# Patient Record
Sex: Male | Born: 1975 | Race: Black or African American | Hispanic: No | Marital: Married | Smoking: Never smoker
Health system: Southern US, Community
[De-identification: ages and names within clinical notes are randomized; demographics above are authoritative.]

## PROBLEM LIST (undated history)

## (undated) DIAGNOSIS — I209 Angina pectoris, unspecified: Secondary | ICD-10-CM

## (undated) DIAGNOSIS — M1990 Unspecified osteoarthritis, unspecified site: Secondary | ICD-10-CM

## (undated) DIAGNOSIS — R002 Palpitations: Secondary | ICD-10-CM

## (undated) DIAGNOSIS — I639 Cerebral infarction, unspecified: Secondary | ICD-10-CM

## (undated) DIAGNOSIS — G243 Spasmodic torticollis: Secondary | ICD-10-CM

## (undated) DIAGNOSIS — E785 Hyperlipidemia, unspecified: Secondary | ICD-10-CM

## (undated) DIAGNOSIS — I10 Essential (primary) hypertension: Secondary | ICD-10-CM

## (undated) HISTORY — DX: Spasmodic torticollis: G24.3

## (undated) HISTORY — PX: FRACTURE SURGERY: SHX138

## (undated) HISTORY — DX: Palpitations: R00.2

## (undated) HISTORY — DX: Angina pectoris, unspecified: I20.9

## (undated) HISTORY — DX: Essential (primary) hypertension: I10

## (undated) HISTORY — DX: Hyperlipidemia, unspecified: E78.5

## (undated) HISTORY — DX: Cerebral infarction, unspecified: I63.9

---

## 2000-01-03 ENCOUNTER — Encounter: Payer: Self-pay | Admitting: Emergency Medicine

## 2000-01-03 ENCOUNTER — Emergency Department (HOSPITAL_COMMUNITY): Admission: EM | Admit: 2000-01-03 | Discharge: 2000-01-03 | Payer: Self-pay | Admitting: Emergency Medicine

## 2002-08-02 ENCOUNTER — Ambulatory Visit (HOSPITAL_BASED_OUTPATIENT_CLINIC_OR_DEPARTMENT_OTHER): Admission: RE | Admit: 2002-08-02 | Discharge: 2002-08-02 | Payer: Self-pay | Admitting: Orthopedic Surgery

## 2002-10-08 ENCOUNTER — Emergency Department (HOSPITAL_COMMUNITY): Admission: EM | Admit: 2002-10-08 | Discharge: 2002-10-09 | Payer: Self-pay | Admitting: Emergency Medicine

## 2002-12-28 ENCOUNTER — Encounter: Payer: Self-pay | Admitting: Orthopedic Surgery

## 2002-12-28 ENCOUNTER — Ambulatory Visit (HOSPITAL_COMMUNITY): Admission: RE | Admit: 2002-12-28 | Discharge: 2002-12-28 | Payer: Self-pay | Admitting: Orthopedic Surgery

## 2003-02-06 ENCOUNTER — Ambulatory Visit (HOSPITAL_BASED_OUTPATIENT_CLINIC_OR_DEPARTMENT_OTHER): Admission: RE | Admit: 2003-02-06 | Discharge: 2003-02-06 | Payer: Self-pay | Admitting: Orthopedic Surgery

## 2005-05-01 ENCOUNTER — Emergency Department (HOSPITAL_COMMUNITY): Admission: EM | Admit: 2005-05-01 | Discharge: 2005-05-02 | Payer: Self-pay | Admitting: Emergency Medicine

## 2008-12-25 ENCOUNTER — Ambulatory Visit (HOSPITAL_COMMUNITY): Admission: RE | Admit: 2008-12-25 | Discharge: 2008-12-25 | Payer: Self-pay | Admitting: Orthopedic Surgery

## 2009-02-05 ENCOUNTER — Ambulatory Visit (HOSPITAL_BASED_OUTPATIENT_CLINIC_OR_DEPARTMENT_OTHER): Admission: RE | Admit: 2009-02-05 | Discharge: 2009-02-05 | Payer: Self-pay | Admitting: Orthopedic Surgery

## 2009-04-18 ENCOUNTER — Ambulatory Visit (HOSPITAL_BASED_OUTPATIENT_CLINIC_OR_DEPARTMENT_OTHER): Admission: RE | Admit: 2009-04-18 | Discharge: 2009-04-18 | Payer: Self-pay | Admitting: Orthopedic Surgery

## 2010-05-22 ENCOUNTER — Emergency Department (HOSPITAL_COMMUNITY)
Admission: EM | Admit: 2010-05-22 | Discharge: 2010-05-22 | Payer: Self-pay | Source: Home / Self Care | Admitting: Emergency Medicine

## 2010-06-17 ENCOUNTER — Ambulatory Visit: Admission: RE | Admit: 2010-06-17 | Payer: Self-pay | Source: Home / Self Care | Admitting: Orthopedic Surgery

## 2010-09-10 LAB — POCT HEMOGLOBIN-HEMACUE: Hemoglobin: 14.6 g/dL (ref 13.0–17.0)

## 2010-09-13 LAB — POCT HEMOGLOBIN-HEMACUE: Hemoglobin: 14.9 g/dL (ref 13.0–17.0)

## 2010-10-21 NOTE — Op Note (Signed)
NAME:  CORAL, SOLER               ACCOUNT NO.:  [id number]   MEDICAL RECORD NO.:  [id number]          PATIENT TYPE:  AMB   LOCATION:  DSC                          FACILITY:  [facility]   PHYSICIAN:  Katy Fitch. Sypher, M.D. DATE OF BIRTH:  February 28, 1976   DATE OF PROCEDURE:  02/05/2009  DATE OF DISCHARGE:                               OPERATIVE REPORT   PREOPERATIVE DIAGNOSES:  1. Loose body, right wrist volar aspect of lunate facet.  2. Chronic triangular fibrocartilage tear.  3. Chronic ulnocarpal abutment signs.  4. 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  5. Possible loose retained Kirschner wire in scaphoid.   POSTOPERATIVE DIAGNOSES:  1. Loose body, volar aspect of right wrist and lunate facet involving      volar radioulnar ligament.  2. 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  3. 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  4. No evidence of an accessible Kirschner wire based on arthroscopic      examination of proximal pole of the scaphoid; therefore, no effort      to remove the Kirschner wire was undertaken.   OPERATIONS:  1. Diagnostic arthroscopy, right wrist.  2. Arthroscopic debridement of loose body.  3. Arthroscopic debridement of triangular fibrocartilage and      ulnocarpal, radiocarpal synovitis.  4. Open Feldon resection of distal ulna with arthroscopic shaving and      burring as well as synovectomy of distal radioulnar joint.  5. Examination of scaphoid looking for retained Kirschner wire.  The      wire was covered with hyaline cartilage; therefore, no effort was      made to remove the wire from the scaphoid.   OPERATING SURGEON:  Katy Fitch. Sypher, MD   ASSISTANT:  Marveen Reeks Dasnoit, PA-C   ANESTHESIA:  General by LMA.   SUPERVISING ANESTHESIOLOGIST:  Janetta Hora. 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   He has a history of fracturing his right scaphoid in the  past undergoing open reduction and internal fixation with Kirschner wire  fixation.  He was noncompliant with postoperative splinting and  fractured one of his 3 Kirschner wires.  One is retained within the body  of the scaphoid.  He also is status post arthroscopic debridement of his  wrist with a arthroscopic Feldon shortening.   He did quite well for 6 years but this year developed acute wrist pain.  He was seen in consultation by Dr. Charlett Blake in [facility]  where he was noted to have a loose body in his lunate facet.  He was  referred back for an upper extremity orthopedic consult.  Clinical  examination revealed signs of internal derangement of the wrist.  Plain  films showed a loose body adjacent to the lunate facet of the distal  radius.  We advised him to undergo a CT scan in an effort to determine  the exact location of the loose body and whether or not he had signs  of  chronic ulnocarpal abutment.   He had cystic change in the triquetrum and uneven ulnar head despite  prior Feldon resection suggested that he did have residual abutment  causing osteophyte formation.  He also had mechanical symptoms  suggestive of scapholunate interosseous ligament tear.  We recommended  diagnostic arthroscopy, removal of loose body, and after the CT  suggested we might be able to access the Kirschner wire.  We offered  possible wire removal from the scaphoid.   After informed consent, he is brought to the operating room at this  time.   PROCEDURE:  Sandra Tellefsen. Morlock was brought to the operating room and  placed in a supine position on the operating table.   Following an anesthesia consult with Dr. Gelene Mink, general anesthesia  by LMA technique was recommended and accepted.  He was brought to room  #6 at [facility] and placed in supine position on the  operating table and under Dr. Thornton Dales direct  supervision, general  anesthesia by LMA technique induced.   The right arm was prepped with Betadine soap and solution, sterilely  draped.  Due to CEPHALOSPORIN allergy, 1 g of Ancef was administered as  IV prophylactic antibiotic.   Procedure commenced with exsanguination of the right arm with an Esmarch  bandage and inflation of the arterial tourniquet to 250 mmHg on the  proximal brachium.  The wrist was extracted and a tower designed for  wrist arthroscopy with metal finger traps on the index and the long  finger and countertraction on the forearm, 10-pound traction was  applied.  The wrist was sounded with an 18-gauge needle, distended with  10 mL sterile saline and a scope introduced through a standard 3-portal  dorsal portal.  Diagnostic arthroscopy revealed very uneven hyaline  cartilage on the scaphoid, dorsal lunate, and triquetrum.  There was a  barrier on the triquetrum due to chronic abutment versus the ulna.  The  triangular fibrocartilage had central degenerative tear.  The volar,  ulnar corner of the lunate facet of the radius was unstable and  represented the loose body.   This was invested in the volar radiocarpal ligaments and the volar  radioulnar ligament.   With great care, the loose body was outlined with a nerve hook followed  by use of a suction shaver and suction bur to remove the loose body.  This is a subtotal resection taking care to leave a rim of loose body to  protect the volar radioulnar ligament.  We lowered the profile of the  loose body well below the height of the articular surface of the lunate  facet removing about 4 mm of bone.  However, I was careful not to  completely detach the volar radioulnar ligament from the radius, as this  could lead to distal radioulnar joint instability.  The triangular  fibrocartilage was meticulously debrided with a basket forceps and  suction shaver and complete synovectomy of the joint accomplished.  The   hyaline articular cartilage surface of the proximal pole of the scaphoid  had grade 2 and 3 chondromalacia.  The lunate had grade 2  chondromalacia.  The scaphoid facet of the radius was pristine.  The  lunate facet of the radius had grade 2 chondromalacia.  We carefully  inspected the proximal pole of the scaphoid and could not identify the  Kirschner wire.  This appears to be covered with hyaline cartilage and  must be clinically stable.   In view of these findings,  there was no indication to proceed with an  arthrotomy to attempt to remove the Kirschner wire from my judgment, as  this has been in this position for a very prolonged period of time.   The distal ulna was not easily visualized through the triangular  fibrocartilage tear; therefore, we elected to perform an open distal  ulnar resection.  After removal of the arthroscopic equipment, the ulnar  head was exposed through a transverse incision directly over the site of  the previous Feldon resection.  The interval between the fifth and sixth  dorsal compartments was incised transversely and the capsule of the  distal radioulnar joint incised.  Suction shaver was used to debride  synovitis followed by use of arthroscopic bur to level the ulnar head  and removed another 3 mm.  This was done in 30-degree oblique angle to  be certain that there is no residual abutment.   Care was taken to preserve the foveal attachment of the triangular  fibrocartilage.  We carefully resected bone in a manner to prevent  residual abutment.   The C-arm fluoroscope was used to confirm near-complete resection of the  loose body and satisfactory decompression of the distal ulna.  A level  resection was achieved.   The wound was then lavaged with the scope followed by repair of the  capsule of the distal radioulnar joint with a mattress suture of 3-0  Ethibond followed by repair of the skin with subcutaneous suture of 4-0  Vicryl and intradermal  3-0 Prolene.   There were no apparent complications.  Mr. Farrington tolerated the surgery  and anesthesia well.  He was transferred to the recovery room in stable  signs.      Katy Fitch Sypher, M.D.  Electronically Signed     RVS/MEDQ  D:  02/05/2009  T:  02/06/2009  Job:  [id number]   cc:   Lunette Stands, M.D.

## 2010-10-24 NOTE — Op Note (Signed)
NAME:  Jesse Ho, OBRECHT NO.:  [id number]   MEDICAL RECORD NO.:  [id number]                   PATIENT TYPE:  AMB   LOCATION:  DSC                                  FACILITY:  [facility]   PHYSICIAN:  Feliberto Gottron. Turner Daniels, M.D.                DATE OF BIRTH:  02-06-1976   DATE OF PROCEDURE:  08/02/2002  DATE OF DISCHARGE:                                 OPERATIVE REPORT   PREOPERATIVE DIAGNOSIS:  Right wrist triangular fibrocartilage tear.   POSTOPERATIVE DIAGNOSIS:  Right wrist triangular fibrocartilage tear.   OPERATION:  Right wrist arthroscopic debridement of triangular  fibrocartilage tear and minimal chondromalacia of the junction between the  lunate and scaphoid facets of the distal radius.  The TFCC tear was central.  We also removed a retained suture from a prior peripheral repair that looked  like it was in good condition.   SURGEON:  Feliberto Gottron. Turner Daniels, M.D.   FIRST ASSISTANT:  Erskine Squibb B. Jannet Mantis.   ANESTHESIA:  General LMA   ESTIMATED BLOOD LOSS:  Minimal   FLUIDS REPLACED:  800 cc of crystalloid   DRAINS PLACED:  None   TOURNIQUET TIME:  None   INDICATIONS FOR PROCEDURE:  A 35 year old man who injured his right wrist at  work when he slammed it down on a counter and has ulnar sided every since.  An MRI scan was accomplished showing what they described as fenestrations of  the center of the TFCC which it turns out had previously been repaired.  He  also had a pinning of the scaphoid some years ago, although that side was  not symptomatic.  Because of persistent pain over the last few months and a  good temporary response to cortisone, he is taken for arthroscopic  evaluation and treatment of a presumed right wrist TFCC tear.   DESCRIPTION OF PROCEDURE:  The patient was identified by arm band, taken to  the operating room at [facility].  Appropriate anesthetic  monitors were attached and general LMA anesthesia induced with the  patient  in the supine position.  The right upper extremity was then prepped and  draped in the usual sterile fashion from the fingertips to just above the  elbow and the limb was then placed in the Clinitek wrist arthroscopy tower  with 10 pounds of traction.  Using a standard 3-4 portal, we infiltrated the  skin around the wrist joint and then made sure the needle easily went into  the radiocarpal joint.  Satisfied with the position of the portal, we went  ahead and entered the wrist joint with a #11 blade followed by the standard  cannula from the Clinitek wrist arthroscopy, so diagnostic arthroscopy did  reveal an obvious central tear of the TFCC.  A 6RU portal was then made with  a 22 gauge needle, followed by the 11 blade, allowing introduction of a  probe followed by a 2.9 Barracuda sucker shaver and we started debriding the  central tear of the TFCC.  We also used the small suction biters to get back  to a stable rim.  Once, we had gotten to a stable rim we saw an old Prolene  suture from a prior repair and we removed this using a supplemental dorsal  portal between the 6RU and the 3-4 portal.  We also found some  chondromalacia at the junction of the lunate and scaphoid facets and this  was debrided with a 2.0 gator sucker shaver as well.  Photographic  documentation was made of the debridement and removal of the suture as well  as the chondromalacia.  The wrist was washed out with normal saline solution  and the arthroscopic instruments removed.  A dressing of Xeroform, 4 x 4  dressing sponges, Webril and Coban was then applied.  The patient was  awakened and taken to the recovery room without difficulty.                                               Feliberto Gottron. Turner Daniels, M.D.    Ovid Curd  D:  08/02/2002  T:  08/02/2002  Job:  [id number]

## 2010-10-24 NOTE — Op Note (Signed)
NAME:  Jesse Ho, Jesse Ho                         ACCOUNT NO.:  [id number]   MEDICAL RECORD NO.:  [id number]                   PATIENT TYPE:  AMB   LOCATION:  DSC                                  FACILITY:  [facility]   PHYSICIAN:  Katy Fitch. Naaman Plummer., M.D.          DATE OF BIRTH:  1976-03-21   DATE OF PROCEDURE:  02/06/2003  DATE OF DISCHARGE:                                 OPERATIVE REPORT   PREOPERATIVE DIAGNOSIS:  Chronic ulnar-sided right wrist pain, status post  previous peripheral triangular fibrocartilage repair more than 10 years  prior, status post repetitive work with development of recurrent ulnocarpal  abutment signs, status post arthroscopic evaluation by Dr. Feliberto Gottron. Jesse Ho  with identification of recurrent triangular fibrocartilage tear.   POSTOPERATIVE DIAGNOSIS:  Chronic ulnar-sided right wrist pain, status post  previous peripheral triangular fibrocartilage repair more than 10 years  prior, status post repetitive work with development of recurrent ulnocarpal  abutment signs, status post arthroscopic evaluation by Dr. Feliberto Gottron. Jesse Ho  with identification of recurrent triangular fibrocartilage tear.   OPERATION:  1. Arthroscopic debridement of chondromalacia of lunate, distal radius, and     peripheral triangular fibrocartilage irregularity.  2. Removal of buried Prolene suture from prior triangular fibrocartilage     repair.  3. Three-millimeter Feldon ulnar shortening with reconstruction of distal     radioulnar joint capsule.   OPERATING SURGEON:  Katy Fitch. Sypher, M.D.   ASSISTANT:  Jonni Sanger, P.A.   ANESTHESIA:  General by LMA.   SUPERVISING ANESTHESIOLOGIST:  Janetta Hora. Gelene Mink, M.D.   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   Earlier in 2004, he developed pain in the ulnar aspect of his right wrist  and was referred for an orthopedic consult with Dr. Gean Birchwood.  Dr. [name]  examined his wrist  and identified a probable triangular fibrocartilage tear.  Jesse Ho had a remote injury to his wrist more than 10 years prior,  during which he had a scaphoid fracture and a peripheral triangular  fibrocartilage tear, both of which were repaired with anatomic results and  complete resolution of his discomfort.   He has subsequently worked in a Restaurant manager, fast food for many years and had  developed recurrent ulnar-sided wrist pain.   Dr. [name] saw him in the early spring of 2004, made the diagnosis of  triangular fibrocartilage tear and scoped his wrist, confirming the  triangular fibrocartilage tear.   Following debridement, Jesse Ho had persistent discomfort, therefore he  sought an upper extremity orthopedic consult with our office, as he is  familiar with our upper extremity expertise.   He was noted to have signs of chronic ulnocarpal abutment with sclerosis of  his lunate and triquetrum and a symptomatic buried Prolene suture on the  ulnar aspect of his wrist.   Plain films demonstrated that he was ulnar-positive, therefore, we scheduled  diagnostic  arthroscopy at this time, anticipating debridement of  chondromalacia, documentation of his abutment, followed by ulnar shortening  in the manner of Feldon.   He is brought to the operating room at this time.   PROCEDURE:  Vernel Langenderfer was brought to the operating room and placed on  supine position upon the operating table. Following induction of general  anesthesia by LMA, the right arm was prepped with Betadine soaping solution  and sterilely draped.  A pneumatic tourniquet was applied to the proximal  brachium.  Following exsanguination of the limb with an Esmarch bandage, the  arterial tourniquet was inflated to 220 mmHg.   Procedure commenced with distraction of the wrist with aid of a tower  designed for wrist arthroscopy.  Fingertraps were placed on the index and  long fingers and 10 pounds of traction applied with  contraction on the  forearm.  The wrist was distended with 5 mL of sterile saline, followed by  introduction of the arthroscopy through a standard 3.4 dorsal portal.   Diagnostic arthroscopy revealed chondromalacia in the lunate facet of the  distal radius.  There was normal-appearing synovial tissues along the  dorsoradial aspect of the scaphoid facet, normal hyaline cartilage on the  proximal pole of the scaphoid, normal hyaline cartilage on approximately 80%  of the lunate.  The lunotriquetral interosseous ligament region was intact,  with significant chondromalacia noted on the ulnar aspect of the lunate due  to chronic ulnocarpal abutment.  There was a degenerative central triangular  fibrocartilage tear adjacent to the sigmoid notch due to chronic abutment.  There was considerable synovitis in the ulnar aspect of the wrist and dorsal  aspect of the wrist overlying the triangular fibrocartilage.  A 6-R portal  was created and the suction shaver was used to debride the triangular  fibrocartilage, areas of chondromalacia and the peripheral triangular  fibrocartilage.   Previous traumatic injury to the peripheral triangular fibrocartilage had  completely healed without signs of recurrence.   The scope was then placed in the 6-R portal and the chondromalacia on the  lunate facet gently debrided, followed by a dorsal synovectomy overlying the  capsule directly adjacent to the lunate.  The chondromalacia on the dorsal  aspect of the lunate and in the lunate fossa will be graded grade 2 and 3.  The chondromalacia on the ulnar aspect of the lunate, due to abutment, will  be graded grade 4.   The arthroscopic equipment was removed and attention directed to removal of  the prior Prolene suture.   A short incision was fashioned directly over the palpable mass at the suture  site.  Subcutaneous tissues were carefully divided, taking care to spare the dorsoulnar sensory branch.  The Prolene  suture was identified and  circumferentially dissected with supplying sutures and removed.  The distal  ulna was then exposed through a transverse incision directly over the  palpable ulnar head.  Subcutaneous tissues were carefully divided, exposing  the extensor retinaculum between the 5th and 6th dorsal compartments.  The  retinaculum was split in the line of its fibers, followed by excision of a  portion of the capsule of the distal radioulnar joint.   Freer retractors were placed and an oscillating saw approximately 6 mm wide  was used to remove 3 mm of the ulnar head circumferentially.   A suction shaver and bur were then used to remove a portion of the ulnar  styloid and a portion of the ulnar head that had  hypertrophied on the palmar-  ulnar aspect of the head.   A C-arm fluoroscopic image was used with motion of the wrist to confirm that  the ulnar head was resected to an ulnar-minus position with proper  preservation of the styloid and triangular fibrocartilage attachment.   The distal radioulnar joint was thoroughly lavaged with steroid saline,  followed by repair of the capsule with interrupted sutures of 4-0 Vicryl,  followed by repair of the retinaculum with 4-0 Vicryl.  The wound was  repaired with intradermal 3-0 Prolene, as were the portals and suture-  removal wounds.   There were no apparent complications.   Mr. Maya tolerated surgery and anesthesia well.  He was transferred to  the recovery room with stable vital signs.   He will be discharged to the care of his family with prescriptions for  Percocet 5 mg one to two tablets p.o. q.4-6h. p.r.n. pain, also Levaquin 500  mg one p.o. daily x4 days as a prophylactic antibiotic and Motrin 600 mg one  p.o. q.6h. p.r.n. pain with food.                                               Katy Fitch Naaman Plummer., M.D.    RVS/MEDQ  D:  02/06/2003  T:  02/06/2003  Job:  [id number]

## 2011-02-21 ENCOUNTER — Emergency Department (HOSPITAL_COMMUNITY)
Admission: EM | Admit: 2011-02-21 | Discharge: 2011-02-21 | Disposition: A | Discharge status: Home / Self Care | Payer: Managed Care, Other (non HMO) | Attending: Emergency Medicine | Admitting: Emergency Medicine

## 2011-02-21 ENCOUNTER — Emergency Department (HOSPITAL_COMMUNITY): Payer: Managed Care, Other (non HMO)

## 2011-02-21 DIAGNOSIS — W230XXA Caught, crushed, jammed, or pinched between moving objects, initial encounter: Secondary | ICD-10-CM | POA: Insufficient documentation

## 2011-02-21 DIAGNOSIS — S6000XA Contusion of unspecified finger without damage to nail, initial encounter: Secondary | ICD-10-CM | POA: Insufficient documentation

## 2011-02-21 DIAGNOSIS — Y92009 Unspecified place in unspecified non-institutional (private) residence as the place of occurrence of the external cause: Secondary | ICD-10-CM | POA: Insufficient documentation

## 2011-02-21 DIAGNOSIS — S6990XA Unspecified injury of unspecified wrist, hand and finger(s), initial encounter: Secondary | ICD-10-CM | POA: Insufficient documentation

## 2011-02-21 DIAGNOSIS — S6710XA Crushing injury of unspecified finger(s), initial encounter: Secondary | ICD-10-CM | POA: Insufficient documentation

## 2013-02-15 ENCOUNTER — Emergency Department (HOSPITAL_COMMUNITY): Payer: Managed Care, Other (non HMO)

## 2013-02-15 ENCOUNTER — Encounter (HOSPITAL_COMMUNITY): Payer: Self-pay | Admitting: Emergency Medicine

## 2013-02-15 ENCOUNTER — Emergency Department (HOSPITAL_COMMUNITY)
Admission: EM | Admit: 2013-02-15 | Discharge: 2013-02-15 | Disposition: A | Discharge status: Home / Self Care | Payer: Managed Care, Other (non HMO) | Attending: Emergency Medicine | Admitting: Emergency Medicine

## 2013-02-15 DIAGNOSIS — Y9239 Other specified sports and athletic area as the place of occurrence of the external cause: Secondary | ICD-10-CM | POA: Insufficient documentation

## 2013-02-15 DIAGNOSIS — M2390 Unspecified internal derangement of unspecified knee: Secondary | ICD-10-CM | POA: Insufficient documentation

## 2013-02-15 DIAGNOSIS — S8000XA Contusion of unspecified knee, initial encounter: Secondary | ICD-10-CM | POA: Insufficient documentation

## 2013-02-15 DIAGNOSIS — Y9364 Activity, baseball: Secondary | ICD-10-CM | POA: Insufficient documentation

## 2013-02-15 DIAGNOSIS — M2392 Unspecified internal derangement of left knee: Secondary | ICD-10-CM

## 2013-02-15 DIAGNOSIS — X500XXA Overexertion from strenuous movement or load, initial encounter: Secondary | ICD-10-CM | POA: Insufficient documentation

## 2013-02-15 MED ORDER — IBUPROFEN 800 MG PO TABS
800.0000 mg | ORAL_TABLET | Freq: Once | ORAL | Status: AC
  Administered 2013-02-15: 800 mg via ORAL
  Filled 2013-02-15: qty 1

## 2013-02-15 MED ORDER — HYDROCODONE-ACETAMINOPHEN 5-325 MG PO TABS: 1.0000 | ORAL_TABLET | Freq: Four times a day (QID) | ORAL | Status: DC | PRN

## 2013-02-15 MED ORDER — IBUPROFEN 600 MG PO TABS: 600.0000 mg | ORAL_TABLET | Freq: Four times a day (QID) | ORAL | Status: DC | PRN

## 2013-02-15 NOTE — ED Provider Notes (Signed)
Medical screening examination/treatment/procedure(s) were performed by non-physician practitioner and as supervising physician I was immediately available for consultation/collaboration.  Daelan Gatt M Kenyah Luba, MD 02/15/13 0459 

## 2013-02-15 NOTE — ED Notes (Signed)
Pt dc to home. Pt sts understanding to dc instructions. Pt ambulatory to exit without difficulty. 

## 2013-02-15 NOTE — ED Provider Notes (Signed)
CSN: [id number]     Arrival date & time 02/15/13  0222 History   First MD Initiated Contact with Patient 02/15/13 0245     Chief Complaint  Patient presents with  . Knee Pain   (Consider location/radiation/quality/duration/timing/severity/associated sxs/prior Treatment) Patient is a 37 y.o. male presenting with knee pain. The history is provided by the patient.  Knee Pain Location:  Knee Time since incident:  4 hours Injury: yes   Knee location:  R knee Pain details:    Quality:  Aching   Radiates to:  Does not radiate   Severity:  Mild   Onset quality:  Sudden   Duration:  4 hours   Timing:  Constant Chronicity:  New Dislocation: no   Foreign body present:  No foreign bodies Prior injury to area:  No Relieved by:  None tried Worsened by:  Activity Ineffective treatments:  None tried Associated symptoms: stiffness   Associated symptoms: no fever and no numbness     History reviewed. No pertinent past medical history. Past Surgical History  Procedure Laterality Date  . Fracture surgery     No family history on file. History  Substance Use Topics  . Smoking status: Not on file  . Smokeless tobacco: Not on file  . Alcohol Use: Yes    Review of Systems  Unable to perform ROS Constitutional: Negative for fever.  Musculoskeletal: Positive for arthralgias and stiffness. Negative for joint swelling.  Skin: Negative for wound.  Neurological: Negative for weakness and numbness.  All other systems reviewed and are negative.    Allergies  Review of patient's allergies indicates no known allergies.  Home Medications   Current Outpatient Rx  Name  Route  Sig  Dispense  Refill  . HYDROcodone-acetaminophen (NORCO/VICODIN) 5-325 MG per tablet   Oral   Take 1 tablet by mouth every 6 (six) hours as needed for pain (severe pain).   10 tablet   0   . ibuprofen (ADVIL,MOTRIN) 600 MG tablet   Oral   Take 1 tablet (600 mg total) by mouth every 6 (six) hours as needed for  pain.   30 tablet   0    BP 142/90  Pulse 103  Temp(Src) 98.2 F (36.8 C) (Oral)  Resp 16  SpO2 98% Physical Exam  Nursing note and vitals reviewed. Constitutional: He is oriented to person, place, and time. He appears well-developed and well-nourished.  HENT:  Head: Normocephalic.  Neck: Normal range of motion.  Cardiovascular: Normal rate.   Pulmonary/Chest: Effort normal.  Musculoskeletal: He exhibits tenderness. He exhibits no edema.       Right knee: He exhibits ecchymosis. He exhibits normal range of motion, no swelling, no effusion, no deformity and no erythema. Tenderness found. Medial joint line tenderness noted.       Legs: Neurological: He is alert and oriented to person, place, and time.  Skin: Skin is warm.    ED Course  Procedures (including critical care time) Labs Review Labs Reviewed - No data to display Imaging Review Dg Knee Complete 4 Views Right  02/15/2013   *RADIOLOGY REPORT*  Clinical Data: Twisting injury to the right knee with medial pain.  RIGHT KNEE - COMPLETE 4+ VIEW  Comparison: MRI right knee 11/03/2008  Findings: There is an exostosis arising from the medial proximal tibial metaphysis.  Mild degenerative narrowing and hypertrophic changes in the medial compartment of the right knee.  Mild degenerative changes on the patella.  No evidence of acute fracture or subluxation.  No focal bone lesion or bone destruction.  Bone cortex and trabecular architecture appear intact.  No significant effusion.  IMPRESSION: No acute bony abnormalities demonstrated in the right knee. Minimal degenerative changes in the medial compartment.  Exostosis arising from the medial proximal tibial metaphysis.   Original Report Authenticated By: Burman Nieves, M.D.    MDM   1. Knee derangement syndrome, left     Placed in knee immobilizer and ortho referral     Arman Filter, NP 02/15/13 [phone]

## 2013-02-15 NOTE — ED Notes (Signed)
Pt st's was playing baseball and twisted right knee.  Pt c/o pain and swelling to knee

## 2013-02-15 NOTE — ED Notes (Signed)
Pt taken to radiology for x-ray.

## 2013-08-08 ENCOUNTER — Encounter (HOSPITAL_COMMUNITY): Payer: Self-pay | Admitting: Emergency Medicine

## 2013-08-08 ENCOUNTER — Emergency Department (HOSPITAL_COMMUNITY)
Admission: EM | Admit: 2013-08-08 | Discharge: 2013-08-08 | Disposition: A | Discharge status: Home / Self Care | Payer: Managed Care, Other (non HMO) | Attending: Emergency Medicine | Admitting: Emergency Medicine

## 2013-08-08 DIAGNOSIS — R519 Headache, unspecified: Secondary | ICD-10-CM

## 2013-08-08 DIAGNOSIS — I889 Nonspecific lymphadenitis, unspecified: Secondary | ICD-10-CM | POA: Insufficient documentation

## 2013-08-08 DIAGNOSIS — IMO0001 Reserved for inherently not codable concepts without codable children: Secondary | ICD-10-CM

## 2013-08-08 DIAGNOSIS — H9209 Otalgia, unspecified ear: Secondary | ICD-10-CM | POA: Insufficient documentation

## 2013-08-08 DIAGNOSIS — K029 Dental caries, unspecified: Secondary | ICD-10-CM

## 2013-08-08 DIAGNOSIS — R030 Elevated blood-pressure reading, without diagnosis of hypertension: Secondary | ICD-10-CM | POA: Insufficient documentation

## 2013-08-08 DIAGNOSIS — K002 Abnormalities of size and form of teeth: Secondary | ICD-10-CM | POA: Insufficient documentation

## 2013-08-08 DIAGNOSIS — R51 Headache: Secondary | ICD-10-CM | POA: Insufficient documentation

## 2013-08-08 MED ORDER — HYDROCODONE-ACETAMINOPHEN 5-325 MG PO TABS: ORAL_TABLET | ORAL | Status: DC

## 2013-08-08 MED ORDER — AMOXICILLIN-POT CLAVULANATE 875-125 MG PO TABS: 1.0000 | ORAL_TABLET | Freq: Two times a day (BID) | ORAL | Status: DC

## 2013-08-08 MED ORDER — MORPHINE SULFATE 4 MG/ML IJ SOLN: 4.0000 mg | Freq: Once | INTRAMUSCULAR | Status: DC

## 2013-08-08 NOTE — ED Provider Notes (Signed)
CSN: [id number]     Arrival date & time 08/08/13  1740 History   First MD Initiated Contact with Patient 08/08/13 1812     Chief Complaint  Patient presents with  . Sore Throat  . Headache     (Consider location/radiation/quality/duration/timing/severity/associated sxs/prior Treatment) HPI Pt is a 38yo male with hx of left sided facial pain a few months ago, c/o same today but assocaited with left sided neck mass and headache that started around 1pm this afternoon.  Pt states left sided facial pain and headache are constant, gradually worsening, throbbing, 10/10.  States he has never noticed a neck mass before. Denies tooth or throat pain. Denies fever, n/v/d. Denies difficulty breathing or swallowing. Has taken naproxen at home w/o relief.   History reviewed. No pertinent past medical history. Past Surgical History  Procedure Laterality Date  . Fracture surgery     History reviewed. No pertinent family history. History  Substance Use Topics  . Smoking status: Not on file  . Smokeless tobacco: Not on file  . Alcohol Use: Yes    Review of Systems  Constitutional: Negative for fever and chills.  HENT: Positive for dental problem, ear pain and facial swelling. Negative for congestion, mouth sores, sinus pressure, sore throat, trouble swallowing and voice change.   Eyes: Negative for photophobia, pain and visual disturbance.  Respiratory: Negative for shortness of breath.   Gastrointestinal: Negative for nausea, vomiting and diarrhea.  Neurological: Positive for headaches. Negative for dizziness and light-headedness.  All other systems reviewed and are negative.      Allergies  Review of patient's allergies indicates no known allergies.  Home Medications   Current Outpatient Rx  Name  Route  Sig  Dispense  Refill  . NAPROXEN PO   Oral   Take 1 tablet by mouth daily as needed (pain.).         Marland Kitchen amoxicillin-clavulanate (AUGMENTIN) 875-125 MG per tablet   Oral   Take 1  tablet by mouth every 12 (twelve) hours.   14 tablet   0   . HYDROcodone-acetaminophen (NORCO/VICODIN) 5-325 MG per tablet      Take 1-2 pills every 4-6 hours as needed for pain.   10 tablet   0    BP 159/94  Pulse 62  Temp(Src) 98.2 F (36.8 C) (Oral)  Resp 18  SpO2 98% Physical Exam  Nursing note and vitals reviewed. Constitutional: He is oriented to person, place, and time. He appears well-developed and well-nourished.  Appears uncomfortable, holding left side of face.  HENT:  Head: Normocephalic and atraumatic.  Right Ear: Hearing, tympanic membrane, external ear and ear canal normal.  Left Ear: Hearing, tympanic membrane, external ear and ear canal normal.  Nose: Nose normal.  Mouth/Throat: Uvula is midline, oropharynx is clear and moist and mucous membranes are normal. No trismus in the jaw. Abnormal dentition. Dental caries present. No dental abscesses or uvula swelling. No oropharyngeal exudate, posterior oropharyngeal edema, posterior oropharyngeal erythema or tonsillar abscesses.    Multiple dental caries, worse in left lower jaw. No obvious, focal, dental abscess.   Eyes: Conjunctivae are normal. No scleral icterus.  Neck: Normal range of motion. Neck supple. No JVD present. No tracheal deviation present.  Left anterior cervical adenopathy. Mass is tender and mobile, no fluctuance. Most consistent with swollen lymph node.  No nuchal rigidity or meningeal signs.    Cardiovascular: Normal rate, regular rhythm and normal heart sounds.   Pulmonary/Chest: Effort normal and breath sounds normal. No  stridor. No respiratory distress. He has no wheezes. He has no rales. He exhibits no tenderness.  Abdominal: Soft. Bowel sounds are normal. He exhibits no distension and no mass. There is no tenderness. There is no rebound and no guarding.  Musculoskeletal: Normal range of motion.  Lymphadenopathy:    He has cervical adenopathy ( left anterior).  Neurological: He is alert and  oriented to person, place, and time. No cranial nerve deficit. Coordination normal.  Skin: Skin is warm and dry.    ED Course  Procedures (including critical care time) Labs Review Labs Reviewed - No data to display Imaging Review No results found.   EKG Interpretation None      MDM   Final diagnoses:  Cervical lymphadenitis  Dental caries  Headache  Elevated blood pressure    Pt presenting with left sided facial pain, headache, and left sided neck mass.  On exam, pt appers uncomfortable but no respiratory distress or stridor. Left anterior cervical adenopathy present.  Lymph node is tender an mobile. No fluctuance consistent with abscess. Pt does have poor dental hygiene and multiple dental caries. This is likely cause of pt's pain and cervical adenopathy. Discussed pt with Dr. Fayrene FearingJames, will tx and refer to dentist.  Rx: vicodin and augmentin. May take ibuprofen as well for pain. F/u with Dr. Lucky CowboyKnox, DDS, call for appointment within 48 hours.  Pt also found to have elevated BP. Advised to f/u with PCP. Resource guide provided. Return precautions provided. Pt verbalized understanding and agreement with tx plan.     Junius Finnerrin O'Malley, PA-C 08/09/13 [id number]

## 2013-08-08 NOTE — Discharge Instructions (Signed)
Today you were evaluated for neck pain and headache associated with a swollen lymph node likely due to dental cavities and dental disease. You were also found to have elevated blood pressure of 167/103 today.  It is important to follow up with a primary care provider for recheck of blood pressure. If it does not improve as your pain improves, you may require medication to help lower your blood pressure. Refer to resource guide below for primary care and dentists.  Return to ER if you experience worsening symptoms including difficulty breathing, swallowing or develop fever that does not respond to over the count medications.   Emergency Department Resource Guide 1) Find a Doctor and Pay Out of Pocket Although you won't have to find out who is covered by your insurance plan, it is a good idea to ask around and get recommendations. You will then need to call the office and see if the doctor you have chosen will accept you as a new patient and what types of options they offer for patients who are self-pay. Some doctors offer discounts or will set up payment plans for their patients who do not have insurance, but you will need to ask so you aren't surprised when you get to your appointment.  2) Contact Your Local Health Department Not all health departments have doctors that can see patients for sick visits, but many do, so it is worth a call to see if yours does. If you don't know where your local health department is, you can check in your phone book. The CDC also has a tool to help you locate your state's health department, and many state websites also have listings of all of their local health departments.  3) Find a Walk-in Clinic If your illness is not likely to be very severe or complicated, you may want to try a walk in clinic. These are popping up all over the country in pharmacies, drugstores, and shopping centers. They're usually staffed by nurse practitioners or physician assistants that have been  trained to treat common illnesses and complaints. They're usually fairly quick and inexpensive. However, if you have serious medical issues or chronic medical problems, these are probably not your best option.  No Primary Care Doctor: - Call Health Connect at  [phone] - they can help you locate a primary care doctor that  accepts your insurance, provides certain services, etc. - Physician Referral Service- [phone]  Chronic Pain Problems: Organization         Address  Phone   Notes  [facility]  [phone] Patients need to be referred by their primary care doctor.   Medication Assistance: Organization         Address  Phone   Notes  [facility] [address] [place] [place], [place] [address] [phone] --Must be a resident of [facility] -- Must have NO insurance coverage whatsoever (no Medicaid/ Medicare, etc.) -- The pt. MUST have a primary care doctor that directs their care regularly and follows them in the community   [facility]  [phone]   [employer]  [phone]    Agencies that provide inexpensive medical care: Organization         Address  Phone                                                                            Notes  [facility]  [phone]   [facility]    [phone]   [facility] [address] [place], [place] [address] [phone]   [facility] [address], [place] [phone]   [facility]    [phone]) [phone]   [facility]    [phone]   [facility]  [address], [place] Phone:  [phone], Fax:  [phone] Hours of Operation:  9 am - 6 pm, M-F.  Also accepts Medicaid/Medicare and self-pay.  [facility]  [address], [place]  Phone: [phone](336) [phone], Fax: [phone]. Hours of Operation:  8:30 am - 5:30 pm, M-F.  Also accepts Medicaid and self-pay.  [facility] [place] [address], [place] Phone: [phone]   [facility] [address], [place], [place] [phone], [phone]. [phone] Mondays & Thursdays: 7-9 AM.  First 15 patients are seen on a first come, first serve basis.    Medicaid-accepting [facility] Providers:  Organization         Address                                                                       Phone                               Notes  [facility] [address], [place] [phone] Also accepts self-pay patients.  [facility] [address], [place]  [phone]   [facility] [address], [place] [phone]   [facility] [address] [place] [address], [place] [phone]) [phone]   Renaye RakersVeita Bland [address], [place]   [phone] Only accepts [place] Access [place] patients after they have their name applied to their card.   Self-Pay (no insurance) in [facility]:   Organization         Address                                                     Phone               Notes  Sickle Cell Patients, [facility] [address], [place] [phone]) [phone]   [facility] [address], [place] [phone]   [facility] [place]  [address], [place] [phone]   [facility]/Dr. Osei-Bonsu  [address], [place] or [address], [place] [phone] Phone number for both [place] and [place] locations is the same.  [facility] [address], [place] [phone]   [facility] [address] [place] or [address] [phone]   [facility] [address], [place] [phone], phone; [phone], fax Sees patients 1st and 3rd Saturday of every month.  Must not qualify for public or private insurance (i.e. Medicaid, Medicare, [place] Health Choice, Veterans' Benefits)  Household income should be no more than 200% of the poverty level The clinic cannot treat you if you are pregnant or think you are pregnant  Sexually transmitted diseases are not treated at the clinic.    Dental Care: Organization         Address                                  Phone                       Notes  [facility] Department of [facility] [address] [place], [place] [phone] Accepts children up to age 55 who are enrolled in [place] or [place] Health Choice; pregnant women with a Medicaid card; and children who have applied for Medicaid or [place] Health Choice, but were declined, whose parents can pay a reduced fee at time of service.  [facility] Department of [facility]  [address], [place] [phone] Accepts children up to age 6 who are enrolled in [place] or [place] Health Choice; pregnant women with a Medicaid card; and children who have applied for Medicaid or [place] Health Choice, but were declined, whose parents can pay a reduced fee at time of service.  [facility]  [address] [place], [place] [phone] Patients are seen by appointment only. Walk-ins are not accepted. [facility] will see patients 74 years of age and older. Monday - Tuesday (8am-5pm) Most Wednesdays (8:30-5pm) $30 per visit, cash only  [facility]  [address], [facility] [phone] Patients are seen by appointment only. Walk-ins are not accepted. [facility] will see patients 60 years of age and older. One Wednesday Evening (Monthly: Volunteer Based).  $30 per visit, cash only  [employer]  [phone] for adults; Children under age 2, call Graduate Pediatric Dentistry at [phone]. Children aged 24-14, please call [phone] to request a pediatric application.  Dental services are provided in all areas of dental care including fillings, crowns and bridges, complete and partial dentures, implants, gum treatment, root canals, and extractions. Preventive care is also provided. Treatment is provided to both adults and children. Patients are selected via a lottery and there is often a waiting list.   [facility] [address] [place]  [phone] [email or web address]   [facility] [address] [place], [place] [phone], Ext. [phone] Second and Fourth Thursday of each month, opens at 6:30 AM; Clinic ends at 9 AM.  Patients are seen on a first-come first-served basis, and a limited number are seen during each clinic.   [facility]  [address], [place], [place] [phone]) [phone]   Eligibility Requirements You must have lived in [place], [place], or [place] counties for at least the last three months.   You cannot be eligible for state or federal sponsored National Cityhealthcare insurance, including CIGNAVeterans Administration, [place]icaid, or [employer]icare.   You generally cannot be eligible for healthcare insurance through your employer.    How to apply: Eligibility screenings are held every Tuesday and Wednesday afternoon from 1:00 pm until 4:00 pm. You do not need an appointment for the interview!  [facility] [address], [place], [place] [phone]   [facility]  [phone]   [facility]  [phone]   [facility]  [phone]

## 2013-08-08 NOTE — ED Notes (Addendum)
Pt states he had sudden onset of L throat and face pain. No facial droop or injury noted.  Pt alert and oriented. Denies any symptoms prior to onset. No sob.

## 2013-08-16 NOTE — ED Provider Notes (Signed)
Medical screening examination/treatment/procedure(s) were performed by non-physician practitioner and as supervising physician I was immediately available for consultation/collaboration.   EKG Interpretation None        Rolland PorterMark Lenya Sterne, MD 08/16/13 [phone]

## 2013-09-25 ENCOUNTER — Encounter: Payer: Managed Care, Other (non HMO) | Attending: Family Medicine | Admitting: Dietician

## 2013-09-25 ENCOUNTER — Encounter: Payer: Self-pay | Admitting: Dietician

## 2013-09-25 VITALS — Ht 65.0 in | Wt 193.9 lb

## 2013-09-25 DIAGNOSIS — Z6832 Body mass index (BMI) 32.0-32.9, adult: Secondary | ICD-10-CM | POA: Insufficient documentation

## 2013-09-25 DIAGNOSIS — E669 Obesity, unspecified: Secondary | ICD-10-CM | POA: Insufficient documentation

## 2013-09-25 DIAGNOSIS — Z713 Dietary counseling and surveillance: Secondary | ICD-10-CM | POA: Insufficient documentation

## 2013-09-25 NOTE — Progress Notes (Signed)
  Medical Nutrition Therapy:  Appt start time: 1615 end time:  1653.   Assessment:  Primary concerns today: Jesse Ho is here today since his doctor referred him after his lab. Fasting glucose was 127 mg/dl though Hgb W0JA1c was 8.1%4.4%. LDL was 114 and HDL was 28.  After doctor's appointment in February made some changes such as going back to the gym and stopped eating fried foods, sweets, and eating a lot more salads and fish. Stopped drinking juice and sodas. Lost about 15 lbs since appointment. Would like to get his weight close to 160 lbs.  Jesse Ho works at [employer] and lives with his wife and kids. States that he shares the foods preparation with his wife.   Preferred Learning Style:   No preference indicated   Learning Readiness:   Ready  MEDICATIONS: none   DIETARY INTAKE:  24-hr recall:  B ( AM):cereal (Cheerios) with almond or eggs with avocado  Snk ( AM):none L ( PM): skips most of the time, may have a sandwich or protein bar 3-4 x week  Snk ( PM): none D ( PM): vegetable, Malaw[place]urkey or chicken  Snk ( PM): jello or wheat thins or carrots or cucumbers Beverages: water  Usual physical activity: goes to the gym 3-4 x week for 90 minutes, cardio and weights  Estimated energy needs: 2200 calories 248 g carbohydrates 165 g protein 61 g fat  Progress Towards Goal(s):  In progress.   Nutritional Diagnosis:  [place]-3.3 Overweight/obesity As related to hx of large consumption of fried foods, sweets, and sugar sweetened beverages.  As evidenced by BMI of 32.3 and fasting blood glucose of 127 mg/dl.     Intervention:  Nutrition counseling provided. Discussed the pathophysiology of insulin resistance and factors that affect blood glucose such as exercise, weight loss, and carbohydrate foods.   Teaching Method Utilized:  Visual Auditory Hands on  Handouts given during visit include:  Living Well with Diabetes  Yellow Card  MyPlate Handout  15 g CHO Snacks  Barriers to  learning/adherence to lifestyle change: none  Demonstrated degree of understanding via:  Teach Back   Monitoring/Evaluation:  Dietary intake, exercise, and body weight prn.

## 2013-09-25 NOTE — Patient Instructions (Signed)
Continue drinking mostly water.  Continue exercising 3-4 x week. Fill half of your plate with vegetables, quarter of your plate with protein, and a quarter with starch. Have protein with carbohydrates for snacks. Make sure to eat lunch or have a few snacks on hand if you are hungry in the middle of the day.  Continue limiting fat from saturated and trans fats (fats on meat, fried foods, and sweet).  Keep up the good work - healthy weight loss 0.5-2 lbs per week.

## 2014-08-01 ENCOUNTER — Other Ambulatory Visit: Payer: Self-pay | Admitting: *Deleted

## 2014-08-01 ENCOUNTER — Other Ambulatory Visit: Payer: Self-pay | Admitting: Family Medicine

## 2014-08-01 DIAGNOSIS — R221 Localized swelling, mass and lump, neck: Secondary | ICD-10-CM

## 2014-08-10 ENCOUNTER — Ambulatory Visit
Admission: RE | Admit: 2014-08-10 | Discharge: 2014-08-10 | Disposition: A | Discharge status: Home / Self Care | Payer: BLUE CROSS/BLUE SHIELD | Source: Ambulatory Visit | Attending: Family Medicine | Admitting: Family Medicine

## 2014-08-10 DIAGNOSIS — R221 Localized swelling, mass and lump, neck: Secondary | ICD-10-CM

## 2014-08-13 ENCOUNTER — Other Ambulatory Visit: Payer: Self-pay | Admitting: Family Medicine

## 2014-08-13 DIAGNOSIS — R220 Localized swelling, mass and lump, head: Secondary | ICD-10-CM

## 2014-08-13 DIAGNOSIS — R221 Localized swelling, mass and lump, neck: Principal | ICD-10-CM

## 2014-08-15 ENCOUNTER — Ambulatory Visit
Admission: RE | Admit: 2014-08-15 | Discharge: 2014-08-15 | Disposition: A | Discharge status: Home / Self Care | Payer: BLUE CROSS/BLUE SHIELD | Source: Ambulatory Visit | Attending: Family Medicine | Admitting: Family Medicine

## 2014-08-15 DIAGNOSIS — R220 Localized swelling, mass and lump, head: Secondary | ICD-10-CM

## 2014-08-15 DIAGNOSIS — R221 Localized swelling, mass and lump, neck: Principal | ICD-10-CM

## 2014-08-15 MED ORDER — IOPAMIDOL (ISOVUE-300) INJECTION 61%
75.0000 mL | Freq: Once | INTRAVENOUS | Status: AC | PRN
  Administered 2014-08-15: 75 mL via INTRAVENOUS

## 2014-08-17 ENCOUNTER — Other Ambulatory Visit: Payer: BLUE CROSS/BLUE SHIELD

## 2014-08-20 ENCOUNTER — Other Ambulatory Visit (HOSPITAL_COMMUNITY)
Admission: RE | Admit: 2014-08-20 | Discharge: 2014-08-20 | Disposition: A | Discharge status: Home / Self Care | Payer: BLUE CROSS/BLUE SHIELD | Source: Ambulatory Visit | Attending: Otolaryngology | Admitting: Otolaryngology

## 2014-08-20 ENCOUNTER — Other Ambulatory Visit: Payer: Self-pay | Admitting: Otolaryngology

## 2014-08-20 DIAGNOSIS — R221 Localized swelling, mass and lump, neck: Secondary | ICD-10-CM | POA: Insufficient documentation

## 2014-10-19 ENCOUNTER — Other Ambulatory Visit: Payer: Self-pay | Admitting: Otolaryngology

## 2014-10-19 HISTORY — PX: CYSTECTOMY: SUR359

## 2015-11-05 DIAGNOSIS — S60212A Contusion of left wrist, initial encounter: Secondary | ICD-10-CM | POA: Insufficient documentation

## 2016-04-16 ENCOUNTER — Encounter: Payer: Self-pay | Admitting: *Deleted

## 2016-04-17 ENCOUNTER — Ambulatory Visit (INDEPENDENT_AMBULATORY_CARE_PROVIDER_SITE_OTHER): Payer: BLUE CROSS/BLUE SHIELD | Admitting: Diagnostic Neuroimaging

## 2016-04-17 ENCOUNTER — Encounter: Payer: Self-pay | Admitting: Diagnostic Neuroimaging

## 2016-04-17 VITALS — BP 166/99 | HR 57 | Ht 66.0 in | Wt 208.0 lb

## 2016-04-17 DIAGNOSIS — Q180 Sinus, fistula and cyst of branchial cleft: Secondary | ICD-10-CM

## 2016-04-17 DIAGNOSIS — G243 Spasmodic torticollis: Secondary | ICD-10-CM | POA: Diagnosis not present

## 2016-04-17 DIAGNOSIS — M62838 Other muscle spasm: Secondary | ICD-10-CM

## 2016-04-17 NOTE — Progress Notes (Signed)
[facility]  PATIENT: Jesse PuntDavid G Chaney DOB: 02/09/1976  REFERRING CLINICIAN: Thacker HISTORY FROM: patient  REASON FOR VISIT: new consult    HISTORICAL  CHIEF COMPLAINT:  Chief Complaint  Patient presents with  . Rm 6  . New Patient (Initial Visit)  . Spasms    C/o recurring left-sided neck/jaw spasms over the past year. Pain not relieved by OTC pain relievers. Flexeril helps some w/ muscle soreness. 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  . Numbness    Reports ongoing numbness to left neck as well. Sometimes feels that he's "drooling from left-side of face but there's nothing there."    HISTORY OF PRESENT ILLNESS:   40 year old right-handed male here for evaluation of numbness on the left face and neck and left neck spasms. 2015 patient had left neck mass/cyst. In May 2016 this was surgically treated and diagnosed with branchial cleft cyst. Immediately following surgery patient noticed numbness in his left lower face and left neck. Since that time numbness has been stable. However he has intermittent episodes of severe muscle spasm and pain when he yawns, turns his head to the right or exerts himself. Spasms can occur 2 times per week up to 2-3 times per day. He has tried muscle relaxers without relief. Patient has followed up with ENT as well.    REVIEW OF SYSTEMS: Full 14 system review of systems performed and negative with exception of: Numbness.  ALLERGIES: No Known Allergies  HOME MEDICATIONS: Outpatient Medications Prior to Visit  Medication Sig Dispense Refill  . amoxicillin-clavulanate (AUGMENTIN) 875-125 MG per tablet Take 1 tablet by mouth every 12 (twelve) hours. 14 tablet 0  . HYDROcodone-acetaminophen (NORCO/VICODIN) 5-325 MG per tablet Take 1-2 pills every 4-6 hours as needed for pain. 10 tablet 0  . NAPROXEN PO Take 1 tablet by mouth daily as needed (pain.).     No facility-administered medications prior to visit.     PAST MEDICAL  HISTORY: Past Medical History:  Diagnosis Date  . Hyperlipidemia     PAST SURGICAL HISTORY: Past Surgical History:  Procedure Laterality Date  . CYSTECTOMY Left 10/19/2014   left branchial cleft cyst removed  . FRACTURE SURGERY     fx fingers on left hand playing football    FAMILY HISTORY: Family History  Problem Relation Age of Onset  . Hyperlipidemia Other   . Hypertension Other   . Diabetes Other   . Obesity Other   . Diabetes Sister     SOCIAL HISTORY:  Social History   Social History  . Marital status: Married    Spouse name: Lowella Bandyikki  . Number of children: 2  . Years of education: 12+   Occupational History  .      [employer], designs websites   Social History Main Topics  . Smoking status: Never Smoker  . Smokeless tobacco: Never Used  . Alcohol use Yes  . Drug use: No  . Sexual activity: Not on file   Other Topics Concern  . Not on file   Social History Narrative   Lives at home w/ his wife and children   Right-handed   Caffeine: 3 cups of coffee/tea     PHYSICAL EXAM  GENERAL EXAM/CONSTITUTIONAL: Vitals:  Vitals:   04/17/16 1058  BP: (!) 166/99  Pulse: (!) 57  Weight: 208 lb (94.3 kg)  Height: 5\' 6"  (1.676 m)     Body mass index is 33.57 kg/m.  No exam data present  Patient is in  no distress; well developed, nourished and groomed; neck is supple  HEAD SLIGHTLY ROTATED TO THE RIGHT, TILTED TO THE LEFT AND BACK; RIGHT SHOULDER ELEVATED AND LEFT SHOULDER DEPRESSED  LEFT SCM SPASM AND TENDERNESS  CARDIOVASCULAR:  Examination of carotid arteries is normal; no carotid bruits  Regular rate and rhythm, no murmurs  Examination of peripheral vascular system by observation and palpation is normal  EYES:  Ophthalmoscopic exam of optic discs and posterior segments is normal; no papilledema or hemorrhages  MUSCULOSKELETAL:  Gait, strength, tone, movements noted in Neurologic exam below  NEUROLOGIC: MENTAL STATUS:  No flowsheet  data found.  awake, alert, oriented to person, place and time  recent and remote memory intact  normal attention and concentration  language fluent, comprehension intact, naming intact,   fund of knowledge appropriate  CRANIAL NERVE:   2nd - no papilledema on fundoscopic exam  2nd, 3rd, 4th, 6th - pupils equal and reactive to light, visual fields full to confrontation, extraocular muscles intact, no nystagmus  5th - facial sensation --> DECR IN LEFT V2 AND V3  7th - facial strength symmetric  8th - hearing intact  9th - palate elevates symmetrically, uvula midline  11th - shoulder shrug symmetric  12th - tongue protrusion midline  MOTOR:   normal bulk and tone, full strength in the BUE, BLE  SENSORY:   normal and symmetric to light touch, temperature, vibration  COORDINATION:   finger-nose-finger, fine finger movements normal  REFLEXES:   deep tendon reflexes present and symmetric  GAIT/STATION:   narrow based gait; able to walk tandem; romberg is negative    DIAGNOSTIC DATA (LABS, IMAGING, TESTING) - I reviewed patient records, labs, notes, testing and imaging myself where available.  Lab Results  Component Value Date   HGB 14.6 04/18/2009   No results found for: NA, K, CL, CO2, GLUCOSE, BUN, CREATININE, CALCIUM, PROT, ALBUMIN, AST, ALT, ALKPHOS, BILITOT, GFRNONAA, GFRAA No results found for: CHOL, HDL, LDLCALC, LDLDIRECT, TRIG, CHOLHDL No results found for: ZOXW9UHGBA1C No results found for: VITAMINB12 No results found for: TSH  08/15/14 CT soft tissue neck [I reviewed images myself and agree with interpretation. -VRP]  - 3.6 x 3.6 x 2.8 cm well-circumscribed thin walled cyst in the left neck highly likely to represent a branchial cleft cyst. - The differential diagnosis does include cystic necrotic lymph node, but that is felt much less likely.    ASSESSMENT AND PLAN  40 y.o. 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 Cervical dystonia symptoms are painful and interfering with quality of life. Patient has tried and failed muscle relaxers for an adequate trial. I reviewed the patient with Dr. Terrace ArabiaYan who also agrees that patient may be candidate for chemodenervation.   Dx:  1. Muscle spasm   2. Cervical dystonia   3. Branchial cleft cyst     PLAN: - reviewed diagnosis and options for treatment - refer to Dr. Terrace ArabiaYan for botulinum toxin injection for cervical dystonia  Return refer to Dr. Terrace ArabiaYan for botox / chemodenervation treatment.    Suanne MarkerVIKRAM R. PENUMALLI, MD 04/17/2016, 11:35 AM Certified in Neurology, Neurophysiology and Neuroimaging  [facility] [address], [address] [place], [place] [address] [phone]

## 2016-04-17 NOTE — Patient Instructions (Signed)
Thank you for coming to see [place] at [facility]. I hope we have been able to provide you high quality care today.  You may receive a patient satisfaction survey over the next few weeks. We would appreciate your feedback and comments so that we may continue to improve ourselves and the health of our patients.  - continue cyclobenzaprine 36m as needed   - consider physical therapy or massage therapy    ~~~~~~~~~~~~~~~~~~~~~~~~~~~~~~~~~~~~~~~~~~~~~~~~~~~~~~~~~~~~~~~~~  DR. Janasia Coverdale'S GUIDE TO HAPPY AND HEALTHY LIVING These are some of my general health and wellness recommendations. Some of them may apply to you better than others. Please use common sense as you try these suggestions and feel free to ask me any questions.   ACTIVITY/FITNESS Mental, social, emotional and physical stimulation are very important for brain and body health. Try learning a new activity (arts, music, language, sports, games).  Keep moving your body to the best of your abilities. You can do this at home, inside or outside, the park, community center, gym or anywhere you like. Consider a physical therapist or personal trainer to get started. Consider the app Sworkit. Fitness trackers such as smart-watches, smart-phones or Fitbits can help as well.   NUTRITION Eat more plants: colorful vegetables, nuts, seeds and berries.  Eat less sugar, salt, preservatives and processed foods.  Avoid toxins such as cigarettes and alcohol.  Drink water when you are thirsty. Warm water with a slice of lemon is an excellent morning drink to start the day.  Consider these websites for more information The Nutrition Source (hhttps://www.henry-hernandez.biz/ [employer] Nutrition (wWindowBlog.ch   RELAXATION Consider practicing mindfulness meditation or other relaxation techniques such as deep breathing, prayer, yoga, tai chi, massage. See website mindful.org or the apps  Headspace or Calm to help get started.   SLEEP Try to get at least 7-8+ hours sleep per day. Regular exercise and reduced caffeine will help you sleep better. Practice good sleep hygeine techniques. See website sleep.org for more information.   PLANNING Prepare estate planning, living will, healthcare POA documents. Sometimes this is best planned with the help of an attorney. Theconversationproject.org and agingwithdignity.org are excellent resources.

## 2016-04-28 ENCOUNTER — Telehealth: Payer: Self-pay | Admitting: Diagnostic Neuroimaging

## 2016-04-28 NOTE — Telephone Encounter (Signed)
Spoke with patient and scheduled apt with Dr. Terrace ArabiaYan.  Dr. Terrace ArabiaYan, do you want this to be a consultation or will you be injecting him during this apt also?

## 2016-04-28 NOTE — Telephone Encounter (Signed)
Called patient to make a consultation apt for botox with Dr. Terrace ArabiaYan. Patient did not answer so I left a VM asking him to call back.

## 2016-05-04 NOTE — Telephone Encounter (Signed)
Noted, I will make it a consultation apt.

## 2016-05-20 ENCOUNTER — Encounter: Payer: Self-pay | Admitting: Neurology

## 2016-05-20 ENCOUNTER — Ambulatory Visit (INDEPENDENT_AMBULATORY_CARE_PROVIDER_SITE_OTHER): Payer: BLUE CROSS/BLUE SHIELD | Admitting: Neurology

## 2016-05-20 DIAGNOSIS — G243 Spasmodic torticollis: Secondary | ICD-10-CM

## 2016-05-20 NOTE — Progress Notes (Signed)
PATIENT: Jesse PuntDavid G Ho DOB: 11/30/1975  Chief Complaint  Patient presents with  . Cervical Dystonia    He is here to discuss treatment with Xeomin.     HISTORICAL  Jesse Ho is a 40 years old right-handed male, referred by my colleague Dr. Marjory LiesPenumalli for evaluation of EMG guided botulism toxin A injection for abnormal neck posturing, left-sided neck pain, his primary care physician is Dr. 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  He had surgical resection in May 2016, had persistent numbness of left neck, left chin since, also gradually develop left neck, left upper trapezius area tenderness, achy pain, difficulty turning towards the right side, as it there is a tape attached to his left neck if he is turning towards the right side, he denies radiating pain to bilateral shoulder and arm.  His symptoms have gradually getting worse, was noted to has mild abnormal posturing, mild right shoulder elevation, left tilt, slight retrocollis. He is referred for potential EMG guided botulism toxin injection,  REVIEW OF SYSTEMS: Full 14 system review of systems performed and notable only for as above  ALLERGIES: No Known Allergies  HOME MEDICATIONS: Current Outpatient Prescriptions  Medication Sig Dispense Refill  . cyclobenzaprine (FLEXERIL) 5 MG tablet      No current facility-administered medications for this visit.     PAST MEDICAL HISTORY: Past Medical History:  Diagnosis Date  . Hyperlipidemia     PAST SURGICAL HISTORY: Past Surgical History:  Procedure Laterality Date  . CYSTECTOMY Left 10/19/2014   left branchial cleft cyst removed  . FRACTURE SURGERY     fx fingers on left hand playing football    FAMILY  HISTORY: Family History  Problem Relation Age of Onset  . Hyperlipidemia Other   . Hypertension Other   . Diabetes Other   . Obesity Other   . Diabetes Sister     SOCIAL HISTORY:  Social History   Social History  . Marital status: Married    Spouse name: Lowella Bandyikki  . Number of children: 2  . Years of education: 12+   Occupational History  .      [employer], designs websites   Social History Main Topics  . Smoking status: Never Smoker  . Smokeless tobacco: Never Used  . Alcohol use Yes  . Drug use: No  . Sexual activity: Not on file   Other Topics Concern  . Not on file   Social History Narrative   Lives at home w/ his wife and children   Right-handed   Caffeine: 3 cups of coffee/tea     PHYSICAL EXAM   Vitals:   05/20/16 1511  BP: (!) 142/91  Pulse: (!) 56  Weight: 206 lb (93.4 kg)  Height: 5\' 6"  (1.676 m)    Not recorded      Body mass index is 33.25 kg/m.  PHYSICAL EXAMNIATION:  Gen: NAD, conversant, well nourised, obese, well groomed                     Cardiovascular: Regular rate rhythm, no peripheral edema, warm, nontender. Eyes: Conjunctivae clear without exudates or hemorrhage Neck: Supple, no carotid bruits. Pulmonary: Clear to auscultation bilaterally   NEUROLOGICAL EXAM:  MENTAL STATUS: He has mild right shoulder  elevation, left tilt, slight retrocollis,  tightness at left upper trapezia muscles with deep palpitation  Speech:    Speech is normal; fluent and spontaneous with normal comprehension.  Cognition:     Orientation to time, place and person     Normal recent and remote memory     Normal Attention span and concentration     Normal Language, naming, repeating,spontaneous speech     Fund of knowledge   CRANIAL NERVES: CN II: Visual fields are full to confrontation. Fundoscopic exam is normal with sharp discs and no vascular changes. Pupils are round equal and briskly reactive to light. CN III, IV, VI: extraocular movement are  normal . No ptosis. CN V: Facial sensation is intact to pinprick in all 3 divisions bilaterally. Corneal responses are intact.  CN VII: Face is symmetric with normal eye closure and smile. CN VIII: Hearing is normal to rubbing fingers CN IX, X: Palate elevates symmetrically. Phonation is normal. CN XI: Head turning and shoulder shrug are intact CN XII: Tongue is midline with normal movements and no atrophy.  MOTOR: There is no pronator drift of out-stretched arms. Muscle bulk and tone are normal. Muscle strength is normal.  REFLEXES: Reflexes are 2+ and symmetric at the biceps, triceps, knees, and ankles. Plantar responses are flexor.  SENSORY: Intact to light touch, pinprick, positional sensation and vibratory sensation are intact in fingers and toes.  COORDINATION: Rapid alternating movements and fine finger movements are intact. There is no dysmetria on finger-to-nose and heel-knee-shin.    GAIT/STANCE: Posture is normal. Gait is steady with normal steps, base, arm swing, and turning. Heel and toe walking are normal. Tandem gait is normal.  Romberg is absent.   DIAGNOSTIC DATA (LABS, IMAGING, TESTING) - I reviewed patient records, labs, notes, testing and imaging myself where available.   ASSESSMENT AND PLAN  Jesse Ho is a 40 y.o. male   Cervical dystonia  EMG guided xeomin injection, ask for 200 units  Repeat CT cervical soft tissue w/wo   Levert FeinsteinYijun Hyrum Shaneyfelt, M.D. Ph.D.  [facility] [address], [address] [place], [place] [address] Ph: [phone]) [phone] Fax: [phone]  CC: Referring Provider

## 2016-06-03 ENCOUNTER — Other Ambulatory Visit: Payer: Self-pay | Admitting: *Deleted

## 2016-06-03 ENCOUNTER — Inpatient Hospital Stay: Admission: RE | Admit: 2016-06-03 | Payer: BLUE CROSS/BLUE SHIELD | Source: Ambulatory Visit

## 2016-06-03 ENCOUNTER — Telehealth: Payer: Self-pay | Admitting: *Deleted

## 2016-06-03 DIAGNOSIS — M62838 Other muscle spasm: Secondary | ICD-10-CM

## 2016-06-03 DIAGNOSIS — G243 Spasmodic torticollis: Secondary | ICD-10-CM

## 2016-06-03 DIAGNOSIS — M542 Cervicalgia: Secondary | ICD-10-CM

## 2016-06-03 NOTE — Telephone Encounter (Signed)
Carollee [facility]Shannon from [facility] called - requested CT neck w/wo contrast be changed to CT neck w/ contrast.  Ok, per Dr. Terrace ArabiaYan, to change.  Unable to remove previous order because test has already been scheduled.  New order placed for requested scan.

## 2016-06-03 NOTE — Telephone Encounter (Signed)
Order sent to GI  °

## 2016-06-05 ENCOUNTER — Ambulatory Visit
Admission: RE | Admit: 2016-06-05 | Discharge: 2016-06-05 | Disposition: A | Discharge status: Home / Self Care | Payer: BLUE CROSS/BLUE SHIELD | Source: Ambulatory Visit | Attending: Neurology | Admitting: Neurology

## 2016-06-05 DIAGNOSIS — M62838 Other muscle spasm: Secondary | ICD-10-CM

## 2016-06-05 DIAGNOSIS — G243 Spasmodic torticollis: Secondary | ICD-10-CM

## 2016-06-05 DIAGNOSIS — M542 Cervicalgia: Secondary | ICD-10-CM

## 2016-06-05 MED ORDER — IOPAMIDOL (ISOVUE-300) INJECTION 61%
75.0000 mL | Freq: Once | INTRAVENOUS | Status: DC | PRN
Start: 2016-06-05 — End: 2016-06-06

## 2016-06-24 ENCOUNTER — Telehealth: Payer: Self-pay | Admitting: *Deleted

## 2016-06-24 NOTE — Telephone Encounter (Signed)
Called patient twice this evening - left voicemail informing him our office will be closed on 06/25/16 due to snow.  I also attempted him on his wife's number (she is his HIPAA) - able to speak with her on the phone. Patient was with her and he is aware that his appt will need to be changed.  Offered to reschedule him while on the phone but he wanted to call back when he had his schedule availability.  His appt was for a Xeomin injection - he will ask for Danielle.

## 2016-06-25 ENCOUNTER — Ambulatory Visit: Payer: BLUE CROSS/BLUE SHIELD | Admitting: Neurology

## 2016-08-19 DIAGNOSIS — E782 Mixed hyperlipidemia: Secondary | ICD-10-CM | POA: Diagnosis not present

## 2016-08-19 DIAGNOSIS — E559 Vitamin D deficiency, unspecified: Secondary | ICD-10-CM | POA: Diagnosis not present

## 2016-08-19 DIAGNOSIS — R635 Abnormal weight gain: Secondary | ICD-10-CM | POA: Diagnosis not present

## 2016-08-19 DIAGNOSIS — E119 Type 2 diabetes mellitus without complications: Secondary | ICD-10-CM | POA: Diagnosis not present

## 2016-09-09 ENCOUNTER — Ambulatory Visit: Payer: BLUE CROSS/BLUE SHIELD | Admitting: Neurology

## 2016-09-09 ENCOUNTER — Telehealth: Payer: Self-pay | Admitting: Neurology

## 2016-09-09 NOTE — Telephone Encounter (Signed)
I called the patient to let him know his apt had been cancelled because he had not given consent for medication. He did not answer.  I called his wifes phone (listed on his DPR) it was disconnected.

## 2016-09-19 DIAGNOSIS — R635 Abnormal weight gain: Secondary | ICD-10-CM | POA: Diagnosis not present

## 2016-09-19 DIAGNOSIS — K5900 Constipation, unspecified: Secondary | ICD-10-CM | POA: Diagnosis not present

## 2016-09-19 DIAGNOSIS — E559 Vitamin D deficiency, unspecified: Secondary | ICD-10-CM | POA: Diagnosis not present

## 2016-09-19 DIAGNOSIS — E119 Type 2 diabetes mellitus without complications: Secondary | ICD-10-CM | POA: Diagnosis not present

## 2016-09-22 ENCOUNTER — Telehealth: Payer: Self-pay | Admitting: Neurology

## 2016-09-22 NOTE — Telephone Encounter (Signed)
Pt is wanting to reschedule the injection as soon as possible

## 2016-09-29 NOTE — Telephone Encounter (Signed)
I called and spoke with the patient regarding his injections. I informed him that the pharmacy is still waiting on his consent to ship the medication to our office. We scheduled his injection.

## 2016-10-06 NOTE — Telephone Encounter (Signed)
I called the pharmacy to check status of medication. They are still waiting on patient consent. I called the patient back again and asked him to call the pharmacy. He stated "I will give them a call" I told him that he would need to do so before 12 today so that we could get the medication by his ap time tomorrow.

## 2016-10-07 ENCOUNTER — Ambulatory Visit: Payer: BLUE CROSS/BLUE SHIELD | Admitting: Neurology

## 2016-10-07 NOTE — Telephone Encounter (Signed)
I spoke with the patient today regarding his apt. This is the third time he has cancelled his injection. The medication has still not been paid for, he wants to know if we can get him scheduled. I wanted to run this by you previous to scheduling. Please advise.

## 2016-10-19 ENCOUNTER — Observation Stay (HOSPITAL_COMMUNITY)
Admission: EM | Admit: 2016-10-19 | Discharge: 2016-10-20 | Disposition: A | Discharge status: Home / Self Care | Payer: BLUE CROSS/BLUE SHIELD | Attending: Internal Medicine | Admitting: Internal Medicine

## 2016-10-19 ENCOUNTER — Encounter (HOSPITAL_COMMUNITY): Payer: Self-pay | Admitting: Emergency Medicine

## 2016-10-19 ENCOUNTER — Emergency Department (HOSPITAL_COMMUNITY): Payer: BLUE CROSS/BLUE SHIELD

## 2016-10-19 DIAGNOSIS — R079 Chest pain, unspecified: Secondary | ICD-10-CM | POA: Insufficient documentation

## 2016-10-19 DIAGNOSIS — I071 Rheumatic tricuspid insufficiency: Secondary | ICD-10-CM | POA: Insufficient documentation

## 2016-10-19 DIAGNOSIS — G458 Other transient cerebral ischemic attacks and related syndromes: Secondary | ICD-10-CM

## 2016-10-19 DIAGNOSIS — G249 Dystonia, unspecified: Secondary | ICD-10-CM | POA: Diagnosis not present

## 2016-10-19 DIAGNOSIS — I10 Essential (primary) hypertension: Secondary | ICD-10-CM | POA: Insufficient documentation

## 2016-10-19 DIAGNOSIS — R200 Anesthesia of skin: Principal | ICD-10-CM | POA: Diagnosis present

## 2016-10-19 DIAGNOSIS — R531 Weakness: Secondary | ICD-10-CM | POA: Diagnosis not present

## 2016-10-19 DIAGNOSIS — R51 Headache: Secondary | ICD-10-CM | POA: Diagnosis not present

## 2016-10-19 DIAGNOSIS — G459 Transient cerebral ischemic attack, unspecified: Secondary | ICD-10-CM

## 2016-10-19 DIAGNOSIS — G243 Spasmodic torticollis: Secondary | ICD-10-CM

## 2016-10-19 LAB — CBC
HCT: 43.1 % (ref 39.0–52.0)
HEMOGLOBIN: 14.6 g/dL (ref 13.0–17.0)
MCH: 30.6 pg (ref 26.0–34.0)
MCHC: 33.9 g/dL (ref 30.0–36.0)
MCV: 90.4 fL (ref 78.0–100.0)
Platelets: 259 10*3/uL (ref 150–400)
RBC: 4.77 MIL/uL (ref 4.22–5.81)
RDW: 13.6 % (ref 11.5–15.5)
WBC: 6 10*3/uL (ref 4.0–10.5)

## 2016-10-19 LAB — BASIC METABOLIC PANEL
ANION GAP: 7 (ref 5–15)
BUN: 8 mg/dL (ref 6–20)
CHLORIDE: 104 mmol/L (ref 101–111)
CO2: 28 mmol/L (ref 22–32)
Calcium: 9.4 mg/dL (ref 8.9–10.3)
Creatinine, Ser: 1.1 mg/dL (ref 0.61–1.24)
GFR calc Af Amer: >60 mL/min (ref >60)
GFR calc non Af Amer: >60 mL/min (ref >60)
GLUCOSE: 95 mg/dL (ref 65–99)
POTASSIUM: 3.9 mmol/L (ref 3.5–5.1)
Sodium: 139 mmol/L (ref 135–145)

## 2016-10-19 LAB — I-STAT TROPONIN, ED: Troponin i, poc: 0 ng/mL (ref 0.00–0.08)

## 2016-10-19 NOTE — ED Triage Notes (Signed)
Pt presents to ED for assessment of left sided chest pain starting today and worsening through out the day.  Pt c/o increased pain with expiration.  C/o "that feeling you get in your chest after you run".  Denies cough or congestion recently.  C/o left arm numbness, as well as SOB.  Denies other associated symptoms.

## 2016-10-19 NOTE — ED Provider Notes (Signed)
[facility]-EMERGENCY DEPT Provider Note   CSN: [id number] Arrival date & time: 10/19/16  1837 By signing my name below, I, Bridgette HabermannMaria Tan, attest that this documentation has been prepared under the direction and in the presence of Zadie RhineWickline, Tyrie Porzio, MD. Electronically Signed: Bridgette HabermannMaria Tan, ED Scribe. 10/20/16. 12:06 AM.  History   Chief Complaint Chief Complaint  Patient presents with  . Chest Pain    HPI The history is provided by the patient. No language interpreter was used.   HPI Comments: Jesse Ho is a 41 y.o. male with h/o HLD, who presents to the Emergency Department complaining of intermittent left arm numbness and weakness beginning at ~5:30 pm yesterday. Pt also has associated shortness of breath, left leg paresthesias, mild left facial numbness, back pain, and chest pain and tightness. Chest pain is throbbing and sharp in quality. He states he feels as though he "had been running". Pt notes he was at work at the time of onset. He reports the initial episode was the worst and it lasted ~45 minutes. He states that pain seems to worsen with breathing out and palpation. Pt is not a smoker. No h/o similar symptoms. No h/o cardiac disease. Denies h/o DVT/PE. Pt further denies fever, chills, diaphoresis, neck pain, abdominal pain, nausea, vomiting, blurred vision, or any other associated symptoms.  Per chart review, pt has been followed by a neurologist for left-sided neck pain and numbness following removal of left cervical brachial cleft cyst in May 2016. He had a CT done for his symptoms which was unremarkable. States his symptoms at this time do not feel similar to his neck numbness nor does he think this is related.   PCP: Henrine Screwshacker, Robert, MD Neurologist: Levert FeinsteinYan, Yijun, MD   Past Medical History:  Diagnosis Date  . Hyperlipidemia     Patient Active Problem List   Diagnosis Date Noted  . Cervical dystonia 05/20/2016    Past Surgical History:  Procedure Laterality Date  . CYSTECTOMY  Left 10/19/2014   left branchial cleft cyst removed  . FRACTURE SURGERY     fx fingers on left hand playing football       Home Medications    Prior to Admission medications   Medication Sig Start Date End Date Taking? Authorizing Provider  cyclobenzaprine (FLEXERIL) 5 MG tablet  04/02/16   [provider]    Family History Family History  Problem Relation Age of Onset  . Hyperlipidemia Other   . Hypertension Other   . Diabetes Other   . Obesity Other   . Diabetes Sister     Social History Social History  Substance Use Topics  . Smoking status: Never Smoker  . Smokeless tobacco: Never Used  . Alcohol use Yes     Allergies   Patient has no known allergies.   Review of Systems Review of Systems  Constitutional: Negative for chills, diaphoresis and fever.  Eyes: Negative for visual disturbance.  Respiratory: Positive for chest tightness and shortness of breath.   Cardiovascular: Positive for chest pain.  Gastrointestinal: Negative for abdominal pain, nausea and vomiting.  Musculoskeletal: Positive for back pain. Negative for neck pain.  Neurological: Positive for weakness and numbness.  All other systems reviewed and are negative.  Physical Exam Updated Vital Signs BP (!) 153/111 (BP Location: Right Arm)   Pulse 84   Temp 98.3 F (36.8 C) (Oral)   Resp 16   SpO2 99%   Physical Exam  CONSTITUTIONAL: Well developed/well nourished HEAD: Normocephalic/atraumatic EYES: EOMI/PERRL ENMT:  Mucous membranes moist NECK: supple no meningeal signs, no bruits SPINE/BACK:entire spine nontender CV: S1/S2 noted, no murmurs/rubs/gallops noted LUNGS: Lungs are clear to auscultation bilaterally, no apparent distress Chest - mild tenderness to anterior chest ABDOMEN: soft, nontender, no rebound or guarding, bowel sounds noted throughout abdomen GU:no cva tenderness NEURO: Pt is awake/alert/appropriate, moves all extremitiesx4.  No facial droop. Mild left facial  sensory deficit reported. Mild sensory deficit to left upper extremity, decreased grip noted to left hand. Mild left lower extremity drift.  EXTREMITIES: pulses normal/equal x4, full ROM SKIN: warm, color normal PSYCH: no abnormalities of mood noted, alert and oriented to situation  ED Treatments / Results  DIAGNOSTIC STUDIES: Oxygen Saturation is 99% on RA, normal by my interpretation.   COORDINATION OF CARE: 12:06 AM-Discussed next steps with pt. Pt verbalized understanding and is agreeable with the plan.   Labs (all labs ordered are listed, but only abnormal results are displayed) Labs Reviewed  BASIC METABOLIC PANEL  CBC  ETHANOL  RAPID URINE DRUG SCREEN, HOSP PERFORMED  URINALYSIS, ROUTINE W REFLEX MICROSCOPIC  I-STAT TROPOININ, ED  I-STAT TROPOININ, ED    EKG  EKG Interpretation  Date/Time:  Monday Oct 19 2016 18:47:03 EDT Ventricular Rate:  88 PR Interval:  174 QRS Duration: 88 QT Interval:  364 QTC Calculation: 440 R Axis:   29 Text Interpretation:  Normal sinus rhythm Cannot rule out Anterior infarct , age undetermined Abnormal ECG No previous ECGs available Confirmed by Zadie Rhine ([id number]) on 10/19/2016 11:44:39 PM       Radiology Dg Chest 2 View  Result Date: 10/19/2016 CLINICAL DATA:  Left sided chest pain and left arm numbness X a couple of hours per patient. HX EXAM: CHEST  2 VIEW COMPARISON:  None. FINDINGS: Shallow lung inflation. Heart size is normal. The lungs are free of focal consolidations and pleural effusions. No pulmonary edema. IMPRESSION: No active cardiopulmonary disease. Electronically Signed   By: Norva Pavlov M.D.   On: 10/19/2016 20:25   Ct Head Wo Contrast  Result Date: 10/19/2016 CLINICAL DATA:  Right-sided arm pain and headache EXAM: CT HEAD WITHOUT CONTRAST TECHNIQUE: Contiguous axial images were obtained from the base of the skull through the vertex without intravenous contrast. COMPARISON:  05/02/2005 head CT FINDINGS:  Brain: No evidence of acute infarction, hemorrhage, hydrocephalus, extra-axial collection or mass lesion/mass effect. Tiny cavum septi pellucidi. Vascular: No hyperdense vessel or unexpected calcification. Skull: Normal. Negative for fracture or focal lesion. Sinuses/Orbits: No acute finding. Other: None. IMPRESSION: No acute intracranial abnormality. Electronically Signed   By: Tollie Eth M.D.   On: 10/19/2016 21:29   Mr Brain Wo Contrast  Result Date: 10/20/2016 CLINICAL DATA:  Left arm numbness and weakness EXAM: MRI HEAD WITHOUT CONTRAST TECHNIQUE: Multiplanar, multiecho pulse sequences of the brain and surrounding structures were obtained without intravenous contrast. COMPARISON:  CT 10/19/2016 FINDINGS: Brain: The midline structures are normal. There is no focal diffusion restriction to indicate acute infarct. No intraparenchymal hemorrhage. Mild periventricular hyperintense T2 weighted signal, not unexpected for age. No intraparenchymal hematoma or chronic microhemorrhage. Brain volume is normal for age without age- advanced or lobar predominant atrophy. The dura is normal and there is no extra-axial collection. Vascular: Major intracranial arterial and venous sinus flow voids are preserved. Skull and upper cervical spine: The visualized skull base, calvarium, upper cervical spine and extracranial soft tissues are normal. Sinuses/Orbits: No fluid levels or advanced mucosal thickening. No mastoid effusion. Normal orbits. IMPRESSION: Normal MRI of the brain for  age. Electronically Signed   By: Deatra Robinson M.D.   On: 10/20/2016 01:19    Procedures Procedures (including critical care time)  Medications Ordered in ED Medications - No data to display   Initial Impression / Assessment and Plan / ED Course  I have reviewed the triage vital signs and the nursing notes.  Pertinent labs & imaging results that were available during my care of the patient were reviewed by me and considered in my  medical decision making (see chart for details).     12:32 AM tPA in stroke considered but not given due to: Onset over 3-4.5hours For CP = doubt PE, low risk for ACS (HEART score 3), CP is reproducible.  Denies tearing/ripping pain, equal pulsesx4, so I doubt dissection given history/exam However I am concerned for unilateral numbness and ?weakness on left side I feel MRI brain warranted Pt agreeable 3:27 AM MRI negative Pt reports improvement I spoke to Dr Roseanne Reno with neurology He reports pt should be admitted for TIA workup given earlier symptoms and exam findings D/w Dr Julian Reil for admission Pt agreeable   For CP - pt is currently [facility] negative  Final Clinical Impressions(s) / ED Diagnoses   Final diagnoses:  Other specified transient cerebral ischemias    New Prescriptions New Prescriptions   No medications on file   I personally performed the services described in this documentation, which was scribed in my presence. The recorded information has been reviewed and is accurate.        Zadie Rhine, MD 10/20/16 [phone]

## 2016-10-20 ENCOUNTER — Observation Stay (HOSPITAL_BASED_OUTPATIENT_CLINIC_OR_DEPARTMENT_OTHER): Payer: BLUE CROSS/BLUE SHIELD

## 2016-10-20 ENCOUNTER — Observation Stay (HOSPITAL_COMMUNITY): Payer: BLUE CROSS/BLUE SHIELD

## 2016-10-20 ENCOUNTER — Emergency Department (HOSPITAL_COMMUNITY): Payer: BLUE CROSS/BLUE SHIELD

## 2016-10-20 DIAGNOSIS — G243 Spasmodic torticollis: Secondary | ICD-10-CM

## 2016-10-20 DIAGNOSIS — G8194 Hemiplegia, unspecified affecting left nondominant side: Secondary | ICD-10-CM

## 2016-10-20 DIAGNOSIS — G458 Other transient cerebral ischemic attacks and related syndromes: Secondary | ICD-10-CM

## 2016-10-20 DIAGNOSIS — R531 Weakness: Secondary | ICD-10-CM | POA: Diagnosis not present

## 2016-10-20 DIAGNOSIS — R200 Anesthesia of skin: Secondary | ICD-10-CM | POA: Diagnosis not present

## 2016-10-20 DIAGNOSIS — G459 Transient cerebral ischemic attack, unspecified: Secondary | ICD-10-CM

## 2016-10-20 DIAGNOSIS — R220 Localized swelling, mass and lump, head: Secondary | ICD-10-CM | POA: Diagnosis not present

## 2016-10-20 LAB — RAPID URINE DRUG SCREEN, HOSP PERFORMED
Amphetamines: NOT DETECTED
BENZODIAZEPINES: NOT DETECTED
Barbiturates: NOT DETECTED
COCAINE: NOT DETECTED
Opiates: NOT DETECTED
Tetrahydrocannabinol: NOT DETECTED

## 2016-10-20 LAB — LIPID PANEL
CHOLESTEROL: 149 mg/dL (ref 0–200)
HDL: 25 mg/dL — AB (ref >40)
LDL Cholesterol: 97 mg/dL (ref 0–99)
TRIGLYCERIDES: 135 mg/dL (ref <150)
Total CHOL/HDL Ratio: 6 RATIO
VLDL: 27 mg/dL (ref 0–40)

## 2016-10-20 LAB — HIV ANTIBODY (ROUTINE TESTING W REFLEX): HIV SCREEN 4TH GENERATION: NONREACTIVE

## 2016-10-20 LAB — TSH: TSH: 3.664 u[IU]/mL (ref 0.350–4.500)

## 2016-10-20 LAB — ECHOCARDIOGRAM COMPLETE
E decel time: 264 msec
EERAT: 8.74
FS: 24 % — AB (ref 28–44)
Height: 66 in
IVS/LV PW RATIO, ED: 1.06
LA ID, A-P, ES: 36 mm
LA diam end sys: 36 mm
LA diam index: 1.8 cm/m2
LA vol index: 26 mL/m2
LA vol: 52 mL
LAVOLA4C: 47.3 mL
LV TDI E'LATERAL: 8.38
LV e' LATERAL: 8.38 cm/s
LVEEAVG: 8.74
LVEEMED: 8.74
LVOT SV: 92 mL
LVOT VTI: 20.4 cm
LVOT area: 4.52 cm2
LVOT diameter: 24 mm
LVOT peak vel: 108 cm/s
MV Dec: 264
MVPG: 2 mmHg
MVPKAVEL: 63.1 m/s
MVPKEVEL: 73.2 m/s
PW: 10.8 mm — AB (ref 0.6–1.1)
RV LATERAL S' VELOCITY: 13.7 cm/s
Reg peak vel: 199 cm/s
TAPSE: 25.4 mm
TDI e' medial: 5.33
TR max vel: 199 cm/s
Weight: 2962.98 oz

## 2016-10-20 LAB — URINALYSIS, ROUTINE W REFLEX MICROSCOPIC
Bilirubin Urine: NEGATIVE
Glucose, UA: NEGATIVE mg/dL
HGB URINE DIPSTICK: NEGATIVE
Ketones, ur: NEGATIVE mg/dL
LEUKOCYTES UA: NEGATIVE
Nitrite: NEGATIVE
Protein, ur: NEGATIVE mg/dL
Specific Gravity, Urine: 1.014 (ref 1.005–1.030)
pH: 7 (ref 5.0–8.0)

## 2016-10-20 LAB — HEPATIC FUNCTION PANEL
ALK PHOS: 56 U/L (ref 38–126)
ALT: 27 U/L (ref 17–63)
AST: 28 U/L (ref 15–41)
Albumin: 4 g/dL (ref 3.5–5.0)
BILIRUBIN DIRECT: 0.2 mg/dL (ref 0.1–0.5)
BILIRUBIN INDIRECT: 1.3 mg/dL — AB (ref 0.3–0.9)
BILIRUBIN TOTAL: 1.5 mg/dL — AB (ref 0.3–1.2)
Total Protein: 6.7 g/dL (ref 6.5–8.1)

## 2016-10-20 LAB — ETHANOL: Alcohol, Ethyl (B): <5 mg/dL (ref <5)

## 2016-10-20 LAB — I-STAT TROPONIN, ED: Troponin i, poc: 0 ng/mL (ref 0.00–0.08)

## 2016-10-20 LAB — VITAMIN B12: Vitamin B-12: 568 pg/mL (ref 180–914)

## 2016-10-20 LAB — D-DIMER, QUANTITATIVE: D-Dimer, Quant: 0.32 ug/mL-FEU (ref 0.00–0.50)

## 2016-10-20 MED ORDER — STROKE: EARLY STAGES OF RECOVERY BOOK
Freq: Once | Status: AC
  Administered 2016-10-20: 06:00:00
  Filled 2016-10-20: qty 1

## 2016-10-20 MED ORDER — ENOXAPARIN SODIUM 40 MG/0.4ML ~~LOC~~ SOLN
40.0000 mg | SUBCUTANEOUS | Status: DC
  Administered 2016-10-20: 40 mg via SUBCUTANEOUS
  Filled 2016-10-20 (×2): qty 0.4

## 2016-10-20 MED ORDER — TRIAMTERENE-HCTZ 37.5-25 MG PO CAPS: 1.0000 | ORAL_CAPSULE | Freq: Every day | ORAL | 11 refills | Status: DC

## 2016-10-20 MED ORDER — ACETAMINOPHEN 650 MG RE SUPP: 650.0000 mg | RECTAL | Status: DC | PRN

## 2016-10-20 MED ORDER — ASPIRIN 300 MG RE SUPP: 300.0000 mg | Freq: Every day | RECTAL | Status: DC

## 2016-10-20 MED ORDER — ACETAMINOPHEN 325 MG PO TABS
650.0000 mg | ORAL_TABLET | ORAL | Status: DC | PRN
  Administered 2016-10-20 (×2): 650 mg via ORAL
  Filled 2016-10-20 (×2): qty 2

## 2016-10-20 MED ORDER — ACETAMINOPHEN 160 MG/5ML PO SOLN: 650.0000 mg | ORAL | Status: DC | PRN

## 2016-10-20 MED ORDER — ASPIRIN EC 81 MG PO TBEC: 81.0000 mg | DELAYED_RELEASE_TABLET | Freq: Every day | ORAL | 2 refills | Status: DC

## 2016-10-20 MED ORDER — ASPIRIN 325 MG PO TABS: 325.0000 mg | ORAL_TABLET | Freq: Every day | ORAL | Status: DC

## 2016-10-20 NOTE — Consult Note (Signed)
Neurology Consult Note  Reason for Consultation: TIA  Requesting provider: Lyda PeroneJared Gardner, DO  CC: Weakness and numbness of the left arm and leg  HPI: This is a 41 year old right-handed man who presented to the emergency department after he experienced the onset of tingling and weakness in his left arm and leg. He states that he is in his usual state of health until about 4:30 or 5:00 on the afternoon of 10/19/16. He started noticing tingling in his right arm and leg. This was followed by weakness in those extremities. He says symptoms have persisted since their onset does seem to be getting better. Currently, he is no longer having any numbness or tingling but still feels weakness. He denies any difficulty swallowing or talking. He did not have any vision loss or double vision. There were no facial symptoms. He has no right-sided weakness or numbness. He denies any difficulty with balance or coordination. Symptoms are accompanied by left sided chest pain and shortness of breath. He presented to the emergency department and was admitted for workup of presumed TIA. Neurology is now consulted as part of this evaluation.  Of note, the patient has a history of cervical dystonia. He states that this is currently being evaluated and managed by Dr. Levert FeinsteinYijun Yan. He last saw her on 05/20/16. She was planning to initiate treatment with EMG guided Botox injections. However, according to notes in his chart, he has not received any therapy yet because he has not followed up on appropriate paperwork to clear the medication. He states that his symptoms on the left arm and leg are completely different from anything is ever experienced with his cervical dystonia.  PMH:  1. Cervical dystonia 2. Hyperlipidemia   PSH:  Past Surgical History:  Procedure Laterality Date  . CYSTECTOMY Left 10/19/2014   left branchial cleft cyst removed  . FRACTURE SURGERY     fx fingers on left hand playing football    Family  history: Family History  Problem Relation Age of Onset  . Hyperlipidemia Other   . Hypertension Other   . Diabetes Other   . Obesity Other   . Diabetes Sister     Social history:  Social History   Social History  . Marital status: Married    Spouse name: Lowella Bandyikki  . Number of children: 2  . Years of education: 12+   Occupational History  .      [employer], designs websites   Social History Main Topics  . Smoking status: Never Smoker  . Smokeless tobacco: Never Used  . Alcohol use Yes  . Drug use: No  . Sexual activity: Not on file   Other Topics Concern  . Not on file   Social History Narrative   Lives at home w/ his wife and children   Right-handed   Caffeine: 3 cups of coffee/tea   Current inpatient meds: Medications reviewed and reconciled.  Current Facility-Administered Medications  Medication Dose Route Frequency Provider Last Rate Last Dose  . acetaminophen (TYLENOL) tablet 650 mg  650 mg Oral Q4H PRN Hillary BowGardner, Jared M, DO   650 mg at 10/20/16 1332   Or  . acetaminophen (TYLENOL) solution 650 mg  650 mg Per Tube Q4H PRN Hillary BowGardner, Jared M, DO       Or  . acetaminophen (TYLENOL) suppository 650 mg  650 mg Rectal Q4H PRN Hillary BowGardner, Jared M, DO      . enoxaparin (LOVENOX) injection 40 mg  40 mg Subcutaneous Q24H Hillary BowGardner, Jared M,  DO   40 mg at 10/20/16 1020    Allergies: No Known Allergies  ROS: As per HPI. A full 14-point review of systems was performed and is otherwise unremarkable.  PE:  BP (!) 141/99   Pulse 87   Temp 97.8 F (36.6 C) (Oral)   Resp 18   Ht 5\' 6"  (1.676 m)   Wt 84 kg (185 lb 3 oz)   SpO2 96%   BMI 29.89 kg/m   General: WDWN, no acute distress. AAO x4. Speech clear, no dysarthria. No aphasia. Follows commands briskly. Affect is bright with congruent mood. Comportment is normal.  HEENT: Normocephalic. Neck supple without LAD. MMM, OP clear. Dentition good. Sclerae anicteric. No conjunctival injection.  CV: Regular, no murmur. Carotid  pulses full and symmetric, no bruits. Distal pulses 2+ and symmetric.  Lungs: CTAB.  Abdomen: Soft, non-distended, non-tender. Bowel sounds present x4.  Extremities: No C/C/E. Neuro:  CN: Pupils are equal and round. They are symmetrically reactive from 3-->2 mm. EOMI without nystagmus. No reported diplopia. Facial sensation is intact to light touch. Face is symmetric at rest with normal strength and mobility. Hearing is intact to conversational voice. Palate elevates symmetrically and uvula is midline. Voice is normal in tone, pitch and quality. Bilateral SCM and trapezii are 5/5. Tongue is midline with normal bulk and mobility.  Motor: Normal bulk, tone, and strength with the exception of some moderate giveaway weakness on the left arm and leg. No tremor or other abnormal movements. No drift.  Sensation: Intact to light touch.  DTRs: 2+, symmetric. Toes downgoing bilaterally. No pathologic reflexes.  Coordination: Finger-to-nose and heel-to-shin are without dysmetria and were performed without any evidence of weakness.     Labs:  Lab Results  Component Value Date   WBC 6.0 10/19/2016   HGB 14.6 10/19/2016   HCT 43.1 10/19/2016   PLT 259 10/19/2016   GLUCOSE 95 10/19/2016   CHOL 149 10/20/2016   TRIG 135 10/20/2016   HDL 25 (L) 10/20/2016   LDLCALC 97 10/20/2016   ALT 27 10/20/2016   AST 28 10/20/2016   NA 139 10/19/2016   K 3.9 10/19/2016   CL 104 10/19/2016   CREATININE 1.10 10/19/2016   BUN 8 10/19/2016   CO2 28 10/19/2016   TSH 3.664 10/20/2016   Serum ethanol less than 5 HIV nonreactive Troponin 0.00 Vitamin B12 568 D-dimer 0.32 Urine drug screen negative Urinalysis negative  Imaging:  I have personally and independently reviewed the MRI scan of the brain without contrast from today. This shows no evidence of restricted diffusion to suggest acute ischemia. There is a very mild degree of scattered T2/flair hyperintense lesions in the bihemispheric white matter, with  particular involvement of the right frontal white matter. These are most suggestive of chronic small vessel ischemic changes though must also consider migraine or possibly remote traumatic injury. Volumes are normal for age. Ventricles are normal in size and configuration. The visualized intravascular flow voids are normal.  I have personally and independently reviewed the MRA of the head without contrast from today. This is unremarkable.  Other diagnostic studies:  TTE showed normal left ventricle size and thickness with ejection fraction 60-65 percent. No regional wall motion abnormalities are seen. He has grade 1 diastolic dysfunction. Mild thickening of the mitral valve leaflets was seen with trivial regurgitation. Moderate dilation of the right atrium is present.   Assessment and Plan:  1. Left hemiparesis: This was acute. Current examination suggests nonphysiologic weakness. MRI scan  did not show any evidence of stroke and he does not have any hyperreflexia or tone changes to suggest a central process. At this point, I would recommend PT and OT as needed with supportive care. I would anticipate ongoing improvement in his symptoms. No further testing is recommended at this time.  2. Left-sided numbness: This was acute but has since resolved. No evidence of stroke on imaging. Follow.  3. Cervical dystonia: No acute issues. He is not having any dystonia at the time of my assessment. Continue outpatient follow-up.  Needs outpatient neurology follow-up?: Yes, recommended outpatient follow-up with Dr. Terrace Arabia  This was discussed with the patient. Education was provided on the diagnosis and expected evaluation and treatment. He is in agreement with the plan as noted. He was given the opportunity to ask any questions and these were addressed to his satisfaction.   I also discussed my impression and recommendations with the attending, Dr. Susie Cassette. I have no additional recommendations at this time and we'll  sign off.

## 2016-10-20 NOTE — H&P (Signed)
History and Physical    Jesse Ho:[id number] DOB: 12-16-1975 DOA: 10/19/2016  PCP: Henrine Screws, MD  Patient coming from: Home  I have personally briefly reviewed patient's old medical records in [facility]  Chief Complaint: L facial numbness  HPI: Jesse Ho is a 41 y.o. male with medical history significant of HLD.  Patient presents to the ED with c/o intermittent L arm numbness and weakness.  Symptoms onset 5:30 PM yesterday.  Currently resolved.  Also had associated SOB, L leg paresthesias, mild left facial numbness, back and chest pain.  Initial episode lasted 45 min.  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  Symptoms today were different however.   ED Course: CT head neg, MRI brain neg.   Review of Systems: As per HPI otherwise 10 point review of systems negative.   Past Medical History:  Diagnosis Date  . Hyperlipidemia     Past Surgical History:  Procedure Laterality Date  . CYSTECTOMY Left 10/19/2014   left branchial cleft cyst removed  . FRACTURE SURGERY     fx fingers on left hand playing football     reports that he has never smoked. He has never used smokeless tobacco. He reports that he drinks alcohol. He reports that he does not use drugs.  No Known Allergies  Family History  Problem Relation Age of Onset  . Hyperlipidemia Other   . Hypertension Other   . Diabetes Other   . Obesity Other   . Diabetes Sister      Prior to Admission medications   Medication Sig Start Date End Date Taking? Authorizing Provider  cyclobenzaprine (FLEXERIL) 5 MG tablet  04/02/16   [provider]    Physical Exam: Vitals:   10/20/16 0215 10/20/16 0245 10/20/16 0330 10/20/16 0400  BP: (!) 133/92 (!) 135/95 (!) 139/94 124/89  Pulse: (!) 58 (!) 53 (!) 53 (!) 51  Resp: (!) 21 (!) 22 (!) 29 (!) 22  Temp:      TempSrc:      SpO2: 95% 94% 96% 96%     Constitutional: NAD, calm, comfortable Eyes: PERRL, lids and conjunctivae normal ENMT: Mucous membranes are moist. Posterior pharynx clear of any exudate or lesions.Normal dentition.  Neck: normal, supple, no masses, no thyromegaly Respiratory: clear to auscultation bilaterally, no wheezing, no crackles. Normal respiratory effort. No accessory muscle use.  Cardiovascular: Regular rate and rhythm, no murmurs / rubs / gallops. No extremity edema. 2+ pedal pulses. No carotid bruits.  Abdomen: no tenderness, no masses palpated. No hepatosplenomegaly. Bowel sounds positive.  Musculoskeletal: no clubbing / cyanosis. No joint deformity upper and lower extremities. Good ROM, no contractures. Normal muscle tone.  Skin: no rashes, lesions, ulcers. No induration Neurologic: CN 2-12 grossly intact. Sensation intact, DTR normal. Strength 5/5 in all 4.  Psychiatric: Normal judgment and insight. Alert and oriented x 3. Normal mood.    Labs on Admission: I have personally reviewed following labs and imaging studies  CBC:  Recent Labs Lab 10/19/16 2045  WBC 6.0  HGB 14.6  HCT 43.1  MCV 90.4  PLT 259   Basic Metabolic Panel:  Recent Labs Lab 10/19/16 2045  NA 139  K 3.9  CL 104  CO2 28  GLUCOSE 95  BUN 8  CREATININE 1.10  CALCIUM 9.4   GFR: CrCl cannot be calculated (Unknown ideal weight.). Liver Function Tests: No results for input(s): AST,  ALT, ALKPHOS, BILITOT, PROT, ALBUMIN in the last 168 hours. No results for input(s): LIPASE, AMYLASE in the last 168 hours. No results for input(s): AMMONIA in the last 168 hours. Coagulation Profile: No results for input(s): INR, PROTIME in the last 168 hours. Cardiac Enzymes: No results for input(s): CKTOTAL, CKMB, CKMBINDEX, TROPONINI in the last 168 hours. BNP (last 3 results) No results for input(s): PROBNP in the last 8760 hours. HbA1C: No results for input(s): HGBA1C in the last 72 hours. CBG: No results for input(s): GLUCAP in  the last 168 hours. Lipid Profile: No results for input(s): CHOL, HDL, LDLCALC, TRIG, CHOLHDL, LDLDIRECT in the last 72 hours. Thyroid Function Tests: No results for input(s): TSH, T4TOTAL, FREET4, T3FREE, THYROIDAB in the last 72 hours. Anemia Panel: No results for input(s): VITAMINB12, FOLATE, FERRITIN, TIBC, IRON, RETICCTPCT in the last 72 hours. Urine analysis: No results found for: COLORURINE, APPEARANCEUR, LABSPEC, PHURINE, GLUCOSEU, HGBUR, BILIRUBINUR, KETONESUR, PROTEINUR, UROBILINOGEN, NITRITE, LEUKOCYTESUR  Radiological Exams on Admission: Dg Chest 2 View  Result Date: 10/19/2016 CLINICAL DATA:  Left sided chest pain and left arm numbness X a couple of hours per patient. HX EXAM: CHEST  2 VIEW COMPARISON:  None. FINDINGS: Shallow lung inflation. Heart size is normal. The lungs are free of focal consolidations and pleural effusions. No pulmonary edema. IMPRESSION: No active cardiopulmonary disease. Electronically Signed   By: Norva Pavlov M.D.   On: 10/19/2016 20:25   Ct Head Wo Contrast  Result Date: 10/19/2016 CLINICAL DATA:  Right-sided arm pain and headache EXAM: CT HEAD WITHOUT CONTRAST TECHNIQUE: Contiguous axial images were obtained from the base of the skull through the vertex without intravenous contrast. COMPARISON:  05/02/2005 head CT FINDINGS: Brain: No evidence of acute infarction, hemorrhage, hydrocephalus, extra-axial collection or mass lesion/mass effect. Tiny cavum septi pellucidi. Vascular: No hyperdense vessel or unexpected calcification. Skull: Normal. Negative for fracture or focal lesion. Sinuses/Orbits: No acute finding. Other: None. IMPRESSION: No acute intracranial abnormality. Electronically Signed   By: Tollie Eth M.D.   On: 10/19/2016 21:29   Mr Brain Wo Contrast  Result Date: 10/20/2016 CLINICAL DATA:  Left arm numbness and weakness EXAM: MRI HEAD WITHOUT CONTRAST TECHNIQUE: Multiplanar, multiecho pulse sequences of the brain and surrounding structures  were obtained without intravenous contrast. COMPARISON:  CT 10/19/2016 FINDINGS: Brain: The midline structures are normal. There is no focal diffusion restriction to indicate acute infarct. No intraparenchymal hemorrhage. Mild periventricular hyperintense T2 weighted signal, not unexpected for age. No intraparenchymal hematoma or chronic microhemorrhage. Brain volume is normal for age without age- advanced or lobar predominant atrophy. The dura is normal and there is no extra-axial collection. Vascular: Major intracranial arterial and venous sinus flow voids are preserved. Skull and upper cervical spine: The visualized skull base, calvarium, upper cervical spine and extracranial soft tissues are normal. Sinuses/Orbits: No fluid levels or advanced mucosal thickening. No mastoid effusion. Normal orbits. IMPRESSION: Normal MRI of the brain for age. Electronically Signed   By: Deatra Robinson M.D.   On: 10/20/2016 01:19    EKG: Independently reviewed.  Assessment/Plan Active Problems:   Left facial numbness    1. L facial numbness - will admit for TIA work up: 1. TIA pathway 2. Will hold of on ordering daily ASA for now given lower suspicion of TIA with negative MRI head, h/o similar sensory symptoms in L neck previously due to h/o cyst removal. 3. Tele monitor 4. A1C, lipid pnl pending 5. MRA, 2d echo, carotid dopplers  DVT prophylaxis: Lovenox  Code Status: Full Family Communication: No family in room Disposition Plan: Home after admit Consults called: None Admission status: Place in obs   Hillary BowGARDNER, JARED M. DO [facility] Pager [phone]  If 7AM-7PM, please contact day team taking care of patient [email or web address] Password [email or web address]  10/20/2016, 4:28 AM

## 2016-10-20 NOTE — Progress Notes (Signed)
  Echocardiogram 2D Echocardiogram has been performed.  Arvil ChacoFoster, Avah Bashor 10/20/2016, 1:30 PM

## 2016-10-20 NOTE — ED Notes (Signed)
Patient transported to MRI 

## 2016-10-20 NOTE — Evaluation (Signed)
Occupational Therapy Evaluation Patient Details Name: HAEDYN ANCRUM MRN: [id number] DOB: [birth year] Today's Date: 10/20/2016    History of Present Illness 41 y.o. male admitted with L sided weakness/numbness. Imaging negative for infarct. PMH L brachial cleft cyst removal May 2016.    Clinical Impression   PTA, pt was independent with ADL and functional mobility. He currently requires overall supervision during ADL and requires increased time with fasteners during dressing tasks. Pt presents with decreased L UE strength and coordination impacting his ability to participate in ADL and work related tasks at [employer]. He works in Clinical biochemist and requires in-tact L UE fine and gross motor coordination to be successful with daily work tasks. He would benefit from continued OT services while admitted to improve independence with ADL and functional mobility. Recommend neuro outpatient OT services post-acute D/C in order to maximize functional use of L UE for daily work and self-care tasks. OT will continue to follow while admitted.    Follow Up Recommendations  Outpatient OT;Supervision/Assistance - 24 hour ([facility] outpatient OT)    Equipment Recommendations  None recommended by OT    Recommendations for Other Services       Precautions / Restrictions Precautions Precautions: None Precaution Comments: denies h/o falls Restrictions Weight Bearing Restrictions: No      Mobility Bed Mobility Overal bed mobility: Independent                Transfers Overall transfer level: Needs assistance Equipment used: None Transfers: Sit to/from Stand Sit to Stand: Supervision         General transfer comment: No physical assistance required.    Balance Overall balance assessment: No apparent balance deficits (not formally assessed)                                         ADL either performed or assessed with clinical judgement   ADL Overall ADL's : Needs  assistance/impaired                                       General ADL Comments: Pt able to complete all ADL at supervision level in hospital setting. Increased difficulty with work related tasks due to decreased L UE fine and gross motor coordination as well as decreased L UE strength. Pt works on computer throughout the day and needs in tact L hand coordination to complete this successfully.     Vision Patient Visual Report:  (Reports "difficulty" yesterday; but unable to describe) Additional Comments: Pt able to read and utilize central and peripheral vision this session. Need to continue to assess visual fields  more fully.     Perception     Praxis      Pertinent Vitals/Pain Pain Assessment: No/denies pain     Hand Dominance     Extremity/Trunk Assessment Upper Extremity Assessment Upper Extremity Assessment: LUE deficits/detail LUE Deficits / Details: Overshooting with finger to nose testing. 4/5 strength grossly with increased time to recruit strength. Decreased light touch and temperature sensation throughout L UE. Decreased fine motor coordination in L hand but was able to open toothpaste bottle and complete self-feeding tasks with increased time. LUE Sensation: decreased light touch;decreased proprioception LUE Coordination: decreased fine motor;decreased gross motor   Lower Extremity Assessment Lower Extremity Assessment: Defer to PT evaluation  Communication Communication Communication: No difficulties   Cognition Arousal/Alertness: Awake/alert Behavior During Therapy: WFL for tasks assessed/performed Overall Cognitive Status: Within Functional Limits for tasks assessed                                     General Comments       Exercises     Shoulder Instructions      Home Living Family/patient expects to be discharged to:: Private residence Living Arrangements: Spouse/significant other;Children Available Help at  Discharge: Family Type of Home: House Home Access: Level entry     Home Layout: Two level;Bed/bath upstairs Alternate Level Stairs-Number of Steps: flight   Bathroom Shower/Tub: Chief Strategy OfficerTub/shower unit   Bathroom Toilet: Standard     Home Equipment: None          Prior Functioning/Environment Level of Independence: Independent        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        OT Problem List: Decreased strength;Decreased range of motion;Decreased activity tolerance;Decreased safety awareness;Decreased knowledge of use of DME or AE;Decreased knowledge of precautions;Impaired UE functional use      OT Treatment/Interventions: Self-care/ADL training;Therapeutic exercise;Energy conservation;DME and/or AE instruction;Therapeutic activities;Visual/perceptual remediation/compensation;Patient/family education;Balance training    OT Goals(Current goals can be found in the care plan section) Acute Rehab OT Goals Patient Stated Goal: return to independence with mobility OT Goal Formulation: With patient Time For Goal Achievement: 11/03/16 Potential to Achieve Goals: Good ADL Goals Pt Will Perform Grooming: Independently;standing (including gathering items) Pt/caregiver will Perform Home Exercise Program: Increased strength;Left upper extremity;With written HEP provided;Independently Additional ADL Goal #1: Pt will independently complete L UE fine motor coordination HEP in preparation for improved functional use of L hand for computer programming tasks at work. Additional ADL Goal #2: Pt will demonstrate improved L hand fine motor coordination to complete a variety of fasteners independently.  OT Frequency: Min 2X/week   Barriers to D/C:            Co-evaluation              AM-PAC PT "6 Clicks" Daily Activity     Outcome Measure Help from another person eating meals?: A Little Help from another person taking care of personal grooming?: A Little Help from another person  toileting, which includes using toliet, bedpan, or urinal?: None Help from another person bathing (including washing, rinsing, drying)?: A Little Help from another person to put on and taking off regular upper body clothing?: A Little Help from another person to put on and taking off regular lower body clothing?: None 6 Click Score: 20   End of Session    Activity Tolerance: Patient tolerated treatment well Patient left: in bed;with call bell/phone within reach;with family/visitor present  OT Visit Diagnosis: Hemiplegia and hemiparesis Hemiplegia - Right/Left: Left Hemiplegia - dominant/non-dominant: Non-Dominant Hemiplegia - caused by: Unspecified                Time: [id number] OT Time Calculation (min): 9 min Charges:  OT General Charges $OT Visit: 1 Procedure OT Evaluation $OT Eval Moderate Complexity: 1 Procedure G-Codes: OT G-codes **NOT FOR INPATIENT CLASS** Functional Assessment Tool Used: Clinical judgement Functional Limitation: Self care Self Care Current Status ([id number]): At least 1 percent but less than 20 percent impaired, limited or restricted Self Care Goal Status ([id number](G8988): 0 percent impaired, limited or restricted   Doristine Sectionharity A Shown Dissinger, MS OTR/L  Pager: [phone]  Deara Bober A Omer Puccinelli 10/20/2016, 2:50 PM

## 2016-10-20 NOTE — Progress Notes (Signed)
SLP Cancellation Note  Patient Details Name: Tamera PuntDavid G Saulter MRN: [id number] DOB: 02/18/1976   Cancelled treatment:       Reason Eval/Treat Not Completed: SLP screened, no needs identified, will sign off   Evangelia Whitaker, Riley NearingBonnie Caroline 10/20/2016, 9:44 AM

## 2016-10-20 NOTE — Discharge Summary (Addendum)
Physician Discharge Summary  Jesse Ho MRN: [id number] DOB/AGE: 1975-12-22 41 y.o.  PCP: Aura Dials, MD   Admit date: 10/19/2016 Discharge date: 10/20/2016  Discharge Diagnoses:    Active Problems:   Left facial numbness   TIA (transient ischemic attack)    Follow-up recommendations Follow-up with PCP in 3-5 days , including all  additional recommended appointments as below Follow-up CBC, CMP in 3-5 days       Current Discharge Medication List    START taking these medications   Details  aspirin EC 81 MG tablet Take 1 tablet (81 mg total) by mouth daily. Qty: 30 tablet, Refills: 2    triamterene-hydrochlorothiazide (DYAZIDE) 37.5-25 MG capsule Take 1 each (1 capsule total) by mouth daily. Qty: 30 capsule, Refills: 11      CONTINUE these medications which have NOT CHANGED   Details  cyclobenzaprine (FLEXERIL) 5 MG tablet          Discharge Condition: STABLE  Discharge Instructions Get Medicines reviewed and adjusted: Please take all your medications with you for your next visit with your Primary MD  Please request your Primary MD to go over all hospital tests and procedure/radiological results at the follow up, please ask your Primary MD to get all Hospital records sent to his/her office.  If you experience worsening of your admission symptoms, develop shortness of breath, life threatening emergency, suicidal or homicidal thoughts you must seek medical attention immediately by calling 911 or calling your MD immediately if symptoms less severe.  You must read complete instructions/literature along with all the possible adverse reactions/side effects for all the Medicines you take and that have been prescribed to you. Take any new Medicines after you have completely understood and accpet all the possible adverse reactions/side effects.   Do not drive when taking Pain medications.   Do not take more than prescribed Pain, Sleep and Anxiety  Medications  Special Instructions: If you have smoked or chewed Tobacco in the last 2 yrs please stop smoking, stop any regular Alcohol and or any Recreational drug use.  Wear Seat belts while driving.  Please note  You were cared for by a hospitalist during your hospital stay. Once you are discharged, your primary care physician will handle any further medical issues. Please note that NO REFILLS for any discharge medications will be authorized once you are discharged, as it is imperative that you return to your primary care physician (or establish a relationship with a primary care physician if you do not have one) for your aftercare needs so that they can reassess your need for medications and monitor your lab values.  Discharge Instructions    Ambulatory referral to Occupational Therapy    Complete by:  As directed    Ambulatory referral to Physical Therapy    Complete by:  As directed    Diet - low sodium heart healthy    Complete by:  As directed    Increase activity slowly    Complete by:  As directed        No Known Allergies    Disposition: 01-Home or Self Care   Consults:  neurology    Significant Diagnostic Studies:  Dg Chest 2 View  Result Date: 10/19/2016 CLINICAL DATA:  Left sided chest pain and left arm numbness X a couple of hours per patient. HX EXAM: CHEST  2 VIEW COMPARISON:  None. FINDINGS: Shallow lung inflation. Heart size is normal. The lungs are free of focal consolidations and pleural effusions.  No pulmonary edema. IMPRESSION: No active cardiopulmonary disease. Electronically Signed   By: Nolon Nations M.D.   On: 10/19/2016 20:25   Ct Head Wo Contrast  Result Date: 10/19/2016 CLINICAL DATA:  Right-sided arm pain and headache EXAM: CT HEAD WITHOUT CONTRAST TECHNIQUE: Contiguous axial images were obtained from the base of the skull through the vertex without intravenous contrast. COMPARISON:  05/02/2005 head CT FINDINGS: Brain: No evidence of acute  infarction, hemorrhage, hydrocephalus, extra-axial collection or mass lesion/mass effect. Tiny cavum septi pellucidi. Vascular: No hyperdense vessel or unexpected calcification. Skull: Normal. Negative for fracture or focal lesion. Sinuses/Orbits: No acute finding. Other: None. IMPRESSION: No acute intracranial abnormality. Electronically Signed   By: Ashley Royalty M.D.   On: 10/19/2016 21:29   Mr Brain Wo Contrast  Result Date: 10/20/2016 CLINICAL DATA:  Left arm numbness and weakness EXAM: MRI HEAD WITHOUT CONTRAST TECHNIQUE: Multiplanar, multiecho pulse sequences of the brain and surrounding structures were obtained without intravenous contrast. COMPARISON:  CT 10/19/2016 FINDINGS: Brain: The midline structures are normal. There is no focal diffusion restriction to indicate acute infarct. No intraparenchymal hemorrhage. Mild periventricular hyperintense T2 weighted signal, not unexpected for age. No intraparenchymal hematoma or chronic microhemorrhage. Brain volume is normal for age without age- advanced or lobar predominant atrophy. The dura is normal and there is no extra-axial collection. Vascular: Major intracranial arterial and venous sinus flow voids are preserved. Skull and upper cervical spine: The visualized skull base, calvarium, upper cervical spine and extracranial soft tissues are normal. Sinuses/Orbits: No fluid levels or advanced mucosal thickening. No mastoid effusion. Normal orbits. IMPRESSION: Normal MRI of the brain for age. Electronically Signed   By: Ulyses Jarred M.D.   On: 10/20/2016 01:19   Mr Jodene Nam Head/brain ZJ Cm  Result Date: 10/20/2016 CLINICAL DATA:  Left facial numbness EXAM: MRA HEAD WITHOUT CONTRAST TECHNIQUE: Angiographic images of the Circle of Willis were obtained using MRA technique without intravenous contrast. COMPARISON:  Brain MRI 10/20/2016 FINDINGS: Intracranial internal carotid arteries: Normal. Anterior cerebral arteries: Normal. Middle cerebral arteries: Normal.  Posterior communicating arteries: Present on the left Posterior cerebral arteries: Normal right PCA. Fetal origin of the left PCA. Basilar artery: Normal. Vertebral arteries: Left dominant. Normal. Superior cerebellar arteries: Normal. Anterior inferior cerebellar arteries: Normal. Posterior inferior cerebellar arteries: Normal. IMPRESSION: No intracranial arterial occlusion or high-grade stenosis. Electronically Signed   By: Ulyses Jarred M.D.   On: 10/20/2016 05:34    echocardiogram  LV EF: 60% -   65%  ------------------------------------------------------------------- Indications:      435.9 TIA, suspected, pre-procedure.  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 Leg paresthesias. Mild left facial numbness. Back and chest pain.  Dyspnea.  Risk factors:  Dyslipidemia.  ------------------------------------------------------------------- Study Conclusions  - Left ventricle: The cavity size was normal. Wall thickness was   normal. Systolic function was normal. The estimated ejection   fraction was in the range of 60% to 65%. Wall motion was normal;   there were no regional wall motion abnormalities. Doppler   parameters are consistent with abnormal left ventricular   relaxation (grade 1 diastolic dysfunction). The E/e&' ratio is   between 8-15, suggesting indeterminate LV filling pressure. - Aorta: Mildly dilated aortic root. Aortic root dimension: 39 mm   (ED). - Mitral valve: Mildly thickened leaflets . There was trivial   regurgitation. - Left atrium: The atrium was normal in size. - Right atrium: Moderately dilated. - Tricuspid valve: There was  trivial regurgitation. - Pulmonary arteries: PA peak pressure: 19 mm Hg (S). - Systemic veins: The IVC is small (<1.2 cm) and collapses   spontaneously, suggesting volume depletion and a low RA pressure   of 3  mmHg.  Impressions:  - LVEF 60-65%, normal wall thickness, normal wall motion, grade 1   DD with indeterminate LV filling pressure, mildly dilated aortic   root 3.9 cm, normal LA size, moderate RAE, trivial TR, RVSP 19   mmHg, small IVC suggesting volume depletion.       Filed Weights   10/20/16 0530  Weight: 84 kg (185 lb 3 oz)     Microbiology: No results found for this or any previous visit (from the past 240 hour(s)).     Blood Culture No results found for: SDES, Webber, CULT, REPTSTATUS    Labs: Results for orders placed or performed during the hospital encounter of 10/19/16 (from the past 48 hour(s))  I-stat troponin, ED     Status: None   Collection Time: 10/19/16  7:33 PM  Result Value Ref Range   Troponin i, poc 0.00 0.00 - 0.08 ng/mL   Comment 3            Comment: Due to the release kinetics of cTnI, a negative result within the first hours of the onset of symptoms does not rule out myocardial infarction with certainty. If myocardial infarction is still suspected, repeat the test at appropriate intervals.   Basic metabolic panel     Status: None   Collection Time: 10/19/16  8:45 PM  Result Value Ref Range   Sodium 139 135 - 145 mmol/L   Potassium 3.9 3.5 - 5.1 mmol/L   Chloride 104 101 - 111 mmol/L   CO2 28 22 - 32 mmol/L   Glucose, Bld 95 65 - 99 mg/dL   BUN 8 6 - 20 mg/dL   Creatinine, Ser 1.10 0.61 - 1.24 mg/dL   Calcium 9.4 8.9 - 10.3 mg/dL   GFR calc non Af Amer >60 >60 mL/min   GFR calc Af Amer >60 >60 mL/min    Comment: (NOTE) The eGFR has been calculated using the CKD EPI equation. This calculation has not been validated in all clinical situations. eGFR's persistently <60 mL/min signify possible Chronic Kidney Disease.    Anion gap 7 5 - 15  CBC     Status: None   Collection Time: 10/19/16  8:45 PM  Result Value Ref Range   WBC 6.0 4.0 - 10.5 K/uL   RBC 4.77 4.22 - 5.81 MIL/uL   Hemoglobin 14.6 13.0 - 17.0 g/dL   HCT 43.1  39.0 - 52.0 %   MCV 90.4 78.0 - 100.0 fL   MCH 30.6 26.0 - 34.0 pg   MCHC 33.9 30.0 - 36.0 g/dL   RDW 13.6 11.5 - 15.5 %   Platelets 259 150 - 400 K/uL  Ethanol     Status: None   Collection Time: 10/20/16 12:18 AM  Result Value Ref Range   Alcohol, Ethyl (B) <5 <5 mg/dL    Comment:        LOWEST DETECTABLE LIMIT FOR SERUM ALCOHOL IS 5 mg/dL FOR MEDICAL PURPOSES ONLY   HIV antibody (Routine Testing)     Status: None   Collection Time: 10/20/16 12:18 AM  Result Value Ref Range   HIV Screen 4th Generation wRfx Non Reactive Non Reactive    Comment: (NOTE) Performed At: [facility] [address] [place], [place] [id number] Darrel Hoover  F MD ML:[id number]   I-stat troponin, ED     Status: None   Collection Time: 10/20/16 12:26 AM  Result Value Ref Range   Troponin i, poc 0.00 0.00 - 0.08 ng/mL   Comment 3            Comment: Due to the release kinetics of cTnI, a negative result within the first hours of the onset of symptoms does not rule out myocardial infarction with certainty. If myocardial infarction is still suspected, repeat the test at appropriate intervals.   Lipid panel     Status: Abnormal   Collection Time: 10/20/16  4:47 AM  Result Value Ref Range   Cholesterol 149 0 - 200 mg/dL   Triglycerides 135 <150 mg/dL   HDL 25 (L) >40 mg/dL   Total CHOL/HDL Ratio 6.0 RATIO   VLDL 27 0 - 40 mg/dL   LDL Cholesterol 97 0 - 99 mg/dL    Comment:        Total Cholesterol/HDL:CHD Risk Coronary Heart Disease Risk Table                     Men   Women  1/2 Average Risk   3.4   3.3  Average Risk       5.0   4.4  2 X Average Risk   9.6   7.1  3 X Average Risk  23.4   11.0        Use the calculated Patient Ratio above and the CHD Risk Table to determine the patient's CHD Risk.        ATP III CLASSIFICATION (LDL):  <100     mg/dL   Optimal  100-129  mg/dL   Near or Above                    Optimal  130-159  mg/dL   Borderline  160-189  mg/dL   High  >190      mg/dL   Very High   Hepatic function panel     Status: Abnormal   Collection Time: 10/20/16  4:47 AM  Result Value Ref Range   Total Protein 6.7 6.5 - 8.1 g/dL   Albumin 4.0 3.5 - 5.0 g/dL   AST 28 15 - 41 U/L   ALT 27 17 - 63 U/L   Alkaline Phosphatase 56 38 - 126 U/L   Total Bilirubin 1.5 (H) 0.3 - 1.2 mg/dL   Bilirubin, Direct 0.2 0.1 - 0.5 mg/dL   Indirect Bilirubin 1.3 (H) 0.3 - 0.9 mg/dL  Vitamin B12     Status: None   Collection Time: 10/20/16 11:23 AM  Result Value Ref Range   Vitamin B-12 568 180 - 914 pg/mL    Comment: (NOTE) This assay is not validated for testing neonatal or myeloproliferative syndrome specimens for Vitamin B12 levels.   D-dimer, quantitative (not at [facility])     Status: None   Collection Time: 10/20/16 11:23 AM  Result Value Ref Range   D-Dimer, Quant 0.32 0.00 - 0.50 ug/mL-FEU    Comment: (NOTE) At the manufacturer cut-off of 0.50 ug/mL FEU, this assay has been documented to exclude PE with a sensitivity and negative predictive value of 97 to 99%.  At this time, this assay has not been approved by the FDA to exclude DVT/VTE. Results should be correlated with clinical presentation.   TSH     Status: None   Collection Time: 10/20/16 11:23 AM  Result Value  Ref Range   TSH 3.664 0.350 - 4.500 uIU/mL    Comment: Performed by a 3rd Generation assay with a functional sensitivity of <=0.01 uIU/mL.  Urine rapid drug screen (hosp performed)not at [facility]     Status: None   Collection Time: 10/20/16  1:29 PM  Result Value Ref Range   Opiates NONE DETECTED NONE DETECTED   Cocaine NONE DETECTED NONE DETECTED   Benzodiazepines NONE DETECTED NONE DETECTED   Amphetamines NONE DETECTED NONE DETECTED   Tetrahydrocannabinol NONE DETECTED NONE DETECTED   Barbiturates NONE DETECTED NONE DETECTED    Comment:        DRUG SCREEN FOR MEDICAL PURPOSES ONLY.  IF CONFIRMATION IS NEEDED FOR ANY PURPOSE, NOTIFY LAB WITHIN 5 DAYS.        LOWEST DETECTABLE LIMITS FOR  URINE DRUG SCREEN Drug Class       Cutoff (ng/mL) Amphetamine      1000 Barbiturate      200 Benzodiazepine   106 Tricyclics       269 Opiates          300 Cocaine          300 THC              50   Urinalysis, Routine w reflex microscopic     Status: None   Collection Time: 10/20/16  1:29 PM  Result Value Ref Range   Color, Urine YELLOW YELLOW   APPearance CLEAR CLEAR   Specific Gravity, Urine 1.014 1.005 - 1.030   pH 7.0 5.0 - 8.0   Glucose, UA NEGATIVE NEGATIVE mg/dL   Hgb urine dipstick NEGATIVE NEGATIVE   Bilirubin Urine NEGATIVE NEGATIVE   Ketones, ur NEGATIVE NEGATIVE mg/dL   Protein, ur NEGATIVE NEGATIVE mg/dL   Nitrite NEGATIVE NEGATIVE   Leukocytes, UA NEGATIVE NEGATIVE     Lipid Panel     Component Value Date/Time   CHOL 149 10/20/2016 0447   TRIG 135 10/20/2016 0447   HDL 25 (L) 10/20/2016 0447   CHOLHDL 6.0 10/20/2016 0447   VLDL 27 10/20/2016 0447   LDLCALC 97 10/20/2016 0447     No results found for: HGBA1C   Lab Results  Component Value Date   LDLCALC 97 10/20/2016   CREATININE 1.10 10/19/2016     HPI   Jesse Ho is a 41 y.o. male with medical history significant of HLD.  Patient presents to the ED with c/o intermittent L arm numbness and weakness along with left sided chest pain with minimalexertion.  Symptoms onset 5:30 PM yesterday.  Currently resolved.  Also had associated SOB, L leg paresthesias, mild left facial numbness, back and chest pain.  Initial episode lasted 45 min.  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  HOSPITAL COURSE:  L facial numbness - MRI /MRA BRAIN negative,seen by neurology Dr Shon Hale, sx considered to be non physiologic , outpatient PT/OT recommended ,ECHO /EKG consistent with LVH , continue low dose ASA  Given echo changes secondary to longstanding HTN . Case discussed with Dr Shon Hale, no further recommendations, just follow up on routine with  outpatient neurology Dr Lucia Estelle, B-12 wnl  Chest pain D-dimer , troponin , all negative  HTN Started on dyazide    Discharge Exam:   Blood pressure (!) 142/90, pulse 83, temperature 98 F (36.7 C), resp. rate 18, height 5' 6" (1.676 m), weight 84 kg (185 lb 3 oz), SpO2 97 %.  Cardiovascular: Regular rate and rhythm, no murmurs / rubs / gallops. No extremity edema. 2+ pedal pulses. No carotid bruits.  Abdomen: no tenderness, no masses palpated. No hepatosplenomegaly. Bowel sounds positive.  Musculoskeletal: no clubbing / cyanosis. No joint deformity upper and lower extremities. Good ROM, no contractures. Normal muscle tone.  Skin: no rashes, lesions, ulcers. No induration Neurologic: CN 2-12 grossly intact. Sensation intact, DTR normal. Strength 5/5 in all 4.  Psychiatric: Normal judgment and insight. Alert and oriented x 3. Normal mood.     Follow-up Information    Aura Dials, MD. Call.   Specialty:  Family Medicine Why:  hospital follow up Contact information: [address] [place] [address] [phone]           Signed: Reyne Dumas 10/20/2016, 2:44 PM        Time spent >45 mins

## 2016-10-20 NOTE — Progress Notes (Signed)
Arrived from ED. Alert and oriented. C/O of left chest pain and upper back pain 6/10. Call light within reach.

## 2016-10-20 NOTE — Care Management Note (Signed)
Case Management Note  Patient Details  Name: Tamera PuntDavid G Gressman MRN: [id number] Date of Birth: 05/06/1976  Subjective/Objective:                    Action/Plan: Pt discharging home with self care. PT/OT recommending outpatient therapy. MD placed ambulatory orders for outpatient therapy. CM spoke to the patient and [facility] and they were able to pull order into their work que.  Patient has transportation home.   Expected Discharge Date:  10/20/16               Expected Discharge Plan:  Home/Self Care  In-House Referral:     Discharge planning Services  CM Consult  Post Acute Care Choice:    Choice offered to:     DME Arranged:    DME Agency:     HH Arranged:    HH Agency:     Status of Service:  Completed, signed off  If discussed at [employer] Length of Stay Meetings, dates discussed:    Additional Comments:  Kermit BaloKelli F Lorine Iannaccone, RN 10/20/2016, 3:16 PM

## 2016-10-20 NOTE — Progress Notes (Signed)
Pt. Discharged home; verbalized understanding of d/c instructions. Pt. Requests to drive home despite this writer's advice to have someone drive him. Pt. Ambulated independently to vehicle.

## 2016-10-20 NOTE — Progress Notes (Signed)
  Echocardiogram 2D Echocardiogram has been performed.  Arvil ChacoFoster, [place]a Tague 10/20/2016, 9:54 AM

## 2016-10-20 NOTE — Progress Notes (Signed)
Entered pt.'s room to find Echo being completed; per Dr. Susie CassetteAbrol pt. Is Not supposed to receive this test and that it can't be d/c'd. Notified tech at bedside to stop test.

## 2016-10-20 NOTE — Evaluation (Signed)
Physical Therapy Evaluation Patient Details Name: Jesse PuntDavid G Skow MRN: [id number] DOB: 01/21/1976 Today's Date: 10/20/2016   History of Present Illness  41 y.o. male admitted with L sided weakness/numbness. Imaging negative for infarct. PMH L brachial cleft cyst removal May 2016.   Clinical Impression  Pt admitted with above diagnosis. Pt currently with functional limitations due to the deficits listed below (see PT Problem List). Pt ambulated 180' without an assistive device, no loss of balance. Pt reports L knee weakness with ambulation, L hand and foot tingling present. Pt had L gastrocnemius muscle spasm with attempted sit to stand. Would benefit from outpatient PT if LLE weakness persists.  Pt will benefit from skilled PT to increase their independence and safety with mobility to allow discharge to the venue listed below.       Follow Up Recommendations Outpatient PT    Equipment Recommendations  None recommended by PT    Recommendations for Other Services       Precautions / Restrictions Precautions Precautions: None Precaution Comments: denies h/o falls Restrictions Weight Bearing Restrictions: No      Mobility  Bed Mobility Overal bed mobility: Independent                Transfers Overall transfer level: Independent                  Ambulation/Gait Ambulation/Gait assistance: Supervision Ambulation Distance (Feet): 180 Feet Assistive device: None Gait Pattern/deviations: Decreased weight shift to left;Step-through pattern   Gait velocity interpretation: at or above normal speed for age/gender General Gait Details: reports L knee feels "less stable", no observed buckling, no LOB  Stairs            Wheelchair Mobility    Modified Rankin (Stroke Patients Only)       Balance Overall balance assessment: Independent                                           Pertinent Vitals/Pain Pain Assessment: No/denies pain     Home Living Family/patient expects to be discharged to:: Private residence Living Arrangements: Spouse/significant other;Children Available Help at Discharge: Family Type of Home: House Home Access: Level entry     Home Layout: Two level;Bed/bath upstairs Home Equipment: None      Prior Function Level of Independence: Independent               Hand Dominance        Extremity/Trunk Assessment   Upper Extremity Assessment Upper Extremity Assessment: LUE deficits/detail;Defer to OT evaluation LUE Deficits / Details: pt reports tingling in L hand    Lower Extremity Assessment Lower Extremity Assessment: LLE deficits/detail LLE Deficits / Details: pt had severe L gastroc muscle spasm with attempted manual muscle testing of LLE so unable to fully assess strength, took ~5 min to resolve, reports L knee feels unstable with walking, decreased sensation to light touch medial foot LLE Sensation: decreased light touch    Cervical / Trunk Assessment Cervical / Trunk Assessment: Normal  Communication   Communication: No difficulties  Cognition Arousal/Alertness: Awake/alert Behavior During Therapy: WFL for tasks assessed/performed Overall Cognitive Status: Within Functional Limits for tasks assessed  General Comments      Exercises     Assessment/Plan    PT Assessment Patient needs continued PT services  PT Problem List Decreased strength;Decreased activity tolerance;Pain       PT Treatment Interventions Gait training;Therapeutic activities;Therapeutic exercise;Patient/family education    PT Goals (Current goals can be found in the Care Plan section)  Acute Rehab PT Goals Patient Stated Goal: return to independence with mobility PT Goal Formulation: With patient/family Time For Goal Achievement: 11/03/16 Potential to Achieve Goals: Good    Frequency Min 3X/week   Barriers to discharge         Co-evaluation               AM-PAC PT "6 Clicks" Daily Activity  Outcome Measure Difficulty turning over in bed (including adjusting bedclothes, sheets and blankets)?: None Difficulty moving from lying on back to sitting on the side of the bed? : None Difficulty sitting down on and standing up from a chair with arms (e.g., wheelchair, bedside commode, etc,.)?: None Help needed moving to and from a bed to chair (including a wheelchair)?: None Help needed walking in hospital room?: None Help needed climbing 3-5 steps with a railing? : A Little 6 Click Score: 23    End of Session Equipment Utilized During Treatment: Gait belt Activity Tolerance: Patient tolerated treatment well Patient left: in bed;with call bell/phone within reach;with family/visitor present;Other (comment) (with MD in room) Nurse Communication: Mobility status PT Visit Diagnosis: Muscle weakness (generalized) (M62.81);Other abnormalities of gait and mobility (R26.89)    Time: 1610-9604 PT Time Calculation (min) (ACUTE ONLY): 14 min   Charges:   PT Evaluation $PT Eval Low Complexity: 1 Procedure     PT G Codes:   PT G-Codes **NOT FOR INPATIENT CLASS** Functional Assessment Tool Used: AM-PAC 6 Clicks Basic Mobility Functional Limitation: Mobility: Walking and moving around Mobility: Walking and Moving Around Current Status (V4098): At least 1 percent but less than 20 percent impaired, limited or restricted Mobility: Walking and Moving Around Goal Status [phone]): 0 percent impaired, limited or restricted      Tamala Ser 10/20/2016, 10:48 AM [phone]

## 2016-10-21 ENCOUNTER — Other Ambulatory Visit (HOSPITAL_COMMUNITY): Payer: Self-pay | Admitting: Family Medicine

## 2016-10-21 DIAGNOSIS — I639 Cerebral infarction, unspecified: Secondary | ICD-10-CM | POA: Diagnosis not present

## 2016-10-21 LAB — HEMOGLOBIN A1C
HEMOGLOBIN A1C: 4.6 % — AB (ref 4.8–5.6)
MEAN PLASMA GLUCOSE: 85 mg/dL

## 2016-10-22 ENCOUNTER — Ambulatory Visit (HOSPITAL_COMMUNITY)
Admission: RE | Admit: 2016-10-22 | Discharge: 2016-10-22 | Disposition: A | Discharge status: Home / Self Care | Payer: BLUE CROSS/BLUE SHIELD | Source: Ambulatory Visit | Attending: Family Medicine | Admitting: Family Medicine

## 2016-10-22 DIAGNOSIS — I10 Essential (primary) hypertension: Secondary | ICD-10-CM | POA: Diagnosis not present

## 2016-10-22 DIAGNOSIS — I639 Cerebral infarction, unspecified: Secondary | ICD-10-CM

## 2016-10-22 DIAGNOSIS — Z8673 Personal history of transient ischemic attack (TIA), and cerebral infarction without residual deficits: Secondary | ICD-10-CM | POA: Diagnosis not present

## 2016-10-22 DIAGNOSIS — E785 Hyperlipidemia, unspecified: Secondary | ICD-10-CM | POA: Diagnosis not present

## 2016-10-22 DIAGNOSIS — R002 Palpitations: Secondary | ICD-10-CM | POA: Diagnosis not present

## 2016-10-22 NOTE — Progress Notes (Signed)
*  PRELIMINARY RESULTS* Vascular Ultrasound Carotid Duplex (Doppler) has been completed.  Preliminary findings: Bilateral: No significant  ICA stenosis. Antegrade vertebral flow.   Jesse Ho Eunice, RDMS, RVT  10/22/2016, 9:26 AM

## 2016-10-25 NOTE — H&P (Signed)
OFFICE VISIT NOTES COPIED TO EPIC FOR DOCUMENTATION  . History of Present Illness Jesse Ho(Jesse H. Kelley FNP-C; 10/24/2016 5:53 PM) Patient words: NP EVAL for post hospital visit for TIA.  The patient is a 41 year old male who presents for a follow-up for Transient ischemic attack. Jesse Ho is a 41 y.o. male with medical history significant of HLD. Patient presents to the ED with c/o intermittent L arm numbness and weakness along with left sided chest pain with minimal exertion. He states he was at work and was not feeling well; therefore, he layed down for an hour. When he woke up he still had tingling and numbness on his left side and drove to ER. Also had associated SOB, L leg paresthesias, mild left facial numbness, back and chest pain. Initial episode lasted 45 min. MRI /MRA BRAIN negative. He was evaluated by neurology, Jesse Ho. He felt symptoms were considered to be non-physiologic. Outpatient PT/OT was recommended. ECHO and EKG at hospital were consistent with LVH, no evidence of PFO. Echo changes felt to be secondary to longstanding HTN. Advised to follow up on routine with outpatient neurology Jesse Ho, that patient has not done at this time. As testing was negative and symptoms had resolved he was advised to follow up with PCP, who has referred him to us for further evaluation. 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  Patient reprots that tingling and numbness continues to come and go and comes on once every few hours and last 20-30 seconds. He continues to have left sided weakness and pain in his leg cramps. Weakness has not worsened. He is able to walk but does have a limp. Foot feels weaker than the rest. He does report having headache that comes on once every hour. Last approximatley 20 secs. Headaches are left sided and radiate to the bed. He does also have occasional flip flop sensation of his heart  when lying in bed. He does report having one episode daily of heart racing and feels like bed will vibrate. Generally comes on only with rest. No reoccurence of chest pain or shortness of breath.  He does not smoke. He reports that he occasionally eats fried foods and red meat, but otherwise feels that he eats healthy.     Problem List/Past Medical Jesse Ho(Jesse H. Kelley, FNP-C; 10/22/2016 4:27 PM) No Known Problems [10/22/2016]: (Marked as Inactive)  Allergies (Jesse Ho; 10/22/2016 2:39 PM) No Known Drug Allergies [10/22/2016]:  Family History Jesse Ho(Jesse Ho; 10/22/2016 2:41 PM) Mother  In good health. no heart attacks or strokes, no known cardiovascular conditions Father  Deceased. at age 42 from homicide; no heart attacks or strokes, no known cardiovascular conditions Sister 1  In good health. 4 yrs older; no heart attacks or strokes, DM but no other known cardiovascular conditions  Social History Jesse Ho(Jesse Ho; 10/22/2016 2:42 PM) Current tobacco use  Never smoker. Non Drinker/No Alcohol Use  Marital status  Married. Living Situation  Lives with spouse. Number of Children  2.  Past Surgical History Jesse Ho(Jesse Ho; 10/22/2016 2:46 PM) Branchial Cyst Removal [10/2014]: Wrist Surgery [1999]: Bilateral. multiple operations through 1995-1999  Medication History (Jesse Ho; 10/22/2016 2:48 PM) Aspirin (81MG  Tablet Jesse, 1 Oral daily) Active. Triamterene-HCTZ (37.5-25MG  Capsule, 1 Oral daily) Active. Medications Reconciled (verbally with pt; no list or medication present)  Diagnostic Studies History Jesse Ho(Jesse Ho; 10/22/2016 2:47 PM) Echocardiogram [10/20/2016]: Carotid Doppler [10/22/2016]:    Review  of Systems Jesse Bouchard FNP-C; 10/24/2016 5:15 PM) General Not Present- Appetite Loss and Weight Gain. Respiratory Not Present- Chronic Cough and Wakes up from Sleep Wheezing or Short of Breath. Cardiovascular Present- Palpitations. Not  Present- Chest Pain, Difficulty Breathing Lying Down and Difficulty Breathing On Exertion. Gastrointestinal Not Present- Black, Tarry Stool and Difficulty Swallowing. Musculoskeletal Not Present- Decreased Range of Motion and Muscle Atrophy. Neurological Present- Headaches (left sided), Numbness (left sided), Trouble walking and Weakness In Extremities (left sided). Not Present- Attention Deficit and Syncope. Psychiatric Not Present- Personality Changes and Suicidal Ideation. Endocrine Not Present- Cold Intolerance and Heat Intolerance. Hematology Not Present- Abnormal Bleeding. All other systems negative  Vitals Jesse Ho Ho; 10/22/2016 2:49 PM) 10/22/2016 2:36 PM Weight: 183.06 lb Height: 66in Body Surface Area: 1.93 m Body Mass Index: 29.55 kg/m  Pulse: 90 (Regular)  P.OX: 98% (Room air) BP: 112/78 (Sitting, Left Arm, Standard)       Physical Exam Jesse Bouchard FNP-C; 10/24/2016 5:14 PM) General Mental Status-Alert. General Appearance-Cooperative and Appears stated age. Build & Nutrition-Moderately built.  Head and Neck Thyroid Gland Characteristics - normal size and consistency and no palpable nodules.  Chest and Lung Exam Chest and lung exam reveals -quiet, even and easy respiratory effort with no use of accessory muscles, non-tender and on auscultation, normal breath sounds, no adventitious sounds.  Cardiovascular Cardiovascular examination reveals -normal heart sounds, regular rate and rhythm with no murmurs, carotid auscultation reveals no bruits, abdominal aorta auscultation reveals no bruits and no prominent pulsation, femoral artery auscultation bilaterally reveals normal pulses, no bruits, no thrills, normal pedal pulses bilaterally and no digital clubbing, cyanosis, edema, increased warmth or tenderness.  Abdomen Palpation/Percussion Palpation and Percussion of the abdomen reveal - Non Tender and No  hepatosplenomegaly.  Neurologic Neurologic evaluation reveals -alert and oriented x 3 with no impairment of recent or remote memory. Motor-Grossly intact without any focal deficits.  Musculoskeletal Global Assessment Left Lower Extremity - no deformities, masses or tenderness, no known fractures. Right Lower Extremity - no deformities, masses or tenderness, no known fractures.    Assessment & Plan Jesse Bouchard FNP-C; 10/24/2016 7:25 PM) History of TIA (transient ischemic attack) (Z86.73) Current Plans Complete electrocardiogram (93000) Palpitations (R00.2) Hyperlipidemia, mild (E78.5) Current Plans Started Atorvastatin Calcium 10MG , 1 (one) Tablet at bedtime, #30, 30 days starting 10/22/2016, Ref. x1. Hypertension, benign (I10) Impression: EKG 10/22/2016: Normal sinus rhythm at 89 bpm, left atrial abnormality, LVH with repolarization abnormality. Abnormal EKG Left-sided weakness (R53.1) Current Plans Mechanism of underlying disease process and action of medications discussed with the patient. I also discussed primary/secondary prevention and dietary counseling was done. 41 year old [place] American male is referred to [place] for evaluation given his recent TIA history. Patient continues to report on and off symptoms of left-sided tingling and numbness. Echocardiogram during hospitalization was negative for any PFO, but did show LVH. Schedule for TEE to better evaluate the structural abnormality. I have explained risks, benefits,alternatives to TEE, including but not limited to rare esophageal perforation, bleeding, aspiration pneumonia. Past medical history significant for hypertension and hyperlipidemia. LDL is slightly elevated, will recommend that he start Lipitor small dose to lower LDL levels to goal of 70. I have recommended that he start ASA 81mg  daily for cardiac protection. Blood pressure is well controlled on current therapy. Physical exam is unremarkable.  Patient does  report episodes of heart racing and palptiations. Will place him on 30 day event montior for further evaluation and to exclude any cardiac  arrhythmias as etiology. He will likely need stress testing given his history and also recent complaint of chest pain on exertion, but will perform TEE and event monitoring prior to this. I will have him follow up after the procedure for further recommendations and re-evaluation.  *I have discussed this case with Jesse. Jacinto Halim and he personally examined the patient and participated in formulating the plan.*  CC: Jesse. Henrine Screws  Signed electronically by Jesse Bouchard, FNP-C (10/24/2016 7:26 PM)

## 2016-10-26 ENCOUNTER — Ambulatory Visit: Payer: BLUE CROSS/BLUE SHIELD | Attending: Internal Medicine | Admitting: Physical Therapy

## 2016-10-26 ENCOUNTER — Ambulatory Visit: Payer: BLUE CROSS/BLUE SHIELD | Admitting: Occupational Therapy

## 2016-10-26 DIAGNOSIS — M79602 Pain in left arm: Secondary | ICD-10-CM | POA: Insufficient documentation

## 2016-10-26 DIAGNOSIS — R208 Other disturbances of skin sensation: Secondary | ICD-10-CM | POA: Diagnosis not present

## 2016-10-26 DIAGNOSIS — M6281 Muscle weakness (generalized): Secondary | ICD-10-CM | POA: Diagnosis not present

## 2016-10-26 DIAGNOSIS — R2681 Unsteadiness on feet: Secondary | ICD-10-CM

## 2016-10-26 DIAGNOSIS — R252 Cramp and spasm: Secondary | ICD-10-CM | POA: Diagnosis not present

## 2016-10-26 DIAGNOSIS — R2689 Other abnormalities of gait and mobility: Secondary | ICD-10-CM

## 2016-10-26 NOTE — Patient Instructions (Signed)
Gastroc / Heel Cord Stretch - On Step    Stand with heels over edge of stair. Holding rail, lower heels until stretch is felt in calf of legs. Repeat _1__ times. Do _3__ times per day.  LEFT LEG - HOLD 30 -60 SECS ; use block or book 2" - 3" high  Copyright  VHI. All rights reserved.  Heel Cord Stretch    Place one leg forward, bent, other leg behind and straight. Lean forward keeping back heel flat. Hold _45-60___ seconds while counting out loud. Repeat with other leg. Repeat _1___ times. Do __4-5__ sessions per day. LEFT LEG PRIORITY!! http://gt2.exer.us/511   Copyright  VHI. All rights reserved.

## 2016-10-27 ENCOUNTER — Ambulatory Visit (HOSPITAL_COMMUNITY): Payer: BLUE CROSS/BLUE SHIELD

## 2016-10-27 ENCOUNTER — Encounter (HOSPITAL_COMMUNITY): Payer: Self-pay | Admitting: *Deleted

## 2016-10-27 ENCOUNTER — Encounter (HOSPITAL_COMMUNITY)
Admission: RE | Disposition: A | Discharge status: Home / Self Care | Payer: Self-pay | Source: Ambulatory Visit | Attending: Cardiology

## 2016-10-27 ENCOUNTER — Ambulatory Visit (HOSPITAL_COMMUNITY)
Admission: RE | Admit: 2016-10-27 | Discharge: 2016-10-27 | Disposition: A | Discharge status: Home / Self Care | Payer: BLUE CROSS/BLUE SHIELD | Source: Ambulatory Visit | Attending: Cardiology | Admitting: Cardiology

## 2016-10-27 DIAGNOSIS — Z7982 Long term (current) use of aspirin: Secondary | ICD-10-CM | POA: Insufficient documentation

## 2016-10-27 DIAGNOSIS — R002 Palpitations: Secondary | ICD-10-CM | POA: Insufficient documentation

## 2016-10-27 DIAGNOSIS — I10 Essential (primary) hypertension: Secondary | ICD-10-CM | POA: Insufficient documentation

## 2016-10-27 DIAGNOSIS — Z8673 Personal history of transient ischemic attack (TIA), and cerebral infarction without residual deficits: Secondary | ICD-10-CM | POA: Diagnosis not present

## 2016-10-27 DIAGNOSIS — R531 Weakness: Secondary | ICD-10-CM | POA: Diagnosis not present

## 2016-10-27 DIAGNOSIS — G459 Transient cerebral ischemic attack, unspecified: Secondary | ICD-10-CM | POA: Diagnosis present

## 2016-10-27 DIAGNOSIS — E785 Hyperlipidemia, unspecified: Secondary | ICD-10-CM | POA: Diagnosis not present

## 2016-10-27 HISTORY — PX: TEE WITHOUT CARDIOVERSION: SHX5443

## 2016-10-27 LAB — VAS US CAROTID
LCCADSYS: -76 cm/s
LEFT ECA DIAS: -15 cm/s
LEFT VERTEBRAL DIAS: 20 cm/s
LICAPDIAS: 17 cm/s
Left CCA dist dias: -18 cm/s
Left CCA prox dias: 32 cm/s
Left CCA prox sys: 108 cm/s
Left ICA dist dias: -37 cm/s
Left ICA dist sys: -93 cm/s
Left ICA prox sys: 59 cm/s
RCCAPDIAS: -19 cm/s
RCCAPSYS: -77 cm/s
RIGHT ECA DIAS: -14 cm/s
RIGHT VERTEBRAL DIAS: 9 cm/s
Right cca dist sys: -53 cm/s

## 2016-10-27 SURGERY — ECHOCARDIOGRAM, TRANSESOPHAGEAL
Anesthesia: Moderate Sedation

## 2016-10-27 MED ORDER — FENTANYL CITRATE (PF) 100 MCG/2ML IJ SOLN
INTRAMUSCULAR | Status: AC
  Filled 2016-10-27: qty 2

## 2016-10-27 MED ORDER — DIPHENHYDRAMINE HCL 50 MG/ML IJ SOLN
INTRAMUSCULAR | Status: DC | PRN
  Administered 2016-10-27: 25 mg via INTRAVENOUS

## 2016-10-27 MED ORDER — MIDAZOLAM HCL 5 MG/ML IJ SOLN
INTRAMUSCULAR | Status: AC
  Filled 2016-10-27: qty 2

## 2016-10-27 MED ORDER — MIDAZOLAM HCL 10 MG/2ML IJ SOLN
INTRAMUSCULAR | Status: DC | PRN
  Administered 2016-10-27: 2 mg via INTRAVENOUS
  Administered 2016-10-27 (×2): 1 mg via INTRAVENOUS

## 2016-10-27 MED ORDER — SODIUM CHLORIDE 0.9 % IV SOLN: INTRAVENOUS | Status: DC

## 2016-10-27 MED ORDER — BUTAMBEN-TETRACAINE-BENZOCAINE 2-2-14 % EX AERO
INHALATION_SPRAY | CUTANEOUS | Status: DC | PRN
  Administered 2016-10-27: 2 via TOPICAL

## 2016-10-27 MED ORDER — FENTANYL CITRATE (PF) 100 MCG/2ML IJ SOLN
INTRAMUSCULAR | Status: DC | PRN
  Administered 2016-10-27 (×2): 25 ug via INTRAVENOUS

## 2016-10-27 NOTE — Therapy (Signed)
[facility] [address] [place], [place], [address] Phone: [phone]   Fax:  [phone]  Physical Therapy Evaluation  Patient Details  Name: Jesse Ho MRN: [id number] Date of Birth: 16-Jan-1976 Referring Provider: Richarda Overlie, MD  Cc:  Dr. Henrine Screws  Encounter Date: 10/26/2016      PT End of Session - 10/27/16 0951    Visit Number 1   Number of Visits 9   Date for PT Re-Evaluation 11/26/16   Authorization Type BCBS   PT Start Time 1447   PT Stop Time 1531   PT Time Calculation (min) 44 min      Past Medical History:  Diagnosis Date  . Hyperlipidemia     Past Surgical History:  Procedure Laterality Date  . CYSTECTOMY Left 10/19/2014   left branchial cleft cyst removed  . FRACTURE SURGERY     fx fingers on left hand playing football  . TEE WITHOUT CARDIOVERSION N/A 10/27/2016   Procedure: TRANSESOPHAGEAL ECHOCARDIOGRAM (TEE);  Surgeon: Yates Decamp, MD;  Location: [facility] ENDOSCOPY;  Service: Cardiovascular;  Laterality: N/A;    There were no vitals filed for this visit.       Subjective Assessment - 10/27/16 0941    Subjective Pt was admitted to [facility] last Mon., 10-19-16 with left sided numbness; says tests were inconclusive with some reports saying ischemic CVA and some saying TIA; pt reports he continues to have tingling on Lt side of body and weakness in LUE and LLE   Pertinent History wearing heart monitor x 10 days; Lt cervical brachial cyst removed 10/2014:  cervical dystonia Dec. 2017   Patient Stated Goals improve balance - be able to return to work - improve walking   Currently in Pain? Yes   Pain Score 7    Pain Location Arm   Pain Orientation Left   Pain Descriptors / Indicators Tingling;Squeezing;Aching   Pain Type Acute pain   Pain Onset In the past 7 days   Pain Frequency Intermittent   Multiple Pain Sites Yes   Pain Score 5   Pain Location Leg   Pain Orientation Left    Pain Descriptors / Indicators Squeezing;Tingling   Pain Type Acute pain   Pain Frequency Intermittent            [facility] PT Assessment - 10/27/16 0001      Assessment   Medical Diagnosis Ischemic CVA   Referring Provider Richarda Overlie, MD   Onset Date/Surgical Date 10/19/16     Precautions   Precautions None     Restrictions   Weight Bearing Restrictions No     Balance Screen   Has the patient fallen in the past 6 months No   Has the patient had a decrease in activity level because of a fear of falling?  No   Is the patient reluctant to leave their home because of a fear of falling?  No     Home Environment   Living Environment Private residence   Type of Home House   Home Access Level entry   Home Layout Two level   Alternate Level Stairs-Number of Steps 12     Prior Function   Level of Independence Independent     ROM / Strength   AROM / PROM / Strength Strength     Strength   Overall Strength Deficits   Strength Assessment Site Hip;Knee;Ankle   Right/Left Hip Left   Left Hip Flexion 3+/5   Left  Hip Extension 3-/5   Left Hip ABduction 3+/5   Right/Left Knee Left   Left Knee Flexion 3+/5   Left Knee Extension 4-/5   Right/Left Ankle Left   Left Ankle Dorsiflexion 4/5   Left Ankle Plantar Flexion 2-/5     Ambulation/Gait   Ambulation/Gait Yes   Ambulation/Gait Assistance 6: Modified independent (Device/Increase time)   Ambulation Distance (Feet) 100 Feet   Assistive device None   Gait Pattern Step-through pattern;Decreased weight shift to left   Ambulation Surface Level;Indoor   Gait velocity 12.58=2.61     Standardized Balance Assessment   Standardized Balance Assessment Timed Up and Go Test     Timed Up and Go Test   Normal TUG (seconds) 10.13                           PT Education - 10/27/16 0951    Education provided Yes   Education Details edcuated pt in Lt hamstrings and heel cord stretch   Person(s) Educated Patient    Methods Explanation;Demonstration;Handout   Comprehension Verbalized understanding;Returned demonstration             PT Long Term Goals - 10/27/16 2103      PT LONG TERM GOAL #1   Title Pt will report at least 50% improvement in LLE strength and functional use.  11-26-16   Time 4   Period Weeks   Status New     PT LONG TERM GOAL #2   Title Pt perform SLS on LLE at least 4 secs for improved high level balance skills.   11-26-16   Baseline pt unable to stand on LLE without UE support   Time 4   Period Weeks   Status New     PT LONG TERM GOAL #3   Title Improve gait velocity to >/= 3.2 ft/sec without device for incr. gait efficiency.  11-26-16   Time 4   Period Weeks   Status New     PT LONG TERM GOAL #4   Title Negotiate 4 steps without hand rail using a step over step sequence to demo improved LE strength and balance.  11-26-16   Time 4   Period Weeks   Status New     PT LONG TERM GOAL #5   Title Independent in HEP for LLE strengthening and high level balance skills.  11-26-16   Time 4   Period Weeks   Status New               Plan - 10/27/16 [id number]    Clinical Impression Statement Pt is a 41 year old male s/p ischemic CVA?/ TIA?  on 10-19-16.   Pt was [facility] on 10[facility]19-16 and discharged on 10-20-16.   He presents with c/o Lt facial numbness, c/o tingling and squeezing sensation in LUE and LLE and weakness in LLE which is contributing to gait deviations.  Pt also presents with decr. high level balance skills, i.e. inability to perform SLS on LLE.  Pt reports significant cramping in Lt gastrocnemius.  PMH includes cervical dystonia with cystectomy (Lt brachial cleft cyst removed in May 2016) and c/o cervical pain.  Pt is currently wearing a heart monitor and has transesophageal echocardiogram scheduled on 10-27-16.  Moderate complexity eval with moderate decision making required in POC.  Rehab Potential Good   PT Frequency 2x / week   PT Duration 4 weeks   PT Treatment/Interventions ADLs/Self Care Home Management;Functional mobility training;Stair training;Therapeutic activities;Therapeutic exercise;Balance training;Neuromuscular re-education;Patient/family education;Passive range of motion;Gait training   PT Next Visit Plan begin HEP for LLE strengthening - needs hip, knee and ankle musc strengthening   PT Home Exercise Plan stretches given on 10-26-16   Recommended Other Services pt is scheduled to receive OT for LUE deficits   Consulted and Agree with Plan of Care Patient      Patient will benefit from skilled therapeutic intervention in order to improve the following deficits and impairments:  Abnormal gait, Decreased balance, Decreased endurance, Impaired tone, Increased muscle spasms, Decreased strength, Impaired sensation  Visit Diagnosis: Cramp and spasm - Plan: PT plan of care cert/re-cert  Other abnormalities of gait and mobility - Plan: PT plan of care cert/re-cert  Unsteadiness on feet - Plan: PT plan of care cert/re-cert  Muscle weakness (generalized) - Plan: PT plan of care cert/re-cert     Problem List Patient Active Problem List   Diagnosis Date Noted  . Left facial numbness 10/20/2016  . TIA (transient ischemic attack)   . Cervical dystonia 05/20/2016    Kary Kos, PT 10/27/2016, 9:16 PM  [facility] [address] [place], [place], [address] Phone: [phone]   Fax:  [phone]  Name: Jesse Ho MRN: [id number] Date of Birth: 05-26-1976

## 2016-10-27 NOTE — CV Procedure (Signed)
TEE: Under moderate sedation, TEE was performed without complications: LV: Normal. Normal EF. RV: Normal LA: Normal. Left atrial appendage: Normal without thrombus. Normal function. Inter atrial septum is intact without defect. Double contrast study negative for atrial level shunting. No late appearance of bubbles either. RA: Normal  MV: Normal Trace MR. TV: Normal Trace TR AV: Normal. No AI or AS. PV: Normal. Trace PI.  Thoracic and ascending aorta:  Very mild atheromatous changes.  Conscious sedation protocol was followed, I personally administered conscious sedation and monitored the patient. Patient received 4 milligrams of Versed and 50 mcg fentanyl and 25 mg Benadryl . Patient tolerated the procedure well and there was no complication from conscious sedation. Time administered was 15 minutes.

## 2016-10-27 NOTE — Interval H&P Note (Signed)
History and Physical Interval Note:  10/27/2016 12:11 PM  Jesse Ho  has presented today for surgery, with the diagnosis of HISTORY OF TIA LEFT SIDE OF WEAKNESS  The various methods of treatment have been discussed with the patient and family. After consideration of risks, benefits and other options for treatment, the patient has consented to  Procedure(s): TRANSESOPHAGEAL ECHOCARDIOGRAM (TEE) (N/A) as a surgical intervention .  The patient's history has been reviewed, patient examined, no change in status, stable for surgery.  I have reviewed the patient's chart and labs.  Questions were answered to the patient's satisfaction.     Yates DecampGANJI, Michail Boyte

## 2016-10-27 NOTE — Progress Notes (Signed)
  Echocardiogram Echocardiogram Transesophageal has been performed.  Jesse Ho, Jesse Ho 10/27/2016, 4:28 PM

## 2016-10-27 NOTE — Discharge Instructions (Signed)

## 2016-11-04 ENCOUNTER — Encounter: Payer: Self-pay | Admitting: Neurology

## 2016-11-04 ENCOUNTER — Encounter: Payer: Self-pay | Admitting: Occupational Therapy

## 2016-11-04 ENCOUNTER — Encounter: Payer: Self-pay | Admitting: Physical Therapy

## 2016-11-04 ENCOUNTER — Ambulatory Visit: Payer: BLUE CROSS/BLUE SHIELD | Admitting: Physical Therapy

## 2016-11-04 ENCOUNTER — Ambulatory Visit (INDEPENDENT_AMBULATORY_CARE_PROVIDER_SITE_OTHER): Payer: BLUE CROSS/BLUE SHIELD | Admitting: Neurology

## 2016-11-04 ENCOUNTER — Ambulatory Visit: Payer: BLUE CROSS/BLUE SHIELD | Admitting: Occupational Therapy

## 2016-11-04 VITALS — BP 148/100

## 2016-11-04 VITALS — BP 146/102 | HR 69 | Ht 66.0 in | Wt 186.0 lb

## 2016-11-04 VITALS — BP 140/100

## 2016-11-04 DIAGNOSIS — R2681 Unsteadiness on feet: Secondary | ICD-10-CM

## 2016-11-04 DIAGNOSIS — R2689 Other abnormalities of gait and mobility: Secondary | ICD-10-CM | POA: Diagnosis not present

## 2016-11-04 DIAGNOSIS — R531 Weakness: Secondary | ICD-10-CM | POA: Diagnosis not present

## 2016-11-04 DIAGNOSIS — R208 Other disturbances of skin sensation: Secondary | ICD-10-CM | POA: Diagnosis not present

## 2016-11-04 DIAGNOSIS — M6281 Muscle weakness (generalized): Secondary | ICD-10-CM | POA: Diagnosis not present

## 2016-11-04 DIAGNOSIS — M940 Chondrocostal junction syndrome [Tietze]: Secondary | ICD-10-CM | POA: Diagnosis not present

## 2016-11-04 DIAGNOSIS — M79602 Pain in left arm: Secondary | ICD-10-CM | POA: Diagnosis not present

## 2016-11-04 DIAGNOSIS — R200 Anesthesia of skin: Secondary | ICD-10-CM | POA: Diagnosis not present

## 2016-11-04 DIAGNOSIS — I10 Essential (primary) hypertension: Secondary | ICD-10-CM | POA: Diagnosis not present

## 2016-11-04 DIAGNOSIS — R252 Cramp and spasm: Secondary | ICD-10-CM | POA: Diagnosis not present

## 2016-11-04 NOTE — Therapy (Signed)
[facility] [address] [place], [place], [address] Phone: [phone]   Fax:  [phone]  Occupational Therapy Evaluation  Patient Details  Name: Tamera PuntDavid G Glasheen MRN: [id number] Date of Birth: 07/15/1975 Referring Provider: Dr. Richarda OverlieNayana Abrol  CC. Henrine Screwsobert Thacker  Encounter Date: 11/04/2016      OT End of Session - 11/04/16 1147    Visit Number 1   Number of Visits 8   Date for OT Re-Evaluation 12/02/16   Authorization Type BCBS no visit limits   OT Start Time 1021   OT Stop Time 1101   OT Time Calculation (min) 40 min   Activity Tolerance Patient tolerated treatment well      Past Medical History:  Diagnosis Date  . Cervical dystonia   . CVA (cerebral vascular accident) (HCC)   . Hyperlipidemia   . Hypertension     Past Surgical History:  Procedure Laterality Date  . CYSTECTOMY Left 10/19/2014   left branchial cleft cyst removed  . FRACTURE SURGERY     fx fingers on left hand playing football  . TEE WITHOUT CARDIOVERSION N/A 10/27/2016   Procedure: TRANSESOPHAGEAL ECHOCARDIOGRAM (TEE);  Surgeon: Yates DecampGanji, Jay, MD;  [facility];  Service: Cardiovascular;  Laterality: N/A;    Vitals:   11/04/16 1024  BP: (!) 148/100        Subjective Assessment - 11/04/16 1024    Currently in Pain? Yes   Pain Score 4    Pain Location Arm   Pain Orientation Left   Pain Descriptors / Indicators Pins and needles;Tingling;Aching;Tightness   Pain Onset 1 to 4 weeks ago   Pain Frequency Intermittent   Aggravating Factors  If I move it alot    Pain Relieving Factors Not really    Multiple Pain Sites Yes   Pain Score 4   Pain Location Leg   Pain Orientation Left   Pain Descriptors / Indicators Sore   Pain Type Acute pain   Pain Onset 1 to 4 weeks ago   Pain Frequency Intermittent   Aggravating Factors  When I first get up its worse   Pain Relieving Factors nothing really.           [facility]  Assessment - 11/04/16 0001      Assessment   Diagnosis TIA   Referring Provider Dr. Richarda OverlieNayana Abrol  CC. Henrine Screwsobert Thacker   Onset Date 10/19/16   Prior Therapy acute OT, PT and ST     Precautions   Precautions None     Restrictions   Weight Bearing Restrictions No     Balance Screen   Has the patient fallen in the past 6 months No  Pt currently seeing PT     Home  Environment   Family/patient expects to be discharged to: Private residence   Living Arrangements Spouse/significant other  42 year old and 42 year old children in the home   Available Help at Discharge Available PRN/intermittently   Type of Home House   Home Layout Two level   Bathroom Shower/Tub Tub/Shower unit   Information systems managerBathroom Toilet Standard   Additional Comments no equipment in bathroom     Prior Function   Level of Independence Independent   Vocation Full time employment   Occupational psychologistVocation Requirements design websites   Leisure doing stuff on the computer, photograhy     ADL   Eating/Feeding Independent   Grooming Independent   Upper Body Bathing Independent   Lower Body Bathing Independent  Upper Body Dressing Independent   Lower Body Dressing Independent   Toilet Transfer Independent   Toileting - Clothing Manipulation Independent   Toileting -  Hygiene Independent     IADL   Shopping Takes care of all shopping needs independently   Light Housekeeping Performs light daily tasks but cannot maintain acceptable level of cleanliness   Meal Prep Plans, prepares and serves adequate meals independently   Community Mobility Drives own vehicle   Medication Management Is responsible for taking medication in correct dosages at correct time   Development worker, community financial matters independently (budgets, writes checks, pays rent, bills goes to bank), collects and keeps track of income     Mobility   Mobility Status Independent     Written Expression   Dominant Hand Right     Vision - History   Baseline Vision  Wears glasses all the time   Additional Comments Pt denies any visual issues prior to stroke .  Pt feels vision is slighter blurrier but can still read with readers but can't read without them which he could do before.      Vision Assessment   Eye Alignment Within Functional Limits   Ocular Range of Motion Within Functional Limits   Tracking/Visual Pursuits Able to track stimulus in all quads without difficulty   Saccades Within functional limits   Convergence Within functional limits     Activity Tolerance   Activity Tolerance Tolerate 30+ min activity without fatigue   Activity Tolerance Comments Reports that he now is taking naps in the afternoon but can't sleep at night.       Cognition   Overall Cognitive Status Within Functional Limits for tasks assessed     Sensation   Light Touch Impaired by gross assessment   Hot/Cold Appears Intact   Proprioception Appears Intact     Coordination   Gross Motor Movements are Fluid and Coordinated Yes   Fine Motor Movements are Fluid and Coordinated Yes   9 Hole Peg Test Right;Left   Right 9 Hole Peg Test 20.85   Left 9 Hole Peg Test 22.71     Edema   Edema pt reports he gets occasional swelling in L hand; not present today     Tone   Assessment Location Left Upper Extremity     ROM / Strength   AROM / PROM / Strength AROM;Strength     AROM   Overall AROM  Within functional limits for tasks performed   Overall AROM Comments BUE's     Strength   Overall Strength Deficits   Overall Strength Comments R shoulder flexion/abduction 3+/5 - with cueing pt better able to resist.     Hand Function   Right Hand Gross Grasp Functional   Right Hand Grip (lbs) 120   Left Hand Gross Grasp Impaired   Left Hand Grip (lbs) 40     LUE Tone   LUE Tone Within Functional Limits                              OT Long Term Goals - 11/04/16 1137      OT LONG TERM GOAL #1   Title Pt will be mod I with HEP - 12/02/2016    Status New     OT LONG TERM GOAL #2   Title Pt will demonstrate improved grip strength by at least 5 pounds to assist with functional tasks.   Status New  OT LONG TERM GOAL #3   Title Pt will be able to lift 4 pound object off mid to overhead shelf x3 without dropping with LUE   Status New               Plan - 11/04/16 1139    Clinical Impression Statement Pt is a 41 year old male s/p TIA on 10/19/2016 as well as episdode of cervical dysonia in December of 2017 (now resolved).  Pt was discharged from hospital on 10/20/2016.  Pt presents today with the following impairments that impact indepdendence in IADL's, work and leisure: LUE weakness, impaired sensation of LUE and LLE with neuropathic pain (pt in consistent with report of onset of this however medical records indicate this has been present since 2016 from brachial cyst).  Pt will benefit from short course of OT to address these deficits to maximize functional use of LUE.    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  Pt currently reporting racing heart, palpitations and wearing heart monitor x10 days. Pt also with HTN today in clinic and with neurology visit - pt strongly enocuraged to contact primary MD today to follow up given recent TIA. Pt verbalized understanding and stated he would call today.    Occupational performance deficits (Please refer to evaluation for details): IADL's;Work;Leisure   Rehab Potential Fair   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, decreased use of LUE since 2016    OT Frequency 2x / week   OT Duration 4 weeks   OT Treatment/Interventions Self-care/ADL training;Therapeutic exercise;Neuromuscular education;DME and/or AE instruction;Therapeutic activities;Patient/family education   Clinical  Decision Making Limited treatment options, no task modification necessary   Consulted and Agree with Plan of Care Patient      Patient will benefit from skilled therapeutic intervention in order to improve the following deficits and impairments:  Decreased strength, Impaired UE functional use, Impaired sensation, Pain, Impaired perceived functional ability  Visit Diagnosis: Muscle weakness (generalized) - Plan: Ot plan of care cert/re-cert  Other disturbances of skin sensation - Plan: Ot plan of care cert/re-cert  Pain in left arm - Plan: Ot plan of care cert/re-cert    Problem List Patient Active Problem List   Diagnosis Date Noted  . Left-sided weakness 11/04/2016  . Left facial numbness 10/20/2016  . TIA (transient ischemic attack)   . Cervical dystonia 05/20/2016    Norton Pastel, OTR/L 11/04/2016, 11:49 AM  [facility] [address] [place], [place], [address] Phone: [phone]   Fax:  [phone]  Name: BENJAMYN HESTAND MRN: [id number] Date of Birth: 1976/03/04

## 2016-11-04 NOTE — Progress Notes (Signed)
PATIENT: Jesse Ho DOB: Nov 20, 1975  Chief Complaint  Patient presents with  . Cerebrovascular Accident    He is here for hospital follow up after having a stroke on 10/19/16.  He starts PT/OT this week for his left-sided weakness.     HISTORICAL  Jesse Ho is a 41 years old right-handed male, he is here to follow-up his most recent hospital discharge.   He had a history of hyperlipidemia, presented to the emergency room on Oct 19 2016 complaining of intermittent left arm numbness and weakness, left-sided chest pain, had extensive evaluations, I personally reviewed MRI of the brain that was essentially normal, MRA of the brain was normal, Echocardiogram on Oct 27 2016 showed no significant abnormality, wall motion was normal, Ultrasound of carotid artery on Oct 22 2016 showed no significant abnormality  Laboratory evaluation showed normal TSH, B12, A1c was 4.6, lipid profile showed cholesterol of 149, LDL of 97,, normal liver functional tests, normal CBC, BMP,  Per patient, he had more cardiac evaluation by cardiologist Dr. Jacinto Halim.  I saw him in December 2017, referred by my colleague Dr. Marjory Lies for evaluation of potential  EMG guided botulism toxin A injection for abnormal neck posturing, left-sided neck pain, his primary care physician is Dr. 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  For a while, he complains that his symptoms have gradually getting worse, was noted to has mild abnormal posturing, mild right shoulder elevation, left tilt, slight retrocollis. He is referred for  potential EMG guided botulism toxin injection,  CT of soft tissue neck with contrast in December 2017 postsurgical change of left cervical brachial cleft cyst, no complicating features  On today's interview, he reported improvement of neck posturing, there was no limitation on neck movement.  Today, he focus on continued left arm and leg weakness, intermittent severe muscle spasm, also left mouth corner of droopy, intermittent paresthesia involving different part of left side of body, there was a concern of nonphysiological component in his complains  REVIEW OF SYSTEMS: Full 14 system review of systems performed and notable only for numbness, weakness, facial drooping, muscle cramps   ALLERGIES: Allergies  Allergen Reactions  . Keflex [Cephalexin] Anaphylaxis    HOME MEDICATIONS: Current Outpatient Prescriptions  Medication Sig Dispense Refill  . aspirin EC 81 MG tablet Take 1 tablet (81 mg total) by mouth daily. 30 tablet 2  . atorvastatin (LIPITOR) 10 MG tablet Take 10 mg by mouth daily.  1  . triamterene-hydrochlorothiazide (DYAZIDE) 37.5-25 MG capsule Take 1 each (1 capsule total) by mouth daily. 30 capsule 11   No current facility-administered medications for this visit.     PAST MEDICAL HISTORY: Past Medical History:  Diagnosis Date  . Cervical dystonia   . CVA (cerebral vascular accident) (HCC)   . Hyperlipidemia   . Hypertension     PAST SURGICAL HISTORY: Past Surgical History:  Procedure Laterality Date  . CYSTECTOMY Left 10/19/2014   left branchial cleft cyst removed  . FRACTURE SURGERY     fx fingers on left hand playing football  . TEE WITHOUT  CARDIOVERSION N/A 10/27/2016   Procedure: TRANSESOPHAGEAL ECHOCARDIOGRAM (TEE);  Surgeon: Yates Decamp, MD;  [facility];  Service: Cardiovascular;  Laterality: N/A;    FAMILY HISTORY: Family History  Problem Relation Age of Onset  . Hyperlipidemia Other   . Hypertension Other   . Diabetes Other   .  Obesity Other   . Diabetes Sister     SOCIAL HISTORY:  Social History   Social History  . Marital status: Married    Spouse name: Lowella Bandy  . Number of children: 2  . Years of education: 12+   Occupational History  .      [employer], designs websites   Social History Main Topics  . Smoking status: Never Smoker  . Smokeless tobacco: Never Used  . Alcohol use Yes  . Drug use: No  . Sexual activity: Not on file   Other Topics Concern  . Not on file   Social History Narrative   Lives at home w/ his wife and children   Right-handed   Caffeine: 3 cups of coffee/tea     PHYSICAL EXAM   Vitals:   11/04/16 0731  BP: (!) 146/102  Pulse: 69  Weight: 186 lb (84.4 kg)  Height: 5\' 6"  (1.676 m)    Not recorded      Body mass index is 30.02 kg/m.  PHYSICAL EXAMNIATION:  Gen: NAD, conversant, well nourised, obese, well groomed                     Cardiovascular: Regular rate rhythm, no peripheral edema, warm, nontender. Eyes: Conjunctivae clear without exudates or hemorrhage Neck: Supple, no carotid bruits. Pulmonary: Clear to auscultation bilaterally   NEUROLOGICAL EXAM:  MENTAL STATUS: Speech:    Speech is normal; fluent and spontaneous with normal comprehension.  Cognition:     Orientation to time, place and person     Normal recent and remote memory     Normal Attention span and concentration     Normal Language, naming, repeating,spontaneous speech     Fund of knowledge   CRANIAL NERVES: CN II: Visual fields are full to confrontation. Fundoscopic exam is normal with sharp discs and no vascular changes. Pupils are round equal and briskly reactive to light. CN III, IV, VI: extraocular movement are normal. No ptosis. CN V: Facial sensation is intact to pinprick in all 3 divisions bilaterally. Corneal responses are intact.  CN VII: Face is symmetric with normal eye closure and smile. CN VIII: Hearing is normal to rubbing fingers CN IX, X: Palate elevates  symmetrically. Phonation is normal. CN XI: Head turning and shoulder shrug are intact CN XII: Tongue is midline with normal movements and no atrophy.  MOTOR: Variable effort, fixation of left upper extremity upon rapid rotating movement   REFLEXES: Reflexes are 2+ and symmetric at the biceps, triceps, knees, and ankles. Plantar responses are flexor.  SENSORY: Intact to light touch, pinprick, positional sensation and vibratory sensation are intact in fingers and toes.  COORDINATION: Rapid alternating movements and fine finger movements are intact. There is no dysmetria on finger-to-nose and heel-knee-shin.    GAIT/STANCE: Posture is normal. Gait is steady with normal steps, mild difficulty perform tandem walking    DIAGNOSTIC DATA (LABS, IMAGING, TESTING) - I reviewed patient records, labs, notes, testing and imaging myself where available.   ASSESSMENT AND PLAN  Jesse Ho is a 41 y.o. male   Left upper and lower extremity muscle spasm, intermittent paresthesia   EMG/NCS to  rule out peripheral nervous system etiology  MRI of cervical spine  Continue aspirin 81 mg daily  Continue to address hyperlipidemia, hypertension His abnormal neck posturing has much improved, will cancel botulism toxin injection    Levert FeinsteinYijun Ariona Deschene, M.D. Ph.D.  [facility] [address], [address] [place], [place] [address] Ph: [phone] Fax: [phone]  CC: Referring Provider

## 2016-11-04 NOTE — Therapy (Signed)
[facility] [address] [place], [place], [address] Phone: [phone]   Fax:  [phone]  Physical Therapy Treatment  Patient Details  Name: Jesse Ho MRN: [id number] Date of Birth: [birth year] Referring Provider: Richarda Overlie, MD  Encounter Date: 11/04/2016      PT End of Session - 11/04/16 1004    Visit Number 2   Number of Visits 9   Date for PT Re-Evaluation 11/26/16   Authorization Type BCBS   PT Start Time [phone]   PT Stop Time 0957  ended eaerly due to BP issues   PT Time Calculation (min) 26 min   Activity Tolerance Treatment limited secondary to medical complications (Comment)      Past Medical History:  Diagnosis Date  . Cervical dystonia   . CVA (cerebral vascular accident) (HCC)   . Hyperlipidemia   . Hypertension     Past Surgical History:  Procedure Laterality Date  . CYSTECTOMY Left 10/19/2014   left branchial cleft cyst removed  . FRACTURE SURGERY     fx fingers on left hand playing football  . TEE WITHOUT CARDIOVERSION N/A 10/27/2016   Procedure: TRANSESOPHAGEAL ECHOCARDIOGRAM (TEE);  Surgeon: Yates Decamp, MD;  Location: [facility] ENDOSCOPY;  Service: Cardiovascular;  Laterality: N/A;    Vitals:   11/04/16 1010 11/04/16 1651  BP: (!) 148/96 (!) 140/100        Subjective Assessment - 11/04/16 1010    Subjective Reports numbness in Left hand. BP at Dr.Yan's appointment this morning was 146/102.     Pertinent History wearing heart monitor x 10 days; Lt cervical brachial cyst removed 10/2014:  cervical dystonia Dec. 2017   Patient Stated Goals improve balance - be able to return to work - improve walking   Currently in Pain? Yes   Pain Score 4    Pain Location Arm   Pain Orientation Left   Pain Descriptors / Indicators Tingling;Squeezing   Pain Type Acute pain   Pain Onset In the past 7 days   Pain Frequency Constant   Pain Score 4   Pain Location Leg   Pain Orientation Left   Pain  Descriptors / Indicators Squeezing   Pain Type Acute pain   Pain Onset In the past 7 days   Pain Frequency Intermittent                                 PT Education - 11/04/16 1013    Education provided Yes   Education Details CVA EDU; BP and following up with PCP.   Person(s) Educated Patient   Methods Explanation;Demonstration;Handout   Comprehension Verbalized understanding             PT Long Term Goals - 10/27/16 2103      PT LONG TERM GOAL #1   Title Pt will report at least 50% improvement in LLE strength and functional use.  11-26-16   Time 4   Period Weeks   Status New     PT LONG TERM GOAL #2   Title Pt perform SLS on LLE at least 4 secs for improved high level balance skills.   11-26-16   Baseline pt unable to stand on LLE without UE support   Time 4   Period Weeks   Status New     PT LONG TERM GOAL #3   Title Improve gait velocity to >/= 3.2 ft/sec without device for  incr. gait efficiency.  11-26-16   Time 4   Period Weeks   Status New     PT LONG TERM GOAL #4   Title Negotiate 4 steps without hand rail using a step over step sequence to demo improved LE strength and balance.  11-26-16   Time 4   Period Weeks   Status New     PT LONG TERM GOAL #5   Title Independent in HEP for LLE strengthening and high level balance skills.  11-26-16   Time 4   Period Weeks   Status New               Plan - 11/04/16 1014    Clinical Impression Statement Discussed BP concerns with PT and recommend pt follow up with PCP and cancelled next appointment to give pt time for any BP medication changes.  Reviewed CVA edu.   Rehab Potential Good   PT Frequency 2x / week   PT Duration 4 weeks   PT Treatment/Interventions ADLs/Self Care Home Management;Functional mobility training;Stair training;Therapeutic activities;Therapeutic exercise;Balance training;Neuromuscular re-education;Patient/family education;Passive range of motion;Gait training    PT Next Visit Plan begin HEP for LLE strengthening - needs hip, knee and ankle musc strengthening   PT Home Exercise Plan stretches given on 10-26-16   Consulted and Agree with Plan of Care Patient      Patient will benefit from skilled therapeutic intervention in order to improve the following deficits and impairments:  Abnormal gait, Decreased balance, Decreased endurance, Impaired tone, Increased muscle spasms, Decreased strength, Impaired sensation  Visit Diagnosis: Other abnormalities of gait and mobility  Unsteadiness on feet  Muscle weakness (generalized)     Problem List Patient Active Problem List   Diagnosis Date Noted  . Left-sided weakness 11/04/2016  . Left facial numbness 10/20/2016  . TIA (transient ischemic attack)   . Cervical dystonia 05/20/2016    Hortencia ConradiKarissa Karmela Bram, PTA  11/04/16, 4:56 PM [facility] [address] [place], [place], [address] Phone: [phone]   Fax:  [phone]  Name: Jesse Ho MRN: [id number] Date of Birth: 04/02/1976

## 2016-11-04 NOTE — Patient Instructions (Signed)
Stroke Prevention Some medical conditions and behaviors are associated with an increased chance of having a stroke. You may prevent a stroke by making healthy choices and managing medical conditions. How can I reduce my risk of having a stroke?  Stay physically active. Get at least 30 minutes of activity on most or all days.  Do not smoke. It may also be helpful to avoid exposure to secondhand smoke.  Limit alcohol use. Moderate alcohol use is considered to be:  No more than 2 drinks per day for men.  No more than 1 drink per day for nonpregnant women.  Eat healthy foods. This involves:  Eating 5 or more servings of fruits and vegetables a day.  Making dietary changes that address high blood pressure (hypertension), high cholesterol, diabetes, or obesity.  Manage your cholesterol levels.  Making food choices that are high in fiber and low in saturated fat, trans fat, and cholesterol may control cholesterol levels.  Take any prescribed medicines to control cholesterol as directed by your health care provider.  Manage your diabetes.  Controlling your carbohydrate and sugar intake is recommended to manage diabetes.  Take any prescribed medicines to control diabetes as directed by your health care provider.  Control your hypertension.  Making food choices that are low in salt (sodium), saturated fat, trans fat, and cholesterol is recommended to manage hypertension.  Ask your health care provider if you need treatment to lower your blood pressure. Take any prescribed medicines to control hypertension as directed by your health care provider.  If you are 18-39 years of age, have your blood pressure checked every 3-5 years. If you are 40 years of age or older, have your blood pressure checked every year.  Maintain a healthy weight.  Reducing calorie intake and making food choices that are low in sodium, saturated fat, trans fat, and cholesterol are recommended to manage  weight.  Stop drug abuse.  Avoid taking birth control pills.  Talk to your health care provider about the risks of taking birth control pills if you are over 35 years old, smoke, get migraines, or have ever had a blood clot.  Get evaluated for sleep disorders (sleep apnea).  Talk to your health care provider about getting a sleep evaluation if you snore a lot or have excessive sleepiness.  Take medicines only as directed by your health care provider.  For some people, aspirin or blood thinners (anticoagulants) are helpful in reducing the risk of forming abnormal blood clots that can lead to stroke. If you have the irregular heart rhythm of atrial fibrillation, you should be on a blood thinner unless there is a good reason you cannot take them.  Understand all your medicine instructions.  Make sure that other conditions (such as anemia or atherosclerosis) are addressed. Get help right away if:  You have sudden weakness or numbness of the face, arm, or leg, especially on one side of the body.  Your face or eyelid droops to one side.  You have sudden confusion.  You have trouble speaking (aphasia) or understanding.  You have sudden trouble seeing in one or both eyes.  You have sudden trouble walking.  You have dizziness.  You have a loss of balance or coordination.  You have a sudden, severe headache with no known cause.  You have new chest pain or an irregular heartbeat. Any of these symptoms may represent a serious problem that is an emergency. Do not wait to see if the symptoms will go away.   Get medical help at once. Call your local emergency services (911 in [place] Do not drive yourself to the hospital. This information is not intended to replace advice given to you by your health care provider. Make sure you discuss any questions you have with your health care provider. Document Released: 07/02/2004 Document Revised: 10/31/2015 Document Reviewed: 11/25/2012 Elsevier  Interactive Patient Education  2017 Elsevier Inc.  

## 2016-11-09 ENCOUNTER — Ambulatory Visit: Payer: BLUE CROSS/BLUE SHIELD | Admitting: Physical Therapy

## 2016-11-10 ENCOUNTER — Ambulatory Visit: Payer: BLUE CROSS/BLUE SHIELD | Attending: Internal Medicine | Admitting: Occupational Therapy

## 2016-11-10 ENCOUNTER — Ambulatory Visit: Payer: BLUE CROSS/BLUE SHIELD | Admitting: Physical Therapy

## 2016-11-10 ENCOUNTER — Encounter: Payer: Self-pay | Admitting: Occupational Therapy

## 2016-11-10 VITALS — BP 149/103

## 2016-11-10 DIAGNOSIS — M79602 Pain in left arm: Secondary | ICD-10-CM | POA: Insufficient documentation

## 2016-11-10 DIAGNOSIS — R2689 Other abnormalities of gait and mobility: Secondary | ICD-10-CM | POA: Diagnosis not present

## 2016-11-10 DIAGNOSIS — R208 Other disturbances of skin sensation: Secondary | ICD-10-CM | POA: Diagnosis not present

## 2016-11-10 DIAGNOSIS — R2681 Unsteadiness on feet: Secondary | ICD-10-CM | POA: Insufficient documentation

## 2016-11-10 DIAGNOSIS — M6281 Muscle weakness (generalized): Secondary | ICD-10-CM | POA: Diagnosis not present

## 2016-11-10 NOTE — Patient Instructions (Signed)
1. Grip Strengthening (Resistive Putty)  Green Putty - Make sure you do long hard squeezes not small short little squeezes.  Between each squeeze, make the putty into a fat hot dog in order to keep the resistance up.     Squeeze putty using thumb and all fingers. Repeat _20___ times. Do __2__ sessions per day.        Copyright  VHI. All rights reserved.

## 2016-11-10 NOTE — Therapy (Signed)
[facility] [address] [place], [place], [address] Phone: [phone]   Fax:  [phone]  Occupational Therapy Treatment  Patient Details  Name: Jesse Ho MRN: [id number] Date of Birth: 01/06/1976 Referring Provider: Dr. Richarda OverlieNayana Abrol  CC. Henrine Screwsobert Thacker  Encounter Date: 11/10/2016      OT End of Session - 11/10/16 0825    Visit Number 2   Number of Visits 8   Date for OT Re-Evaluation 12/02/16   Authorization Type [employer] no visit limits   OT Start Time 0801   OT Stop Time 0821   OT Time Calculation (min) 20 min   Activity Tolerance Patient tolerated treatment well      Past Medical History:  Diagnosis Date  . Cervical dystonia   . CVA (cerebral vascular accident) (HCC)   . Hyperlipidemia   . Hypertension     Past Surgical History:  Procedure Laterality Date  . CYSTECTOMY Left 10/19/2014   left branchial cleft cyst removed  . FRACTURE SURGERY     fx fingers on left hand playing football  . TEE WITHOUT CARDIOVERSION N/A 10/27/2016   Procedure: TRANSESOPHAGEAL ECHOCARDIOGRAM (TEE);  Surgeon: Yates DecampGanji, Jay, MD;  [facility];  Service: Cardiovascular;  Laterality: N/A;    Vitals:   11/10/16 0807  BP: (!) 149/103        Subjective Assessment - 11/10/16 0807    Subjective  My BP is up today- they are changing my meds and I see the doctor again tomorrow   Pertinent History see epic; PT with TIA on5/14/2018;  h/o of cervical dystonia, h/o of L brachial plexus cyst.  HTN!!! CHECK BP!!   Patient Stated Goals Get my left arm stronger   Currently in Pain? Yes   Pain Score 5   manual taken twice   Pain Location Head   Pain Orientation Left;Lateral   Pain Descriptors / Indicators Throbbing   Pain Type Chronic pain   Pain Onset 1 to 4 weeks ago   Pain Frequency Intermittent   Aggravating Factors  certain movements   Pain Relieving Factors resting, OTC meds help some                       OT Treatments/Exercises (OP) - 11/10/16 0001      ADLs   ADL Comments PT arrived today and stated that his BP was elevated this morning. BP in clinic 149/103 (taken manually twice).  Pt reported his MD is changing his meds and he is scheduled to see MD tomorrow again.  Discussed risk factor associated with elevated BP - pt verbalized understanding. Also asked pt to bring a list of meds and dosages next visit so that med record can be updated. Pt verbalized understanding.  Reviewed goals and POC and pt in agreement.  Issued HEP for LUE and terminated session due to elevated BP.      Exercises   Exercises Hand     Hand Exercises   Theraputty - Grip Issued HEP with green putty to address grip strength.  See pt instruction section for details.  Pt able to return demonstrate reps for exercises after instruction and practice                OT Education - 11/10/16 [phone]    Education provided Yes   Education Details HEP for LUE putty, discussion of BP and other risk factors for stroke.   Person(s) Educated Patient   Methods Explanation;Demonstration;Handout  Comprehension Verbalized understanding;Returned demonstration             OT Long Term Goals - 11/10/16 1610      OT LONG TERM GOAL #1   Title Pt will be mod I with HEP - 12/02/2016   Status On-going     OT LONG TERM GOAL #2   Title Pt will demonstrate improved grip strength by at least 5 pounds to assist with functional tasks.   Status On-going     OT LONG TERM GOAL #3   Title Pt will be able to lift 4 pound object off mid to overhead shelf x3 without dropping with LUE   Status On-going               Plan - 11/10/16 9604    Clinical Impression Statement Pt arrived today with elevated BP (149/103 manually);  reviewed goals/POC and issued putty program. Session then terminated due to BP and pt to see MD tomorrow.    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 Check HEP for putty, if possible provide HEP to address LUE proximal strength.    Consulted and Agree with Plan of Care Patient      Patient will benefit from skilled therapeutic intervention in order to improve the following deficits and impairments:  Decreased strength, Impaired UE functional use, Impaired sensation, Pain, Impaired perceived functional ability  Visit Diagnosis: Muscle weakness (generalized)  Other disturbances of skin sensation  Pain in left arm    Problem List Patient Active Problem List   Diagnosis Date Noted  . Left-sided weakness 11/04/2016  . Left facial numbness 10/20/2016  . TIA (transient ischemic attack)   . Cervical dystonia 05/20/2016    Norton Pastel , OTR/L 11/10/2016, 8:27 AM  [facility] [address] [place], [place], [address] Phone: [phone]   Fax:  [phone]  Name: Jesse Ho MRN: [id number] Date of Birth: [birth year]

## 2016-11-11 ENCOUNTER — Ambulatory Visit: Payer: BLUE CROSS/BLUE SHIELD | Admitting: Physical Therapy

## 2016-11-11 ENCOUNTER — Ambulatory Visit: Payer: BLUE CROSS/BLUE SHIELD | Admitting: Occupational Therapy

## 2016-11-11 DIAGNOSIS — I10 Essential (primary) hypertension: Secondary | ICD-10-CM | POA: Diagnosis not present

## 2016-11-13 ENCOUNTER — Encounter (HOSPITAL_COMMUNITY): Payer: Self-pay | Admitting: Emergency Medicine

## 2016-11-13 ENCOUNTER — Emergency Department (HOSPITAL_COMMUNITY)
Admission: EM | Admit: 2016-11-13 | Discharge: 2016-11-13 | Disposition: A | Discharge status: Home / Self Care | Payer: BLUE CROSS/BLUE SHIELD | Attending: Emergency Medicine | Admitting: Emergency Medicine

## 2016-11-13 DIAGNOSIS — R1030 Lower abdominal pain, unspecified: Secondary | ICD-10-CM | POA: Diagnosis not present

## 2016-11-13 DIAGNOSIS — I10 Essential (primary) hypertension: Secondary | ICD-10-CM | POA: Insufficient documentation

## 2016-11-13 DIAGNOSIS — Z79899 Other long term (current) drug therapy: Secondary | ICD-10-CM | POA: Diagnosis not present

## 2016-11-13 DIAGNOSIS — Z7982 Long term (current) use of aspirin: Secondary | ICD-10-CM | POA: Insufficient documentation

## 2016-11-13 DIAGNOSIS — M79662 Pain in left lower leg: Secondary | ICD-10-CM | POA: Diagnosis not present

## 2016-11-13 DIAGNOSIS — R9431 Abnormal electrocardiogram [ECG] [EKG]: Secondary | ICD-10-CM | POA: Diagnosis not present

## 2016-11-13 DIAGNOSIS — R42 Dizziness and giddiness: Secondary | ICD-10-CM | POA: Diagnosis not present

## 2016-11-13 DIAGNOSIS — M79605 Pain in left leg: Secondary | ICD-10-CM

## 2016-11-13 LAB — COMPREHENSIVE METABOLIC PANEL
ALT: 28 U/L (ref 17–63)
ANION GAP: 7 (ref 5–15)
AST: 23 U/L (ref 15–41)
Albumin: 4.2 g/dL (ref 3.5–5.0)
Alkaline Phosphatase: 60 U/L (ref 38–126)
BUN: 9 mg/dL (ref 6–20)
CHLORIDE: 102 mmol/L (ref 101–111)
CO2: 27 mmol/L (ref 22–32)
CREATININE: 1.13 mg/dL (ref 0.61–1.24)
Calcium: 9.1 mg/dL (ref 8.9–10.3)
GFR calc Af Amer: >60 mL/min (ref >60)
Glucose, Bld: 87 mg/dL (ref 65–99)
Potassium: 3.9 mmol/L (ref 3.5–5.1)
SODIUM: 136 mmol/L (ref 135–145)
Total Bilirubin: 1.5 mg/dL — ABNORMAL HIGH (ref 0.3–1.2)
Total Protein: 7.4 g/dL (ref 6.5–8.1)

## 2016-11-13 LAB — DIFFERENTIAL
BASOS PCT: 1 %
Basophils Absolute: 0 10*3/uL (ref 0.0–0.1)
EOS ABS: 0.1 10*3/uL (ref 0.0–0.7)
EOS PCT: 2 %
Lymphocytes Relative: 31 %
Lymphs Abs: 2.1 10*3/uL (ref 0.7–4.0)
MONO ABS: 0.5 10*3/uL (ref 0.1–1.0)
MONOS PCT: 7 %
Neutro Abs: 3.9 10*3/uL (ref 1.7–7.7)
Neutrophils Relative %: 59 %

## 2016-11-13 LAB — I-STAT CHEM 8, ED
BUN: 11 mg/dL (ref 6–20)
CREATININE: 1.2 mg/dL (ref 0.61–1.24)
Calcium, Ion: 1.15 mmol/L (ref 1.15–1.40)
Chloride: 98 mmol/L — ABNORMAL LOW (ref 101–111)
GLUCOSE: 91 mg/dL (ref 65–99)
HEMATOCRIT: 40 % (ref 39.0–52.0)
Hemoglobin: 13.6 g/dL (ref 13.0–17.0)
POTASSIUM: 3.6 mmol/L (ref 3.5–5.1)
Sodium: 139 mmol/L (ref 135–145)
TCO2: 30 mmol/L (ref 0–100)

## 2016-11-13 LAB — CBC
HEMATOCRIT: 40.4 % (ref 39.0–52.0)
Hemoglobin: 13.4 g/dL (ref 13.0–17.0)
MCH: 30.1 pg (ref 26.0–34.0)
MCHC: 33.2 g/dL (ref 30.0–36.0)
MCV: 90.8 fL (ref 78.0–100.0)
PLATELETS: 248 10*3/uL (ref 150–400)
RBC: 4.45 MIL/uL (ref 4.22–5.81)
RDW: 13.6 % (ref 11.5–15.5)
WBC: 6.6 10*3/uL (ref 4.0–10.5)

## 2016-11-13 LAB — PROTIME-INR
INR: 1.02
PROTHROMBIN TIME: 13.4 s (ref 11.4–15.2)

## 2016-11-13 LAB — I-STAT TROPONIN, ED: TROPONIN I, POC: 0 ng/mL (ref 0.00–0.08)

## 2016-11-13 LAB — CBG MONITORING, ED: GLUCOSE-CAPILLARY: 72 mg/dL (ref 65–99)

## 2016-11-13 LAB — APTT: aPTT: 33 seconds (ref 24–36)

## 2016-11-13 NOTE — ED Notes (Signed)
Pt verbalized understanding discharge instructions and denies any further needs or questions at this time. VS stable, ambulatory and steady gait.   

## 2016-11-13 NOTE — ED Notes (Signed)
Dr. Rosalia Hammersay in triage, assessing patient at this time.

## 2016-11-13 NOTE — ED Triage Notes (Addendum)
Pt to ED from home c/o stroke symptoms, initially starting at 1pm today. Patient states he was driving when he felt sudden headache and dizziness come on. Pt then went home and while sitting down at 3pm, felt severe L upper leg pain that shot down to L knee and back. He states he had a stroke 2 weeks ago with L sided weakness as a deficit, but was still ambulatory. MRI was normal last month and pt thought to have had a TIA. No focal symptoms in triage, pt unable to move L leg d/t pain in leg. Reports dizziness had improved. A&O x 4, grip strength strong and equal. Pt denies injury.

## 2016-11-13 NOTE — Discharge Instructions (Signed)
Please check with your primary care doctor for reevaluation of her blood pressure is weak. Use Tylenol or ibuprofen for groin pain. Return here if any change in her symptoms are worse anytime.

## 2016-11-13 NOTE — ED Provider Notes (Signed)
[facility] DEPT Provider Note   CSN: [id number] Arrival date & time: 11/13/16  1707     History   Chief Complaint Chief Complaint  Patient presents with  . Stroke Symptoms    HPI Jesse Ho is a 41 y.o. male.  HPI This is a 41 year old man who presents today complaining of headedness and left groin discomfort. He has recently been seen and evaluated for left arm numbness and weakness. This resolved and his MRI and MRA were negative. It was felt to possibly be a TIA. He also had a cardiac evaluation after that. Today after becoming lightheaded he had some pain in his left thigh and felt this could be a blood clot. Secondary to that he presents to the ED for evaluation. He specifically states that he felt that the dizziness he had was light headedness like he was possibly going to pass out. He denies any vertigo or ataxia. He has not had any head injury and does not have a headache. He denies any numbness tingling or weakness. He was had some difficulty moving his left side due to pain. On my evaluation at triage he removed it without difficulty. Past Medical History:  Diagnosis Date  . Cervical dystonia   . CVA (cerebral vascular accident) (HCC)   . Hyperlipidemia   . Hypertension     Patient Active Problem List   Diagnosis Date Noted  . Left-sided weakness 11/04/2016  . Left facial numbness 10/20/2016  . TIA (transient ischemic attack)   . Cervical dystonia 05/20/2016    Past Surgical History:  Procedure Laterality Date  . CYSTECTOMY Left 10/19/2014   left branchial cleft cyst removed  . FRACTURE SURGERY     fx fingers on left hand playing football  . TEE WITHOUT CARDIOVERSION N/A 10/27/2016   Procedure: TRANSESOPHAGEAL ECHOCARDIOGRAM (TEE);  Surgeon: Yates Decamp, MD;  Location: [facility] ENDOSCOPY;  Service: Cardiovascular;  Laterality: N/A;       Home Medications    Prior to Admission medications   Medication Sig Start Date End Date Taking? Authorizing Provider    aspirin EC 81 MG tablet Take 1 tablet (81 mg total) by mouth daily. 10/20/16  Yes Richarda Overlie, MD  atorvastatin (LIPITOR) 10 MG tablet Take 10 mg by mouth at bedtime.  10/22/16  Yes [provider]  Cholecalciferol (VITAMIN D-3) 5000 units TABS Take 5,000 Units by mouth daily.   Yes [provider]  ibuprofen (ADVIL,MOTRIN) 200 MG tablet Take 200 mg by mouth every 6 (six) hours as needed for headache (pain).   Yes [provider]  lisinopril-hydrochlorothiazide (PRINZIDE,ZESTORETIC) 10-12.5 MG tablet Take 2 tablets by mouth daily.  11/04/16  Yes [provider]  triamterene-hydrochlorothiazide (DYAZIDE) 37.5-25 MG capsule Take 1 each (1 capsule total) by mouth daily. Patient not taking: Reported on 11/13/2016 10/20/16 10/20/17  Richarda Overlie, MD    Family History Family History  Problem Relation Age of Onset  . Hyperlipidemia Other   . Hypertension Other   . Diabetes Other   . Obesity Other   . Diabetes Sister     Social History Social History  Substance Use Topics  . Smoking status: Never Smoker  . Smokeless tobacco: Never Used  . Alcohol use Yes     Allergies   Keflex [cephalexin]   Review of Systems Review of Systems  All other systems reviewed and are negative.    Physical Exam Updated Vital Signs BP 135/78   Pulse (!) 56   Temp 97.5 F (36.4  C) (Oral)   Resp (!) 21   Ht 1.676 m (5\' 6" )   Wt 84.4 kg (186 lb)   SpO2 98%   BMI 30.02 kg/m   Physical Exam  Constitutional: He is oriented to person, place, and time. He appears well-developed and well-nourished.  HENT:  Head: Normocephalic and atraumatic.  Right Ear: External ear normal.  Left Ear: External ear normal.  Nose: Nose normal.  Mouth/Throat: Oropharynx is clear and moist.  Eyes: Conjunctivae and EOM are normal. Pupils are equal, round, and reactive to light.  Neck: Normal range of motion. Neck supple.  Cardiovascular: Normal rate, regular rhythm, normal heart  sounds and intact distal pulses.   Pulmonary/Chest: Effort normal and breath sounds normal.  Abdominal: Soft. Bowel sounds are normal.  Musculoskeletal: Normal range of motion.  Left lower extremity with no signs of trauma mild tenderness in the groin. Pulses are normal. There is no swelling, redness, or foot swelling.  Neurological: He is alert and oriented to person, place, and time. He has normal reflexes. He displays normal reflexes. No cranial nerve deficit or sensory deficit. He exhibits normal muscle tone. Coordination normal.  Skin: Skin is warm and dry.  Psychiatric: He has a normal mood and affect. His behavior is normal. Judgment and thought content normal.  Nursing note and vitals reviewed.    ED Treatments / Results  Labs (all labs ordered are listed, but only abnormal results are displayed) Labs Reviewed  COMPREHENSIVE METABOLIC PANEL - Abnormal; Notable for the following:       Result Value   Total Bilirubin 1.5 (*)    All other components within normal limits  I-STAT CHEM 8, ED - Abnormal; Notable for the following:    Chloride 98 (*)    All other components within normal limits  PROTIME-INR  APTT  CBC  DIFFERENTIAL  I-STAT TROPOININ, ED  CBG MONITORING, ED    EKG  EKG Interpretation  Date/Time:  Friday November 13 2016 17:13:45 EDT Ventricular Rate:  64 PR Interval:  186 QRS Duration: 94 QT Interval:  390 QTC Calculation: 402 R Axis:   14 Text Interpretation:  Normal sinus rhythm with sinus arrhythmia Left ventricular hypertrophy Abnormal ECG Confirmed by Shatika Grinnell MD, Duwayne HeckANIELLE ([id number]) on 11/13/2016 7:13:22 PM       Radiology No results found.  Procedures Procedures (including critical care time)  Medications Ordered in ED Medications - No data to display   Initial Impression / Assessment and Plan / ED Course  I have reviewed the triage vital signs and the nursing notes.  Pertinent labs & imaging results that were available during my care of the patient  were reviewed by me and considered in my medical decision making (see chart for details).     Patient here with some lightheadedness which may be secondary to recent doubling of his antihypertensives. We have discussed that he should follow-up with his primary care for reevaluation. He will stay well hydrated over the weekend. Left thigh and groin pain without any evidence of trauma. I see no evidence of DVT on his exam. He has no symptoms consistent with stroke. We have discussed all the above symptoms, need for follow-up, and return precautions and he and his wife voice understanding.  Final Clinical Impressions(s) / ED Diagnoses   Final diagnoses:  Light headed  Pain of left lower extremity    New Prescriptions New Prescriptions   No medications on file     Margarita Grizzleay, Ramaya Guile, MD 11/13/16 [id number]

## 2016-11-13 NOTE — ED Triage Notes (Signed)
Dr. Rosalia Hammersay notified of patient's symptoms - to see in triage.

## 2016-11-16 ENCOUNTER — Ambulatory Visit: Payer: BLUE CROSS/BLUE SHIELD | Admitting: Occupational Therapy

## 2016-11-16 ENCOUNTER — Ambulatory Visit: Payer: BLUE CROSS/BLUE SHIELD | Admitting: Physical Therapy

## 2016-11-16 DIAGNOSIS — R002 Palpitations: Secondary | ICD-10-CM | POA: Diagnosis not present

## 2016-11-16 DIAGNOSIS — R531 Weakness: Secondary | ICD-10-CM | POA: Diagnosis not present

## 2016-11-16 DIAGNOSIS — E785 Hyperlipidemia, unspecified: Secondary | ICD-10-CM | POA: Diagnosis not present

## 2016-11-16 DIAGNOSIS — Z8673 Personal history of transient ischemic attack (TIA), and cerebral infarction without residual deficits: Secondary | ICD-10-CM | POA: Diagnosis not present

## 2016-11-17 ENCOUNTER — Ambulatory Visit: Payer: BLUE CROSS/BLUE SHIELD | Admitting: Occupational Therapy

## 2016-11-17 ENCOUNTER — Telehealth: Payer: Self-pay | Admitting: Occupational Therapy

## 2016-11-17 NOTE — Telephone Encounter (Signed)
Pt has missed last two OT appts and last PT appt and did not call to cancel appts. Left message asking pt to please contact us about his appts.  Asked pt to please call and clarify regarding remaining appts.

## 2016-11-18 ENCOUNTER — Other Ambulatory Visit: Payer: BLUE CROSS/BLUE SHIELD

## 2016-11-20 DIAGNOSIS — R002 Palpitations: Secondary | ICD-10-CM | POA: Diagnosis not present

## 2016-11-23 ENCOUNTER — Encounter: Payer: Self-pay | Admitting: Occupational Therapy

## 2016-11-23 ENCOUNTER — Encounter: Payer: Self-pay | Admitting: Physical Therapy

## 2016-11-23 ENCOUNTER — Ambulatory Visit: Payer: BLUE CROSS/BLUE SHIELD | Admitting: Occupational Therapy

## 2016-11-23 ENCOUNTER — Ambulatory Visit: Payer: BLUE CROSS/BLUE SHIELD | Admitting: Physical Therapy

## 2016-11-23 VITALS — BP 138/98

## 2016-11-23 VITALS — BP 154/104

## 2016-11-23 DIAGNOSIS — R2681 Unsteadiness on feet: Secondary | ICD-10-CM

## 2016-11-23 DIAGNOSIS — M6281 Muscle weakness (generalized): Secondary | ICD-10-CM

## 2016-11-23 DIAGNOSIS — R208 Other disturbances of skin sensation: Secondary | ICD-10-CM

## 2016-11-23 DIAGNOSIS — M79602 Pain in left arm: Secondary | ICD-10-CM | POA: Diagnosis not present

## 2016-11-23 DIAGNOSIS — R2689 Other abnormalities of gait and mobility: Secondary | ICD-10-CM

## 2016-11-23 NOTE — Patient Instructions (Addendum)
  1. Strengthening: Resisted Flexion   Hold tubing with _____ arm(s) at side. Pull forward and up. Move shoulder through pain-free range of motion. Repeat __15__ times per set.  Do _1-2_ sessions per day   2.  Strengthening: Resisted Extension   Hold tubing in _____ hand(s), arm forward. Pull arm back, elbow straight. Repeat _15___ times per set. Do _1-2___ sessions per day   3.  Resisted Horizontal Abduction: Bilateral   Sit or stand, tubing in both hands, arms out in front. Keeping arms straight, pinch shoulder blades together and stretch arms out. Repeat _15___ times per set. Do _1-2___ sessions per day   4.  Elbow Flexion: Resisted   With tubing held in ______ hand(s) and other end secured under foot, curl arm up as far as possible. Repeat _10___ times per set. Do _1-2___ sessions per day   5.  Shoulder Abduction:  Sit on a firm surface.  Hold theraband in your left hand with palm facing forward. Step on other end of the theraband to secure it. Keeping your elbow straight, bring arm out to the side like you are doing a jumping jack then slowly lower it down. Control the band both up and down.  Do 15, rest then do 15 more.   Do 1-2 sessions per day.        Copyright  VHI. All rights reserved.

## 2016-11-23 NOTE — Therapy (Signed)
[facility] [address] [place], [place], [address] Phone: [phone]   Fax:  [phone]  Physical Therapy Treatment  Patient Details  Name: Jesse Ho MRN: [id number] Date of Birth: 12-Jun-1975 Referring Provider: Richarda Overlie, MD  Encounter Date: 11/23/2016      PT End of Session - 11/23/16 0845    Visit Number 3   Number of Visits 9   Date for PT Re-Evaluation 11/26/16   Authorization Type BCBS   PT Start Time 0845   PT Stop Time 0930   PT Time Calculation (min) 45 min   Equipment Utilized During Treatment Gait belt   Activity Tolerance Treatment limited secondary to medical complications (Comment)      Past Medical History:  Diagnosis Date  . Cervical dystonia   . CVA (cerebral vascular accident) (HCC)   . Hyperlipidemia   . Hypertension     Past Surgical History:  Procedure Laterality Date  . CYSTECTOMY Left 10/19/2014   left branchial cleft cyst removed  . FRACTURE SURGERY     fx fingers on left hand playing football  . TEE WITHOUT CARDIOVERSION N/A 10/27/2016   Procedure: TRANSESOPHAGEAL ECHOCARDIOGRAM (TEE);  Surgeon: Yates Decamp, MD;  Location: [facility] ENDOSCOPY;  Service: Cardiovascular;  Laterality: N/A;    Vitals:   11/23/16 0845 11/23/16 0859 11/23/16 0905 11/23/16 0913  BP: (!) 140/92- before session (!) 155/99- after 3 ex's (!) 168/104- after remaining 3 ex's (!) 154/104- after seated rest         Subjective Assessment - 11/23/16 0845    Subjective No new complaints. No falls or pain to report.   Pertinent History wearing heart monitor x 10 days; Lt cervical brachial cyst removed 10/2014:  cervical dystonia Dec. 2017   Patient Stated Goals improve balance - be able to return to work - improve walking   Currently in Pain? No/denies   Pain Score 0-No pain     issued the following to HEP today:  Functional Quadriceps: Sit to Stand    Sit on edge of chair, feet flat on floor. Stand  upright, extending knees fully. Repeat _10_ times per set. Do _1_ sets per session. Do _1-2_ sessions per day.  http://orth.exer.us/734   Copyright  VHI. All rights reserved.   Perform these at the counter top for balance assistance as needed:   "I love a Parade" Lift   At counter for balance as needed: high knee marching forward and then backward. 3 second pauses with each knee lift.  Repeat 3 laps each way. Do _1-2_ sessions per day. http://gt2.exer.us/345   Copyright  VHI. All rights reserved.  Side-Stepping   At counter for balance as needed: with red band around your ankles. Walk to left side with eyes open. Take even steps, leading with same foot. Make sure each foot lifts off the floor. Repeat in opposite direction. Keep feet pointed toward counter. Repeat for 1-3 laps each way.  Do _1-2__ sessions per day.  Copyright  VHI. All rights reserved.  Walking on Heels   At counter: Walk on heels forward while continuing on a straight path, and then walk on heels backward to starting position. Repeat for 3 laps each way. Do _1-2__ sessions per day.  Copyright  VHI. All rights reserved.  Walking on Toes   At counter for balance as needed: Walk on toes forward while continuing on a straight path, and then backwards on toes to starting position. Repeat 3 laps each way. Do  _1-2___ sessions per day.  Copyright  VHI. All rights reserved.  Feet Heel-Toe "Tandem"   At counter: Arms at sides, walk a straight line forward bringing one foot directly in front of the other, and then a straight line backwards bringing one foot directly behind the other one.  Repeat for _3 laps each way. Do _1-2_ sessions per day.  Copyright  VHI. All rights reserved.            PT Education - 11/23/16 [phone]    Education provided Yes   Education Details HEP for LE strengthening and balance   Person(s) Educated Patient   Methods Explanation;Demonstration;Verbal cues;Handout    Comprehension Verbalized understanding;Returned demonstration;Verbal cues required;Need further instruction             PT Long Term Goals - 10/27/16 2103      PT LONG TERM GOAL #1   Title Pt will report at least 50% improvement in LLE strength and functional use.  11-26-16   Time 4   Period Weeks   Status New     PT LONG TERM GOAL #2   Title Pt perform SLS on LLE at least 4 secs for improved high level balance skills.   11-26-16   Baseline pt unable to stand on LLE without UE support   Time 4   Period Weeks   Status New     PT LONG TERM GOAL #3   Title Improve gait velocity to >/= 3.2 ft/sec without device for incr. gait efficiency.  11-26-16   Time 4   Period Weeks   Status New     PT LONG TERM GOAL #4   Title Negotiate 4 steps without hand rail using a step over step sequence to demo improved LE strength and balance.  11-26-16   Time 4   Period Weeks   Status New     PT LONG TERM GOAL #5   Title Independent in HEP for LLE strengthening and high level balance skills.  11-26-16   Time 4   Period Weeks   Status New               Plan - 11/23/16 0845    Clinical Impression Statement Today's skilled session focused on establishment of an HEP for strengthening and balance. Pt continues to have elevated BP's with activity. MD is aware and is monitoring/adjusting meds as needed (most recent med change was last Thursday). Pt is progressing however and should benefit from continued PT to progress toward unmet goals.    Rehab Potential Good   PT Frequency 2x / week   PT Duration 4 weeks   PT Treatment/Interventions ADLs/Self Care Home Management;Functional mobility training;Stair training;Therapeutic activities;Therapeutic exercise;Balance training;Neuromuscular re-education;Patient/family education;Passive range of motion;Gait training   PT Next Visit Plan continue to work on LLE strengthening and balance- monitor BP!   PT Home Exercise Plan stretches given on 10-26-16    Consulted and Agree with Plan of Care Patient      Patient will benefit from skilled therapeutic intervention in order to improve the following deficits and impairments:  Abnormal gait, Decreased balance, Decreased endurance, Impaired tone, Increased muscle spasms, Decreased strength, Impaired sensation  Visit Diagnosis: Muscle weakness (generalized)  Other abnormalities of gait and mobility  Unsteadiness on feet     Problem List Patient Active Problem List   Diagnosis Date Noted  . Left-sided weakness 11/04/2016  . Left facial numbness 10/20/2016  . TIA (transient ischemic attack)   . Cervical dystonia 05/20/2016  Sallyanne KusterKathy Shariya Gaster, PTA, [facility] [address], [address] [place], [place] [address] [phone] 11/23/16, 12:15 PM   Name: Jesse Ho MRN: [id number] Date of Birth: 03/14/1976

## 2016-11-23 NOTE — Therapy (Signed)
[facility] [address] [place], [place], [address] Phone: [phone]   Fax:  [phone]  Occupational Therapy Treatment  Patient Details  Name: Jesse PuntDavid G Ho MRN: [id number] Date of Birth: 10/29/1975 Referring Provider: Dr. Richarda OverlieNayana Abrol  CC. Henrine Screwsobert Thacker  Encounter Date: 11/23/2016      OT End of Session - 11/23/16 1251    Visit Number 3   Number of Visits 8   Date for OT Re-Evaluation 12/02/16   Authorization Type BCBS no visit limits   OT Start Time 0804   OT Stop Time 0844   OT Time Calculation (min) 40 min   Activity Tolerance Patient tolerated treatment well      Past Medical History:  Diagnosis Date  . Cervical dystonia   . CVA (cerebral vascular accident) (HCC)   . Hyperlipidemia   . Hypertension     Past Surgical History:  Procedure Laterality Date  . CYSTECTOMY Left 10/19/2014   left branchial cleft cyst removed  . FRACTURE SURGERY     fx fingers on left hand playing football  . TEE WITHOUT CARDIOVERSION N/A 10/27/2016   Procedure: TRANSESOPHAGEAL ECHOCARDIOGRAM (TEE);  Surgeon: Yates DecampGanji, Jay, MD;  [facility];  Service: Cardiovascular;  Laterality: N/A;    Vitals:   11/23/16 0808  BP: (!) 138/98        Subjective Assessment - 11/23/16 0813    Subjective  They are still working with me on my BP meds   Pertinent History CHECK BP!!! Pt is having issues with regulating this.  see epic; PT with TIA on5/14/2018;  h/o of cervical dystonia, h/o of L brachial plexus cyst.  HTN!!! CHECK BP!!   Patient Stated Goals Get my left arm stronger   Currently in Pain? No/denies                      OT Treatments/Exercises (OP) - 11/23/16 0001      Exercises   Exercises Shoulder     Shoulder Exercises: Seated   Other Seated Exercises Pt issued HEP using red theraband - see pt instruction section for details.  Pt able to return demonstrate all exercises and reps/sets after  instruction.  Pt with elevated diastolic BP today (see vitals) however exercises did not elevate BP any further.                  OT Education - 11/23/16 [id number]    Education provided Yes   Education Details HEP for proximal LUE strength theraband with Red band.    Person(s) Educated Patient   Methods Explanation;Demonstration;Handout;Verbal cues   Comprehension Verbalized understanding;Returned demonstration             OT Long Term Goals - 11/23/16 1248      OT LONG TERM GOAL #1   Title [name] be mod I with HEP - 12/02/2016   Status Achieved     OT LONG TERM GOAL #2   Title [name] demonstrate improved grip strength by at least 5 pounds to assist with functional tasks.   Status Achieved  65 pounds     OT LONG TERM GOAL #3   Title [name] be able to lift 4 pound object off mid to overhead shelf x3 without dropping with LUE   Status On-going               Plan - 11/23/16 1248    Clinical Impression Statement Pt remains with elevated BP (  138/98) however able to work in therapy today. 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 check HEP for theraband,  Address remaining goal   Consulted and Agree with Plan of Care Patient      Patient Jesse benefit from skilled therapeutic intervention in order to improve the following deficits and impairments:  Decreased strength, Impaired UE functional use, Impaired sensation, Pain, Impaired perceived functional ability  Visit Diagnosis: Muscle weakness (generalized)  Other disturbances of skin sensation  Pain in left arm    Problem  List Patient Active Problem List   Diagnosis Date Noted  . Left-sided weakness 11/04/2016  . Left facial numbness 10/20/2016  . TIA (transient ischemic attack)   . Cervical dystonia 05/20/2016    Norton Pastel, OTR/L 11/23/2016, 12:53 PM  [facility] [address] [place], [place], [address] Phone: [phone]   Fax:  [phone]  Name: Jesse Ho MRN: [id number] Date of Birth: 12-Nov-1975

## 2016-11-23 NOTE — Patient Instructions (Addendum)
Functional Quadriceps: Sit to Stand    Sit on edge of chair, feet flat on floor. Stand upright, extending knees fully. Repeat _10_ times per set. Do _1_ sets per session. Do _1-2_ sessions per day.  http://orth.exer.us/734   Copyright  VHI. All rights reserved.   Perform these at the counter top for balance assistance as needed:   "I love a Parade" Lift   At counter for balance as needed: high knee marching forward and then backward. 3 second pauses with each knee lift.  Repeat 3 laps each way. Do _1-2_ sessions per day. http://gt2.exer.us/345   Copyright  VHI. All rights reserved.  Side-Stepping   At counter for balance as needed: with red band around your ankles. Walk to left side with eyes open. Take even steps, leading with same foot. Make sure each foot lifts off the floor. Repeat in opposite direction. Keep feet pointed toward counter. Repeat for 1-3 laps each way.  Do _1-2__ sessions per day.  Copyright  VHI. All rights reserved.  Walking on Heels   At counter: Walk on heels forward while continuing on a straight path, and then walk on heels backward to starting position. Repeat for 3 laps each way. Do _1-2__ sessions per day.  Copyright  VHI. All rights reserved.  Walking on Toes   At counter for balance as needed: Walk on toes forward while continuing on a straight path, and then backwards on toes to starting position. Repeat 3 laps each way. Do _1-2___ sessions per day.  Copyright  VHI. All rights reserved.  Feet Heel-Toe "Tandem"   At counter: Arms at sides, walk a straight line forward bringing one foot directly in front of the other, and then a straight line backwards bringing one foot directly behind the other one.  Repeat for _3 laps each way. Do _1-2_ sessions per day.  Copyright  VHI. All rights reserved.

## 2016-11-24 ENCOUNTER — Encounter: Payer: BLUE CROSS/BLUE SHIELD | Admitting: Occupational Therapy

## 2016-11-24 ENCOUNTER — Ambulatory Visit: Payer: BLUE CROSS/BLUE SHIELD | Admitting: Physical Therapy

## 2016-11-24 DIAGNOSIS — I10 Essential (primary) hypertension: Secondary | ICD-10-CM | POA: Diagnosis not present

## 2016-11-30 ENCOUNTER — Ambulatory Visit: Payer: BLUE CROSS/BLUE SHIELD | Admitting: Occupational Therapy

## 2016-11-30 ENCOUNTER — Ambulatory Visit: Payer: BLUE CROSS/BLUE SHIELD | Admitting: Physical Therapy

## 2016-11-30 ENCOUNTER — Encounter: Payer: Self-pay | Admitting: Occupational Therapy

## 2016-11-30 VITALS — BP 144/92

## 2016-11-30 DIAGNOSIS — R2689 Other abnormalities of gait and mobility: Secondary | ICD-10-CM | POA: Diagnosis not present

## 2016-11-30 DIAGNOSIS — M79602 Pain in left arm: Secondary | ICD-10-CM | POA: Diagnosis not present

## 2016-11-30 DIAGNOSIS — M6281 Muscle weakness (generalized): Secondary | ICD-10-CM

## 2016-11-30 DIAGNOSIS — R2681 Unsteadiness on feet: Secondary | ICD-10-CM | POA: Diagnosis not present

## 2016-11-30 DIAGNOSIS — R208 Other disturbances of skin sensation: Secondary | ICD-10-CM | POA: Diagnosis not present

## 2016-11-30 NOTE — Patient Instructions (Signed)
Iliotibial Band Stretch    Stand with left  hip __8-10__ inches from wall. Cross other leg in front and use it and arm for support. Lean toward wall until a stretch is felt on outside of hip near wall. Keep that leg straight. Hold __30__ seconds. Repeat ___2_ times. Do __3__ sessions per day.  http://gt2.exer.us/354   Copyright  VHI. All rights reserved.

## 2016-11-30 NOTE — Therapy (Signed)
[facility] [address] [place], [place], [address] Phone: [phone]   Fax:  [phone]  Occupational Therapy Treatment  Patient Details  Name: Jesse Ho MRN: [id number] Date of Birth: 1975/09/27 Referring Provider: Dr. Reyne Dumas  CC. Aura Dials  Encounter Date: 11/30/2016    Past Medical History:  Diagnosis Date  . Cervical dystonia   . CVA (cerebral vascular accident) ([place])   . Hyperlipidemia   . Hypertension     Past Surgical History:  Procedure Laterality Date  . CYSTECTOMY Left 10/19/2014   left branchial cleft cyst removed  . FRACTURE SURGERY     fx fingers on left hand playing football  . TEE WITHOUT CARDIOVERSION N/A 10/27/2016   Procedure: TRANSESOPHAGEAL ECHOCARDIOGRAM (TEE);  Surgeon: Adrian Prows, MD;  Location: [place];  Service: Cardiovascular;  Laterality: N/A;    There were no vitals filed for this visit.     Pt did not show for appt again today. See note below. Contacted pt via telephone - see note below for details.                            OT Long Term Goals - 11/30/16 1006      OT LONG TERM GOAL #1   Title Pt will be mod I with HEP - 12/02/2016   Status Achieved     OT LONG TERM GOAL #2   Title Pt will demonstrate improved grip strength by at least 5 pounds to assist with functional tasks.   Status Achieved  65 pounds     OT LONG TERM GOAL #3   Title Pt will be able to lift 4 pound object off mid to overhead shelf x3 without dropping with LUE   Status Unable to assess               Plan - 11/30/16 1006    Clinical Impression Statement Pt has missed several OT and PT appts and did not show again today. Called pt to discuss POC at this point. PT has HEP and has met 2/3 goals and has the ability to work toward 3 goal at home on his home.  Recommended D/C from therapy at this time and pt in agreement.  Pt is aware that he would need  to obtain new MD order for eval and tx in order to return to therapy.    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, decreased use of LUE since 2016, perceived impairement of LUE   OT Frequency 2x / week   OT Duration 4 weeks   OT Treatment/Interventions Self-care/ADL training;Therapeutic exercise;Neuromuscular education;DME and/or AE instruction;Therapeutic activities;Patient/family education   Plan d/c from OT   Consulted and Agree with Plan of Care Patient      Patient will benefit from skilled therapeutic intervention in order to improve the following deficits and impairments:  Decreased strength, Impaired UE functional use, Impaired sensation, Pain, Impaired perceived functional ability  Visit Diagnosis: Muscle weakness (generalized)    Problem List Patient Active Problem List   Diagnosis Date Noted  . Left-sided weakness 11/04/2016  . Left facial numbness 10/20/2016  . TIA (transient ischemic attack)   . Cervical dystonia 05/20/2016   OCCUPATIONAL THERAPY DISCHARGE SUMMARY  Visits from Start of Care: 3  Current functional level related to goals / functional outcomes: See above unable to assess final goal as pt has not returned for  therapy   Remaining deficits: Mild RUE weakness   Education / Equipment: HEP  Plan: Patient agrees to discharge.  Patient goals were partially met. Patient is being discharged due to not returning since the last visit.  ?????      Quay Burow, OTR/L 11/30/2016, 10:08 AM  [facility] [address] [place], [place], [address] Phone: [phone]   Fax:  [phone]  Name: AUTREY HUMAN MRN: [id number] Date of Birth: November 10, 1975

## 2016-11-30 NOTE — Therapy (Signed)
[facility] [address] [place], [place], [address] Phone: [phone]   Fax:  [phone]  Physical Therapy Treatment  Patient Details  Name: Jesse Ho MRN: [id number] Date of Birth: March 06, 1976 Referring Provider: Richarda Overlie, MD  Encounter Date: 11/30/2016      PT End of Session - 11/30/16 1327    Visit Number 4   Number of Visits 9   Date for PT Re-Evaluation 11/26/16   Authorization Type BCBS   PT Start Time 1020   PT Stop Time 1101   PT Time Calculation (min) 41 min      Past Medical History:  Diagnosis Date  . Cervical dystonia   . CVA (cerebral vascular accident) (HCC)   . Hyperlipidemia   . Hypertension     Past Surgical History:  Procedure Laterality Date  . CYSTECTOMY Left 10/19/2014   left branchial cleft cyst removed  . FRACTURE SURGERY     fx fingers on left hand playing football  . TEE WITHOUT CARDIOVERSION N/A 10/27/2016   Procedure: TRANSESOPHAGEAL ECHOCARDIOGRAM (TEE);  Surgeon: Yates Decamp, MD;  Location: [facility] ENDOSCOPY;  Service: Cardiovascular;  Laterality: N/A;    Vitals:   11/30/16 1116 11/30/16 1130 11/30/16 1131 11/30/16 1132  BP: (!) 161/11 (!) 152/102 (!) 146/98 (!) 144/92  above reading 161/111 recorded with automatic cuff; the other 3 readings recorded manually      Subjective Assessment - 11/30/16 1116    Subjective Pt states he continues to have high blood pressure - went back to MD last Thurs. and says they continue to change medication   Pertinent History wearing heart monitor x 10 days; Lt cervical brachial cyst removed 10/2014:  cervical dystonia Dec. 2017   Patient Stated Goals improve balance - be able to return to work - improve walking   Currently in Pain? Yes   Pain Location Leg   Pain Orientation Left   Pain Descriptors / Indicators Cramping;Tender;Grimacing;Sore   Pain Type Acute pain   Pain Onset 1 to 4 weeks ago   Pain Frequency Intermittent   Aggravating Factors  deep palpation of Lt IT band increases pain significantly   Pain Relieving Factors rest, stretches help                         [facility] Adult PT Treatment/Exercise - 11/30/16 1133      Exercises   Exercises Ankle;Knee/Hip     Knee/Hip Exercises: Stretches   Gastroc Stretch Left;1 rep;30 seconds  using bottom shelf of cabinet   Other Knee/Hip Stretches Lt iliotibial band stretch in Rt sidelying 30 sec hold; pt also performed this stretch in standing - 20 sec hold      Knee/Hip Exercises: Standing   Heel Raises Both;1 set;10 reps;2 seconds  instructed pt to place more weight on LLE than on RLE   Hip Flexion Left;Stengthening;1 set;10 reps  with green theraband in standing   Hip Abduction Stengthening;Left;1 set;10 reps  with green theraband   Hip Extension Left;Stengthening;1 set;10 reps  with green theraband   Other Standing Knee Exercises standing Lt hip and knee flexion with green theraband x 10 reps     Ankle Exercises: Standing   Heel Raises 10 reps;2 seconds  bil. LE's     Instructed in Lt iliotibial band stretches for home - in right sidelying and in standing position; handout not given due to time constraint - Pt reported he could remember stretch - will  plan to give picture of this stretch next session           PT Education - 11/30/16 1326    Education provided Yes   Education Details added IT band stretch in Rt sidelying and in standing position   Person(s) Educated Patient   Methods Explanation;Demonstration  handout to be given next session - not given due to time constraint   Comprehension Verbalized understanding;Returned demonstration             PT Long Term Goals - 11/30/16 1336      PT LONG TERM GOAL #1   Title Pt will report at least 50% improvement in LLE strength and functional use.  11-26-16/   12-30-16 NEW TARGET DATE  EXTEND ALL LTG's til 12-30-16 due to inconsistent attendance due to HTN    Status  On-going     PT LONG TERM GOAL #2   Title Pt perform SLS on LLE at least 4 secs for improved high level balance skills.   11-26-16/ NEW TARGET DATE 12-30-16  EXTEND til 12-29-16 due to inconsistent attendance   Status On-going     PT LONG TERM GOAL #3   Title Improve gait velocity to >/= 3.2 ft/sec without device for incr. gait efficiency.  11-26-16/ NEW TARGET DATE 12-30-16   Time 4   Period Weeks   Status On-going     PT LONG TERM GOAL #4   Title Negotiate 4 steps without hand rail using a step over step sequence to demo improved LE strength and balance.  11-26-16/ NEW TARGET DATE 12-30-16   Status On-going     PT LONG TERM GOAL #5   Title Independent in HEP for LLE strengthening and high level balance skills.  11-26-16/ 12-30-16 NEW TARGET DATE   Time 4   Period Weeks   Status On-going     Additional Long Term Goals   Additional Long Term Goals Yes     PT LONG TERM GOAL #6   Title Pt will report at least 50% improvement in LLE pain after treatment interventions for Lt iliotibial band tightness.  12-30-16   Baseline pt reports pain in LLE in Lt IT band area a 10/10 with deep palpation - 11-30-16   Time 4   Period Weeks   Status New               Plan - 11/30/16 1328    Clinical Impression Statement Pt appears to have iliotibial and tightness with c/o moderate to severe discomfort with stretching and with deep pressure applied over IT band.  Pt continues to have high BP readings which fluctuates during session.  Pt has significant weakness in Lt plantarflexors.     Rehab Potential Good   PT Frequency 2x / week   PT Duration 4 weeks   PT Treatment/Interventions ADLs/Self Care Home Management;Functional mobility training;Stair training;Therapeutic activities;Therapeutic exercise;Balance training;Neuromuscular re-education;Patient/family education;Passive range of motion;Gait training;Ultrasound;Cryotherapy;Moist Heat   PT Next Visit Plan renewal completed on 11-30-16 - will add  ultrasound to POC to address Lt IT band tightness; give picture of iliotibial band stretch for HEP, ultrasound, manual therapy with deep soft tissue mobilization to Lt IT band area; cont IT band stretches - cont to monitor BP during session   PT Home Exercise Plan stretches given on 10-26-16   Consulted and Agree with Plan of Care Patient      Patient will benefit from skilled therapeutic intervention in order to improve the following deficits and impairments:  Abnormal gait, Decreased balance, Decreased endurance, Impaired tone, Increased muscle spasms, Decreased strength, Impaired sensation  Visit Diagnosis: Muscle weakness (generalized) - Plan: PT plan of care cert/re-cert     Problem List Patient Active Problem List   Diagnosis Date Noted  . Left-sided weakness 11/04/2016  . Left facial numbness 10/20/2016  . TIA (transient ischemic attack)   . Cervical dystonia 05/20/2016    Kary KosDilday, Lakeyshia Tuckerman Suzanne, PT 11/30/2016, 4:54 PM  [facility] [address] [place], [place], [address] Phone: [phone]   Fax:  [phone]  Name: Tamera PuntDavid G Carrier MRN: [id number] Date of Birth: 05/03/1976

## 2016-12-02 ENCOUNTER — Emergency Department (HOSPITAL_COMMUNITY): Payer: BLUE CROSS/BLUE SHIELD

## 2016-12-02 ENCOUNTER — Encounter (HOSPITAL_COMMUNITY): Payer: Self-pay | Admitting: *Deleted

## 2016-12-02 ENCOUNTER — Emergency Department (HOSPITAL_COMMUNITY)
Admission: EM | Admit: 2016-12-02 | Discharge: 2016-12-02 | Disposition: A | Discharge status: Home / Self Care | Payer: BLUE CROSS/BLUE SHIELD | Attending: Emergency Medicine | Admitting: Emergency Medicine

## 2016-12-02 DIAGNOSIS — J9811 Atelectasis: Secondary | ICD-10-CM | POA: Diagnosis not present

## 2016-12-02 DIAGNOSIS — Z8673 Personal history of transient ischemic attack (TIA), and cerebral infarction without residual deficits: Secondary | ICD-10-CM | POA: Diagnosis not present

## 2016-12-02 DIAGNOSIS — Z791 Long term (current) use of non-steroidal anti-inflammatories (NSAID): Secondary | ICD-10-CM | POA: Insufficient documentation

## 2016-12-02 DIAGNOSIS — I10 Essential (primary) hypertension: Secondary | ICD-10-CM | POA: Insufficient documentation

## 2016-12-02 DIAGNOSIS — R1033 Periumbilical pain: Secondary | ICD-10-CM | POA: Diagnosis not present

## 2016-12-02 DIAGNOSIS — Z79899 Other long term (current) drug therapy: Secondary | ICD-10-CM | POA: Insufficient documentation

## 2016-12-02 DIAGNOSIS — R109 Unspecified abdominal pain: Secondary | ICD-10-CM | POA: Diagnosis present

## 2016-12-02 DIAGNOSIS — Z7982 Long term (current) use of aspirin: Secondary | ICD-10-CM | POA: Diagnosis not present

## 2016-12-02 DIAGNOSIS — K439 Ventral hernia without obstruction or gangrene: Secondary | ICD-10-CM | POA: Diagnosis not present

## 2016-12-02 LAB — COMPREHENSIVE METABOLIC PANEL
ALBUMIN: 4.2 g/dL (ref 3.5–5.0)
ALK PHOS: 66 U/L (ref 38–126)
ALT: 31 U/L (ref 17–63)
AST: 28 U/L (ref 15–41)
Anion gap: 4 — ABNORMAL LOW (ref 5–15)
BILIRUBIN TOTAL: 1.7 mg/dL — AB (ref 0.3–1.2)
BUN: 9 mg/dL (ref 6–20)
CALCIUM: 9.2 mg/dL (ref 8.9–10.3)
CO2: 29 mmol/L (ref 22–32)
Chloride: 105 mmol/L (ref 101–111)
Creatinine, Ser: 1.11 mg/dL (ref 0.61–1.24)
GFR calc Af Amer: >60 mL/min (ref >60)
GFR calc non Af Amer: >60 mL/min (ref >60)
GLUCOSE: 122 mg/dL — AB (ref 65–99)
POTASSIUM: 4 mmol/L (ref 3.5–5.1)
Sodium: 138 mmol/L (ref 135–145)
TOTAL PROTEIN: 7.3 g/dL (ref 6.5–8.1)

## 2016-12-02 LAB — CBC
HEMATOCRIT: 39.9 % (ref 39.0–52.0)
Hemoglobin: 13.4 g/dL (ref 13.0–17.0)
MCH: 30.5 pg (ref 26.0–34.0)
MCHC: 33.6 g/dL (ref 30.0–36.0)
MCV: 90.9 fL (ref 78.0–100.0)
Platelets: 194 10*3/uL (ref 150–400)
RBC: 4.39 MIL/uL (ref 4.22–5.81)
RDW: 13.3 % (ref 11.5–15.5)
WBC: 4.5 10*3/uL (ref 4.0–10.5)

## 2016-12-02 LAB — LIPASE, BLOOD: Lipase: 25 U/L (ref 11–51)

## 2016-12-02 MED ORDER — MORPHINE SULFATE (PF) 4 MG/ML IV SOLN
4.0000 mg | Freq: Once | INTRAVENOUS | Status: AC
  Administered 2016-12-02: 4 mg via INTRAVENOUS
  Filled 2016-12-02: qty 1

## 2016-12-02 MED ORDER — DOCUSATE SODIUM 100 MG PO CAPS: 100.0000 mg | ORAL_CAPSULE | Freq: Every day | ORAL | 0 refills | Status: DC | PRN

## 2016-12-02 MED ORDER — HYDROCODONE-ACETAMINOPHEN 5-325 MG PO TABS: 1.0000 | ORAL_TABLET | Freq: Four times a day (QID) | ORAL | 0 refills | Status: DC | PRN

## 2016-12-02 MED ORDER — POLYETHYLENE GLYCOL 3350 17 GM/SCOOP PO POWD: 17.0000 g | Freq: Every day | ORAL | 0 refills | Status: DC

## 2016-12-02 MED ORDER — LORAZEPAM 2 MG/ML IJ SOLN
1.0000 mg | Freq: Once | INTRAMUSCULAR | Status: AC
  Administered 2016-12-02: 1 mg via INTRAVENOUS
  Filled 2016-12-02: qty 1

## 2016-12-02 NOTE — ED Triage Notes (Signed)
Pt reports onset this am of severe abd pain and reports having a mass. [facility] to [facility] and sent here for hernia, possibly incarcerated. Pt appears uncomfortable at triage.

## 2016-12-02 NOTE — ED Notes (Signed)
Patient transported to X-ray 

## 2016-12-02 NOTE — ED Provider Notes (Signed)
[facility]-EMERGENCY DEPT Provider Note   CSN: [id number] Arrival date & time: 12/02/16  1045   History   Chief Complaint Chief Complaint  Patient presents with  . Abdominal Pain  . Hernia    HPI Jesse Ho is a 41 y.o. male.  Patient reports abrupt onset of abdominal pain this morning.  He reports a palpable mass on his abdomen above his navel.  He reports that mass is exquisitely tender to touch.  He admits to moving boxes a couple of days ago but no other strenuous activity, injury.  He was seen by his PCP at [facility] today, who voiced concern for possible incarcerated hernia and subsequently sent patient to ED for evaluation.  Patient denies palpable mass/ protrusion prior to today.  He has not had a BM or passed flatus this am.  He denies blood in stool yesterday.  No nausea, vomiting, fevers.  Allergic to Keflex only.  Did not take any medications today.  No h/o abdominal surgeries in the past.  Per mother at bedside, patient's father had several hernias that required surgical repair.      Past Medical History:  Diagnosis Date  . Cervical dystonia   . CVA (cerebral vascular accident) (HCC)   . Hyperlipidemia   . Hypertension     Patient Active Problem List   Diagnosis Date Noted  . Left-sided weakness 11/04/2016  . Left facial numbness 10/20/2016  . TIA (transient ischemic attack)   . Cervical dystonia 05/20/2016    Past Surgical History:  Procedure Laterality Date  . CYSTECTOMY Left 10/19/2014   left branchial cleft cyst removed  . FRACTURE SURGERY     fx fingers on left hand playing football  . TEE WITHOUT CARDIOVERSION N/A 10/27/2016   Procedure: TRANSESOPHAGEAL ECHOCARDIOGRAM (TEE);  Surgeon: Yates Decamp, MD;  Location: [facility] ENDOSCOPY;  Service: Cardiovascular;  Laterality: N/A;       Home Medications    Prior to Admission medications   Medication Sig Start Date End Date Taking? Authorizing Provider  aspirin EC 81 MG tablet Take 1 tablet (81 mg total) by  mouth daily. 10/20/16   Richarda Overlie, MD  atorvastatin (LIPITOR) 10 MG tablet Take 10 mg by mouth at bedtime.  10/22/16   [provider]  Cholecalciferol (VITAMIN D-3) 5000 units TABS Take 5,000 Units by mouth daily.    [provider]  ibuprofen (ADVIL,MOTRIN) 200 MG tablet Take 200 mg by mouth every 6 (six) hours as needed for headache (pain).    [provider]  lisinopril-hydrochlorothiazide (PRINZIDE,ZESTORETIC) 10-12.5 MG tablet Take 2 tablets by mouth daily.  11/04/16   [provider]  triamterene-hydrochlorothiazide (DYAZIDE) 37.5-25 MG capsule Take 1 each (1 capsule total) by mouth daily. Patient not taking: Reported on 11/13/2016 10/20/16 10/20/17  Richarda Overlie, MD    Family History Family History  Problem Relation Age of Onset  . Hyperlipidemia Other   . Hypertension Other   . Diabetes Other   . Obesity Other   . Diabetes Sister     Social History Social History  Substance Use Topics  . Smoking status: Never Smoker  . Smokeless tobacco: Never Used  . Alcohol use Yes     Allergies   Keflex [cephalexin]   Review of Systems Review of Systems  Constitutional: Negative for chills, diaphoresis, fatigue and fever.  Gastrointestinal: Positive for abdominal pain. Negative for abdominal distention, blood in stool, constipation, diarrhea, nausea and vomiting.     Physical Exam Updated Vital Signs BP Marland Kitchen)  138/91   Pulse 60   Temp 97.7 F (36.5 C) (Oral)   Resp 18   SpO2 94%   Physical Exam  Constitutional: He is oriented to person, place, and time. He appears well-developed and well-nourished. He appears distressed (appears incomfortable).  Spouse at bedside  HENT:  Head: Normocephalic and atraumatic.  Mouth/Throat: Uvula is midline, oropharynx is clear and moist and mucous membranes are normal.  Mallampati 1  Eyes: Conjunctivae and EOM are normal. Pupils are equal, round, and reactive to light. No scleral icterus.  Neck: Normal  range of motion. Neck supple.  Cardiovascular: Normal rate, regular rhythm, normal heart sounds and intact distal pulses.   No murmur heard. Pulmonary/Chest: Effort normal and breath sounds normal. No respiratory distress.  Abdominal: Soft. He exhibits mass (supraumbilical mass). Bowel sounds are decreased. There is tenderness. There is guarding. A hernia (supraumbilical) is present. Hernia confirmed positive in the ventral area.    Musculoskeletal: Normal range of motion. He exhibits no edema.  Neurological: He is alert and oriented to person, place, and time.  Skin: Skin is warm. Capillary refill takes less than 2 seconds. He is not diaphoretic. No erythema.  Psychiatric: He has a normal mood and affect. His behavior is normal. Judgment and thought content normal.  Nursing note and vitals reviewed.  ED Treatments / Results  Labs (all labs ordered are listed, but only abnormal results are displayed) Labs Reviewed  COMPREHENSIVE METABOLIC PANEL - Abnormal; Notable for the following:       Result Value   Glucose, Bld 122 (*)    Total Bilirubin 1.7 (*)    Anion gap 4 (*)    All other components within normal limits  LIPASE, BLOOD  CBC  URINALYSIS, ROUTINE W REFLEX MICROSCOPIC    EKG  EKG Interpretation None       Radiology No results found.  Procedures Procedures (including critical care time)  Medications Ordered in ED Medications  LORazepam (ATIVAN) injection 1 mg (not administered)  morphine 4 MG/ML injection 4 mg (4 mg Intravenous Given 12/02/16 1159)     Initial Impression / Assessment and Plan / ED Course  I have reviewed the triage vital signs and the nursing notes.  Pertinent labs & imaging results that were available during my care of the patient were reviewed by me and considered in my medical decision making (see chart for details).    1128: CMP, CBC pending.  Morphine IV 4mg  ordered.  Ice packs ordered.  Will reattempt reduction of hernia after pain meds  administered.  1212: s/p Morphine, abdominal pain a little better.  Tenderness on exam slightly improved.  Will apply ice packs and attempt reduction.  1221: Ativan 1mg  IV ordered.  1315:  Hernia reducible.  Pain improving.    Final Clinical Impressions(s) / ED Diagnoses   Final diagnoses:  Epigastric hernia   Tamera PuntDavid G Stolze is a 41 y.o. male that presented to ED for painful hernia concerning for hernia incarceration.  He had normal labs, stable VS, and an abdominal xray that showed no evidence of obstruction.  He was given IV pain medication and ativan.  His hernia reduced after this and pain improved.  Strict return precautions and discharge instructions reviewed.  He was given the contact information for Dr Doreen SalvageWakefield's office, general surgeon on call, and instructed to call for an appointment soon to discuss elective surgery for hernia.  He was discharged with a short supply of Norco, Miralax and Colace.  He was discharged  in stable condition home with his spouse.  [place] narcotic database reviewed.  No red flags.  New Prescriptions New Prescriptions   DOCUSATE SODIUM (COLACE) 100 MG CAPSULE    Take 1 capsule (100 mg total) by mouth daily as needed for mild constipation or moderate constipation.   HYDROCODONE-ACETAMINOPHEN (NORCO) 5-325 MG TABLET    Take 1 tablet by mouth every 6 (six) hours as needed for moderate pain.   POLYETHYLENE GLYCOL POWDER (MIRALAX) POWDER    Take 17 g by mouth daily.     Delynn Flavin [place], DO 12/02/16 1320    Jacalyn Lefevre, MD 12/02/16 [phone]

## 2016-12-02 NOTE — Discharge Instructions (Signed)
You were seen in the emergency department for an epigastric hernia.  You were given IV pain medications in the ED.  Your hernia reduced and therefore emergent surgery was not needed.  You should call Dr Doreen SalvageWakefield's office (general surgeon) for an appointment soon to be evaluated for elective hernia repair, as hernias do not typically resolve.  You should NOT do any heavy lifting/ strenuous activity until you are evaluated.  You should keep your stools soft w/ a stool softener/ Miralax.  You are being discharged with a short supply of pain medications to use as needed for pain.  You are also being discharged with medications to keep your stools soft.  If your symptoms return and you are not able to reduce your hernia as we discussed, you should seek immediate evaluation.

## 2016-12-03 ENCOUNTER — Ambulatory Visit: Payer: BLUE CROSS/BLUE SHIELD | Admitting: Physical Therapy

## 2016-12-03 ENCOUNTER — Encounter: Payer: BLUE CROSS/BLUE SHIELD | Admitting: Occupational Therapy

## 2016-12-04 DIAGNOSIS — R002 Palpitations: Secondary | ICD-10-CM | POA: Diagnosis not present

## 2016-12-14 ENCOUNTER — Ambulatory Visit: Payer: BLUE CROSS/BLUE SHIELD | Attending: Internal Medicine | Admitting: Physical Therapy

## 2016-12-16 ENCOUNTER — Ambulatory Visit: Payer: BLUE CROSS/BLUE SHIELD | Admitting: Physical Therapy

## 2016-12-21 ENCOUNTER — Ambulatory Visit: Payer: BLUE CROSS/BLUE SHIELD | Admitting: Neurology

## 2016-12-22 ENCOUNTER — Ambulatory Visit: Payer: BLUE CROSS/BLUE SHIELD | Admitting: Physical Therapy

## 2016-12-23 DIAGNOSIS — Z8673 Personal history of transient ischemic attack (TIA), and cerebral infarction without residual deficits: Secondary | ICD-10-CM | POA: Diagnosis not present

## 2016-12-23 DIAGNOSIS — I209 Angina pectoris, unspecified: Secondary | ICD-10-CM | POA: Diagnosis not present

## 2016-12-23 DIAGNOSIS — I10 Essential (primary) hypertension: Secondary | ICD-10-CM | POA: Diagnosis not present

## 2016-12-23 DIAGNOSIS — Z0189 Encounter for other specified special examinations: Secondary | ICD-10-CM | POA: Diagnosis not present

## 2016-12-24 ENCOUNTER — Ambulatory Visit: Payer: BLUE CROSS/BLUE SHIELD

## 2016-12-24 DIAGNOSIS — K439 Ventral hernia without obstruction or gangrene: Secondary | ICD-10-CM | POA: Diagnosis not present

## 2016-12-24 DIAGNOSIS — I10 Essential (primary) hypertension: Secondary | ICD-10-CM | POA: Diagnosis not present

## 2016-12-24 DIAGNOSIS — R531 Weakness: Secondary | ICD-10-CM | POA: Diagnosis not present

## 2016-12-25 ENCOUNTER — Ambulatory Visit (INDEPENDENT_AMBULATORY_CARE_PROVIDER_SITE_OTHER): Payer: BLUE CROSS/BLUE SHIELD | Admitting: Neurology

## 2016-12-25 DIAGNOSIS — R531 Weakness: Secondary | ICD-10-CM

## 2016-12-25 DIAGNOSIS — R200 Anesthesia of skin: Secondary | ICD-10-CM

## 2016-12-25 DIAGNOSIS — G5602 Carpal tunnel syndrome, left upper limb: Secondary | ICD-10-CM | POA: Insufficient documentation

## 2016-12-25 NOTE — Progress Notes (Signed)
PATIENT: Jesse Ho DOB: April 22, 1976  No chief complaint on file.    HISTORICAL  ROBERTLEE ROGACKI is a 41 years old right-handed male, he is here to follow-up his most recent hospital discharge.   He had a history of hyperlipidemia, presented to the emergency room on Oct 19 2016 complaining of intermittent left arm numbness and weakness, left-sided chest pain, had extensive evaluations, I personally reviewed MRI of the brain that was essentially normal, MRA of the brain was normal, Echocardiogram on Oct 27 2016 showed no significant abnormality, wall motion was normal, Ultrasound of carotid artery on Oct 22 2016 showed no significant abnormality  Laboratory evaluation showed normal TSH, B12, A1c was 4.6, lipid profile showed cholesterol of 149, LDL of 97,, normal liver functional tests, normal CBC, BMP,  Per patient, he had more cardiac evaluation by cardiologist Dr. Jacinto Halim.  I saw him in December 2017, referred by my colleague Dr. Marjory Lies for evaluation of potential  EMG guided botulism toxin A injection for abnormal neck posturing, left-sided neck pain, his primary care physician is Dr. 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  For a while, he complains that his symptoms have gradually getting worse, was noted to has mild abnormal posturing, mild right shoulder elevation, left tilt, slight retrocollis. He is referred for potential EMG guided botulism toxin injection,  CT of soft tissue neck with contrast in December 2017 postsurgical change of left cervical brachial cleft cyst, no  complicating features  On today's interview, he reported improvement of neck posturing, there was no limitation on neck movement.  Today, he focus on continued left arm and leg weakness, intermittent severe muscle spasm, also left mouth corner of droopy, intermittent paresthesia involving different part of left side of body, there was a concern of nonphysiological component in his complains  Update December 25 2016: I reviewed his emergency room presentation on November 13 2016, headache, left groin discomfort,  Laboratory evaluations, normal CMP, CBC,  I personally reviewed MRI of the brain MRA of head on Oct 20 2016 that was normal.   Second emergency presentation on December 02 2016: Worry about a palpable abdomen mass, X-ray of abdomen showed no acute intracranial abnormality,  He continue complains of left facial paresthesia, left arm leg intermittent discomfort, feeling weak on the left side  EMG nerve conduction study today showed mild left carpal tunnel syndrome, otherwise normal, there is no evidence of left cervical radiculopathy, left lumbosacral radiculopathy.  REVIEW OF SYSTEMS: Full 14 system review of systems performed and notable only for numbness, weakness, facial drooping, muscle cramps   ALLERGIES: Allergies  Allergen Reactions  . Keflex [Cephalexin] Anaphylaxis    HOME MEDICATIONS: Current Outpatient Prescriptions  Medication Sig Dispense Refill  . aspirin EC 81 MG tablet Take 1 tablet (81 mg total) by mouth daily. 30 tablet 2  . atorvastatin (LIPITOR) 10 MG tablet Take 10 mg by mouth at bedtime.   1  . Cholecalciferol (VITAMIN D-3) 5000 units TABS Take 5,000 Units by mouth daily.    Marland Kitchen docusate sodium (COLACE) 100 MG capsule Take 1  capsule (100 mg total) by mouth daily as needed for mild constipation or moderate constipation. 30 capsule 0  . HYDROcodone-acetaminophen (NORCO) 5-325 MG tablet Take 1 tablet by mouth every 6 (six) hours as needed for moderate pain. 6 tablet 0    . ibuprofen (ADVIL,MOTRIN) 200 MG tablet Take 200 mg by mouth every 6 (six) hours as needed for headache (pain).    Marland Kitchen lisinopril-hydrochlorothiazide (PRINZIDE,ZESTORETIC) 10-12.5 MG tablet Take 2 tablets by mouth daily.   0  . polyethylene glycol powder (MIRALAX) powder Take 17 g by mouth daily. 255 g 0  . triamterene-hydrochlorothiazide (DYAZIDE) 37.5-25 MG capsule Take 1 each (1 capsule total) by mouth daily. (Patient not taking: Reported on 11/13/2016) 30 capsule 11   No current facility-administered medications for this visit.     PAST MEDICAL HISTORY: Past Medical History:  Diagnosis Date  . Cervical dystonia   . CVA (cerebral vascular accident) (HCC)   . Hyperlipidemia   . Hypertension     PAST SURGICAL HISTORY: Past Surgical History:  Procedure Laterality Date  . CYSTECTOMY Left 10/19/2014   left branchial cleft cyst removed  . FRACTURE SURGERY     fx fingers on left hand playing football  . TEE WITHOUT CARDIOVERSION N/A 10/27/2016   Procedure: TRANSESOPHAGEAL ECHOCARDIOGRAM (TEE);  Surgeon: Yates Decamp, MD;  Location: [facility] ENDOSCOPY;  Service: Cardiovascular;  Laterality: N/A;    FAMILY HISTORY: Family History  Problem Relation Age of Onset  . Hyperlipidemia Other   . Hypertension Other   . Diabetes Other   . Obesity Other   . Diabetes Sister     SOCIAL HISTORY:  Social History   Social History  . Marital status: Married    Spouse name: Lowella Bandy  . Number of children: 2  . Years of education: 12+   Occupational History  .      [employer], designs websites   Social History Main Topics  . Smoking status: Never Smoker  . Smokeless tobacco: Never Used  . Alcohol use Yes  . Drug use: No  . Sexual activity: Not on file   Other Topics Concern  . Not on file   Social History Narrative   Lives at home w/ his wife and children   Right-handed   Caffeine: 3 cups of coffee/tea     PHYSICAL EXAM   There were no vitals filed for this visit.  Not recorded       There is no height or weight on file to calculate BMI.  PHYSICAL EXAMNIATION:  Gen: NAD, conversant, well nourised, obese, well groomed                     Cardiovascular: Regular rate rhythm, no peripheral edema, warm, nontender. Eyes: Conjunctivae clear without exudates or hemorrhage Neck: Supple, no carotid bruits. Pulmonary: Clear to auscultation bilaterally   NEUROLOGICAL EXAM:  MENTAL STATUS: Speech:    Speech is normal; fluent and spontaneous with normal comprehension.  Cognition:     Orientation to time, place and person     Normal recent and remote memory     Normal Attention span and concentration     Normal Language, naming, repeating,spontaneous speech     Fund of knowledge   CRANIAL NERVES: CN II: Visual fields are full to confrontation. Fundoscopic exam is normal with sharp discs and no vascular changes. Pupils are round equal and briskly reactive to light. CN III, IV, VI: extraocular movement are normal. No ptosis. CN V: Facial sensation is intact  to pinprick in all 3 divisions bilaterally. Corneal responses are intact.  CN VII: Face is symmetric with normal eye closure and smile. CN VIII: Hearing is normal to rubbing fingers CN IX, X: Palate elevates symmetrically. Phonation is normal. CN XI: Head turning and shoulder shrug are intact CN XII: Tongue is midline with normal movements and no atrophy.  MOTOR: Variable effort, fixation of left upper extremity upon rapid rotating movement   REFLEXES: Reflexes are 2+ and symmetric at the biceps, triceps, knees, and ankles. Plantar responses are flexor.  SENSORY: Intact to light touch, pinprick, positional sensation and vibratory sensation are intact in fingers and toes.  COORDINATION: Rapid alternating movements and fine finger movements are intact. There is no dysmetria on finger-to-nose and heel-knee-shin.    GAIT/STANCE: Posture is normal. Gait is steady with normal steps, mild difficulty perform tandem  walking    DIAGNOSTIC DATA (LABS, IMAGING, TESTING) - I reviewed patient records, labs, notes, testing and imaging myself where available.   ASSESSMENT AND PLAN  Tamera PuntDavid G Krapf is a 41 y.o. male   Left upper and lower extremity muscle spasm, intermittent paresthesia   EMG/NCS showed mild left carpal tunnel syndromes  MRI of cervical spine was ordered, will call him result  Continue aspirin 81 mg daily  Continue to address hyperlipidemia, hypertension    Levert FeinsteinYijun Croix Presley, M.D. Ph.D.  [facility] [address], [address] [place], [place] [address] Ph: [phone] Fax: [phone]  CC: Referring Provider

## 2016-12-25 NOTE — Procedures (Signed)
Full Name: Naida SleightDavid Grisanti Gender: Male MRN #: [id number] Date of Birth: [birth year]    Visit Date: 12/25/16 09:54 Age: 41 Years 5 Months Old Examining Physician: Levert FeinsteinYijun Kalyn Dimattia, MD  Referring Physician: Terrace ArabiaYan, MD History: 41 years old male, presented with intermittent left facial, upper and lower extremity paresthesia  Summary of the test: Nerve conduction study: Left median mixed response showed 0.4 milliseconds prolonged latency in comparison to ipsilateral ulnar mixed response. Bilateral median motor and sensory responses were normal.  Left superficial peroneal  and sural sensory responses were normal. Left peroneal to EDB and tibial motor responses were normal.   Electromyography:  Selective needle examination of left upper and lower extremity muscles and left cervical and lumbosacral paraspinal muscles were normal.   Conclusion: This is a mild abnormal study. 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 There is no evidence of left cervical radiculopathy.     ------------------------------- Levert FeinsteinYijun Kailiana Granquist, M.D.  [facility] [address] [place], [place] [address] Tel: [phone] Fax: [phone]        [facility]    Nerve / Sites Muscle Latency Ref. Amplitude Ref. Rel Amp Segments Distance Velocity Ref. Area    ms ms mV mV %  cm m/s m/s mVms  L Median - APB     Wrist APB 3.9 ?4.4 8.9 ?4.0 100 Wrist - APB 7   31.5     Upper arm APB 7.7  8.7  97.7 Upper arm - Wrist 23 60 ?49 31.5  R Median - APB     Wrist APB 3.1 ?4.4 11.2 ?4.0 100 Wrist - APB 7   33.6     Upper arm APB 7.0  10.5  94 Upper arm - Wrist 22 56 ?49 31.6  L Ulnar - ADM     Wrist ADM 2.8 ?3.3 10.9 ?6.0 100 Wrist - ADM 7   39.3     B.Elbow ADM 6.1  10.1  93.2 B.Elbow - Wrist 21 62 ?49 39.6     A.Elbow ADM 8.5  9.8  96.5 A.Elbow - B.Elbow 12 51 ?49 39.6         A.Elbow - Wrist      L Peroneal - EDB     Ankle EDB 4.8 ?6.5 8.9  ?2.0 100 Ankle - EDB 9   26.5     Fib head EDB 11.2  8.3  93.4 Fib head - Ankle 30 47 ?44 25.9     Pop fossa EDB 13.2  7.7  93.5 Pop fossa - Fib head 10 51 ?44 26.4         Pop fossa - Ankle      L Tibial - AH     Ankle AH 4.3 ?5.8 12.2 ?4.0 100 Ankle - AH 9   30.8     Pop fossa AH 12.8  8.4  68.9 Pop fossa - Ankle 37 43 ?41 28.5               SNC    Nerve / Sites Rec. Site Peak Lat Ref.  Amp Ref. Segments Distance Peak Diff Ref.    ms ms V V  cm ms ms  L Sural - Ankle (Calf)     Calf Ankle 3.8 ?4.4 38 ?6 Calf - Ankle 14    L Superficial peroneal - Ankle     Lat leg Ankle 3.6 ?4.4 20 ?6 Lat leg -  Ankle 14    L Median, Ulnar - Transcarpal comparison     Median Palm Wrist 2.6 ?2.2 68 ?35 Median Palm - Wrist 8       Ulnar Palm Wrist 2.1 ?2.2 25 ?12 Ulnar Palm - Wrist 8          Median Palm - Ulnar Palm  0.4 ?0.4  R Median, Ulnar - Transcarpal comparison     Median Palm Wrist 2.1 ?2.2 77 ?35 Median Palm - Wrist 8       Ulnar Palm Wrist 1.9 ?2.2 18 ?12 Ulnar Palm - Wrist 8          Median Palm - Ulnar Palm  0.2 ?0.4  L Median - Orthodromic (Dig II, Mid palm)     Dig II Wrist 3.4 ?3.4 22 ?10 Dig II - Wrist 13    R Median - Orthodromic (Dig II, Mid palm)     Dig II Wrist 2.9 ?3.4 26 ?10 Dig II - Wrist 13    R Ulnar - Orthodromic, (Dig V, Mid palm)     Dig V Wrist 2.6 ?3.1 12 ?5 Dig V - Wrist 76                     F  Wave    Nerve F Lat Ref.   ms ms  L Ulnar - ADM 27.2 ?32.0  L Tibial - AH 47.0 ?56.0         EMG full       EMG Summary Table    Spontaneous MUAP Recruitment  Muscle IA Fib PSW Fasc Other Amp Dur. Poly Pattern  L. Pronator teres Normal None None None _______ Normal Normal Normal Normal  L. Deltoid Normal None None None _______ Normal Normal Normal Normal  L. Biceps brachii Normal None None None _______ Normal Normal Normal Normal  L. Triceps brachii Normal None None None _______ Normal Normal Normal Normal  L. Tibialis anterior Normal None None None _______  Normal Normal Normal Normal  L. Tibialis posterior Normal None None None _______ Normal Normal Normal Normal  L. Vastus lateralis Normal None None None _______ Normal Normal Normal Normal  L. Lumbar paraspinals (mid) Normal None None None _______ Normal Normal Normal Normal  L. Lumbar paraspinals (low) Normal None None None _______ Normal Normal Normal Normal  L. Cervical paraspinals Normal None None None _______ Normal Normal Normal Normal

## 2016-12-28 ENCOUNTER — Encounter: Payer: Self-pay | Admitting: Neurology

## 2016-12-28 DIAGNOSIS — I10 Essential (primary) hypertension: Secondary | ICD-10-CM | POA: Diagnosis not present

## 2016-12-28 DIAGNOSIS — I200 Unstable angina: Secondary | ICD-10-CM | POA: Diagnosis not present

## 2016-12-30 ENCOUNTER — Ambulatory Visit: Payer: BLUE CROSS/BLUE SHIELD | Admitting: Physical Therapy

## 2016-12-31 ENCOUNTER — Encounter: Payer: Self-pay | Admitting: Neurology

## 2017-01-01 ENCOUNTER — Ambulatory Visit: Payer: BLUE CROSS/BLUE SHIELD | Admitting: Physical Therapy

## 2017-01-04 ENCOUNTER — Ambulatory Visit: Payer: BLUE CROSS/BLUE SHIELD | Admitting: Physical Therapy

## 2017-01-07 ENCOUNTER — Ambulatory Visit: Payer: BLUE CROSS/BLUE SHIELD | Admitting: Physical Therapy

## 2017-01-07 DIAGNOSIS — Z8673 Personal history of transient ischemic attack (TIA), and cerebral infarction without residual deficits: Secondary | ICD-10-CM | POA: Diagnosis not present

## 2017-01-07 DIAGNOSIS — I209 Angina pectoris, unspecified: Secondary | ICD-10-CM | POA: Diagnosis not present

## 2017-01-07 DIAGNOSIS — Z0189 Encounter for other specified special examinations: Secondary | ICD-10-CM | POA: Diagnosis not present

## 2017-01-07 DIAGNOSIS — I10 Essential (primary) hypertension: Secondary | ICD-10-CM | POA: Diagnosis not present

## 2017-02-18 ENCOUNTER — Emergency Department (HOSPITAL_COMMUNITY): Payer: BLUE CROSS/BLUE SHIELD

## 2017-02-18 ENCOUNTER — Encounter (HOSPITAL_COMMUNITY): Payer: Self-pay

## 2017-02-18 ENCOUNTER — Emergency Department (HOSPITAL_COMMUNITY)
Admission: EM | Admit: 2017-02-18 | Discharge: 2017-02-18 | Disposition: A | Discharge status: Home / Self Care | Payer: BLUE CROSS/BLUE SHIELD | Attending: Emergency Medicine | Admitting: Emergency Medicine

## 2017-02-18 DIAGNOSIS — K439 Ventral hernia without obstruction or gangrene: Secondary | ICD-10-CM | POA: Insufficient documentation

## 2017-02-18 DIAGNOSIS — K429 Umbilical hernia without obstruction or gangrene: Secondary | ICD-10-CM | POA: Insufficient documentation

## 2017-02-18 DIAGNOSIS — I10 Essential (primary) hypertension: Secondary | ICD-10-CM | POA: Insufficient documentation

## 2017-02-18 DIAGNOSIS — R1033 Periumbilical pain: Secondary | ICD-10-CM | POA: Diagnosis not present

## 2017-02-18 DIAGNOSIS — R197 Diarrhea, unspecified: Secondary | ICD-10-CM | POA: Diagnosis not present

## 2017-02-18 DIAGNOSIS — Z79899 Other long term (current) drug therapy: Secondary | ICD-10-CM | POA: Diagnosis not present

## 2017-02-18 DIAGNOSIS — Z7982 Long term (current) use of aspirin: Secondary | ICD-10-CM | POA: Insufficient documentation

## 2017-02-18 DIAGNOSIS — R1110 Vomiting, unspecified: Secondary | ICD-10-CM | POA: Diagnosis not present

## 2017-02-18 LAB — CBC WITH DIFFERENTIAL/PLATELET
Basophils Absolute: 0 10*3/uL (ref 0.0–0.1)
Basophils Relative: 0 %
Eosinophils Absolute: 0.1 10*3/uL (ref 0.0–0.7)
Eosinophils Relative: 1 %
HEMATOCRIT: 40.2 % (ref 39.0–52.0)
Hemoglobin: 13.3 g/dL (ref 13.0–17.0)
Lymphocytes Relative: 26 %
Lymphs Abs: 1.4 10*3/uL (ref 0.7–4.0)
MCH: 30.4 pg (ref 26.0–34.0)
MCHC: 33.1 g/dL (ref 30.0–36.0)
MCV: 92 fL (ref 78.0–100.0)
Monocytes Absolute: 0.5 10*3/uL (ref 0.1–1.0)
Monocytes Relative: 10 %
NEUTROS ABS: 3.5 10*3/uL (ref 1.7–7.7)
Neutrophils Relative %: 63 %
Platelets: 208 10*3/uL (ref 150–400)
RBC: 4.37 MIL/uL (ref 4.22–5.81)
RDW: 13.8 % (ref 11.5–15.5)
WBC: 5.5 10*3/uL (ref 4.0–10.5)

## 2017-02-18 LAB — COMPREHENSIVE METABOLIC PANEL
ALBUMIN: 4.2 g/dL (ref 3.5–5.0)
ALT: 27 U/L (ref 17–63)
ANION GAP: 7 (ref 5–15)
AST: 21 U/L (ref 15–41)
Alkaline Phosphatase: 54 U/L (ref 38–126)
BILIRUBIN TOTAL: 1.3 mg/dL — AB (ref 0.3–1.2)
BUN: 8 mg/dL (ref 6–20)
CO2: 27 mmol/L (ref 22–32)
Calcium: 9.1 mg/dL (ref 8.9–10.3)
Chloride: 103 mmol/L (ref 101–111)
Creatinine, Ser: 1.16 mg/dL (ref 0.61–1.24)
GFR calc Af Amer: >60 mL/min (ref >60)
GLUCOSE: 119 mg/dL — AB (ref 65–99)
POTASSIUM: 3.9 mmol/L (ref 3.5–5.1)
Sodium: 137 mmol/L (ref 135–145)
TOTAL PROTEIN: 7 g/dL (ref 6.5–8.1)

## 2017-02-18 LAB — I-STAT CG4 LACTIC ACID, ED: Lactic Acid, Venous: 1.42 mmol/L (ref 0.5–1.9)

## 2017-02-18 MED ORDER — POLYETHYLENE GLYCOL 3350 17 G PO PACK: 17.0000 g | PACK | Freq: Every day | ORAL | 0 refills | Status: DC

## 2017-02-18 MED ORDER — HYDROCODONE-ACETAMINOPHEN 5-325 MG PO TABS: 1.0000 | ORAL_TABLET | Freq: Four times a day (QID) | ORAL | 0 refills | Status: DC | PRN

## 2017-02-18 MED ORDER — IOPAMIDOL (ISOVUE-300) INJECTION 61%
INTRAVENOUS | Status: AC
  Administered 2017-02-18: 100 mL
  Filled 2017-02-18: qty 100

## 2017-02-18 MED ORDER — DOCUSATE SODIUM 100 MG PO CAPS: 100.0000 mg | ORAL_CAPSULE | Freq: Every day | ORAL | 0 refills | Status: DC | PRN

## 2017-02-18 MED ORDER — SODIUM CHLORIDE 0.9 % IV BOLUS (SEPSIS)
1000.0000 mL | Freq: Once | INTRAVENOUS | Status: AC
  Administered 2017-02-18: 1000 mL via INTRAVENOUS

## 2017-02-18 MED ORDER — MORPHINE SULFATE (PF) 4 MG/ML IV SOLN
4.0000 mg | Freq: Once | INTRAVENOUS | Status: AC
  Administered 2017-02-18: 4 mg via INTRAVENOUS
  Filled 2017-02-18: qty 1

## 2017-02-18 MED ORDER — ONDANSETRON HCL 4 MG/2ML IJ SOLN
4.0000 mg | Freq: Once | INTRAMUSCULAR | Status: AC
  Administered 2017-02-18: 4 mg via INTRAVENOUS
  Filled 2017-02-18: qty 2

## 2017-02-18 MED ORDER — PSYLLIUM 28 % PO PACK: 1.0000 | PACK | Freq: Two times a day (BID) | ORAL | 0 refills | Status: DC

## 2017-02-18 NOTE — ED Provider Notes (Signed)
[facility]-EMERGENCY DEPT Provider Note   CSN: [id number] Arrival date & time: 02/18/17  4098     History   Chief Complaint No chief complaint on file.   HPI Jesse Ho is a 41 y.o. male presenting with sudden onset supraumbilical pain from known hernia. Patient reports that it was bulging out and was having difficulty reducing it has experienced the worst pain so far. He reports that he has seen surgery back in June and was told that he had an umbilical hernia and supraumbilical and was scheduled for surgery on 03/17/2017. States that he has been able to reduce it without any problems but was unable to do it today. He has improved somewhat while in the emergency department as he is lying down flat but is still experiencing pain and nausea. States that his had a normal bowel movement yesterday but hasn't had a bowel movement today or passing any gas.    HPI  Past Medical History:  Diagnosis Date  . Cervical dystonia   . CVA (cerebral vascular accident) (HCC)   . Hyperlipidemia   . Hypertension     Patient Active Problem List   Diagnosis Date Noted  . 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 Left-sided weakness 11/04/2016  . Left facial numbness 10/20/2016  . TIA (transient ischemic attack)   . Cervical dystonia 05/20/2016    Past Surgical History:  Procedure Laterality Date  . CYSTECTOMY Left 10/19/2014   left branchial cleft cyst removed  . FRACTURE SURGERY     fx fingers on left hand playing football  . TEE WITHOUT CARDIOVERSION N/A 10/27/2016   Procedure: TRANSESOPHAGEAL ECHOCARDIOGRAM (TEE);  Surgeon: Yates Decamp, MD;  Location: [facility] ENDOSCOPY;  Service: Cardiovascular;  Laterality: N/A;       Home Medications    Prior to Admission medications   Medication Sig Start Date End Date Taking? Authorizing Provider  aspirin EC 81 MG tablet Take 1 tablet (81 mg total) by mouth daily. 10/20/16  Yes Richarda Overlie, MD  atorvastatin (LIPITOR) 10 MG tablet Take 10 mg  by mouth at bedtime.  10/22/16  Yes [provider]  eplerenone (INSPRA) 25 MG tablet Take 25 mg by mouth every morning. 01/07/17  Yes [provider]  ibuprofen (ADVIL,MOTRIN) 200 MG tablet Take 200 mg by mouth every 6 (six) hours as needed for headache (pain).   Yes [provider]  lisinopril-hydrochlorothiazide (PRINZIDE,ZESTORETIC) 20-25 MG tablet Take 1 tablet by mouth every morning. 12/23/16  Yes [provider]  metoprolol succinate (TOPROL-XL) 50 MG 24 hr tablet Take 50 mg by mouth daily. 12/23/16  Yes [provider]  nitroGLYCERIN (NITROSTAT) 0.4 MG SL tablet Place 1 tablet under the tongue every 5 (five) minutes as needed for chest pain.  12/23/16  Yes [provider]  verapamil (VERELAN PM) 180 MG 24 hr capsule Take 180 mg by mouth daily. 11/16/16  Yes [provider]  Cholecalciferol (VITAMIN D-3) 5000 units TABS Take 5,000 Units by mouth daily.    [provider]  docusate sodium (COLACE) 100 MG capsule Take 1 capsule (100 mg total) by mouth daily as needed for mild constipation or moderate constipation. 02/18/17 02/18/18  Georgiana Shore, PA-C  HYDROcodone-acetaminophen (NORCO) 5-325 MG tablet Take 1 tablet by mouth every 6 (six) hours as needed for severe pain. 02/18/17   Georgiana Shore, PA-C  lisinopril-hydrochlorothiazide (PRINZIDE,ZESTORETIC) 10-12.5 MG tablet Take 2 tablets by mouth daily.  11/04/16   [provider]  polyethylene  glycol (MIRALAX) packet Take 17 g by mouth daily. 02/18/17   Mathews Robinsons B, PA-C  psyllium (METAMUCIL SMOOTH TEXTURE) 28 % packet Take 1 packet by mouth 2 (two) times daily. 02/18/17   Georgiana Shore, PA-C  triamterene-hydrochlorothiazide (DYAZIDE) 37.5-25 MG capsule Take 1 each (1 capsule total) by mouth daily. Patient not taking: Reported on 11/13/2016 10/20/16 10/20/17  Richarda Overlie, MD    Family History Family History  Problem Relation Age of Onset  .  Hyperlipidemia Other   . Hypertension Other   . Diabetes Other   . Obesity Other   . Diabetes Sister     Social History Social History  Substance Use Topics  . Smoking status: Never Smoker  . Smokeless tobacco: Never Used  . Alcohol use Yes     Allergies   Keflex [cephalexin]   Review of Systems Review of Systems  Constitutional: Negative for chills and fever.  Respiratory: Negative for cough, shortness of breath, wheezing and stridor.   Cardiovascular: Negative for chest pain, palpitations and leg swelling.  Gastrointestinal: Positive for abdominal pain and nausea. Negative for abdominal distention, blood in stool, diarrhea and vomiting.  Genitourinary: Negative for difficulty urinating, dysuria, flank pain, hematuria, scrotal swelling and testicular pain.  Musculoskeletal: Negative for arthralgias, back pain, myalgias, neck pain and neck stiffness.  Skin: Negative for color change, pallor and rash.  Neurological: Negative for seizures, syncope, light-headedness and headaches.     Physical Exam Updated Vital Signs BP (!) 149/93   Pulse (!) 55   Temp 97.7 F (36.5 C) (Oral)   Resp (!) 23   SpO2 97%   Physical Exam  Constitutional: He appears well-developed and well-nourished. No distress.  Afebrile, nontoxic-appearing, lying comfortably in bed in no acute distress.  HENT:  Head: Normocephalic and atraumatic.  Eyes: Conjunctivae are normal.  Neck: Normal range of motion. Neck supple.  Cardiovascular: Normal rate, regular rhythm and normal heart sounds.   No murmur heard. Pulmonary/Chest: Effort normal and breath sounds normal. No respiratory distress. He has no wheezes. He has no rales.  Abdominal: Soft. He exhibits mass. He exhibits no distension. There is tenderness. There is no rebound and no guarding.  Umbilical small hernia, ttp. supraumbilical hernia appears reduced on exam while patient lying supine. Abdominal wall deficit appreciated and TTP.    Musculoskeletal: Normal range of motion. He exhibits no edema or deformity.  Neurological: He is alert.  Skin: Skin is warm and dry. No rash noted. He is not diaphoretic. No erythema. No pallor.  Psychiatric: He has a normal mood and affect.  Nursing note and vitals reviewed.    ED Treatments / Results  Labs (all labs ordered are listed, but only abnormal results are displayed) Labs Reviewed  COMPREHENSIVE METABOLIC PANEL - Abnormal; Notable for the following:       Result Value   Glucose, Bld 119 (*)    Total Bilirubin 1.3 (*)    All other components within normal limits  CBC WITH DIFFERENTIAL/PLATELET  I-STAT CG4 LACTIC ACID, ED    EKG  EKG Interpretation None       Radiology Ct Abdomen Pelvis W Contrast  Result Date: 02/18/2017 CLINICAL DATA:  Complaints of umbilical hernia pain since 11/2016. Denies nausea, vomiting, diarrhea or constipation EXAM: CT ABDOMEN AND PELVIS WITH CONTRAST TECHNIQUE: Multidetector CT imaging of the abdomen and pelvis was performed using the standard protocol following bolus administration of intravenous contrast. CONTRAST:  ISOVUE-300 IOPAMIDOL (ISOVUE-300) INJECTION 61% COMPARISON:  Abdominal radiographs,  12/02/2016 FINDINGS: Lower chest: No acute abnormality. Hepatobiliary: No focal liver abnormality is seen. No gallstones, gallbladder wall thickening, or biliary dilatation. Pancreas: Unremarkable. No pancreatic ductal dilatation or surrounding inflammatory changes. Spleen: Normal in size without focal abnormality. Adrenals/Urinary Tract: No adrenal masses. Bilateral small low-density renal masses. Largest arises from the left kidney lower pole measuring 2.3 cm with Hounsfield units near water density consistent with a simple cyst. Largest lesion on the right arises from the lower pole measuring 8 mm with Hounsfield units near water density also consistent with a cyst. There are no renal stones. No hydronephrosis. Ureters are normal course and  in caliber. Bladder is unremarkable. Stomach/Bowel: Stomach is within normal limits. Appendix appears normal. No evidence of bowel wall thickening, distention, or inflammatory changes. Vascular/Lymphatic: No significant vascular findings are present. No enlarged abdominal or pelvic lymph nodes. Reproductive: Prostate is unremarkable. Other: There are 2 adjacent umbilical hernias. There is a small fat containing umbilical hernia, base measuring 17 mm in width. Just superior to this there is a smaller hernia containing fat and low to intermediate attenuation material, which may reflect inflammation in herniated mesenteric. No bowel enters either of these hernias. No ascites. Musculoskeletal: No acute or significant osseous findings. IMPRESSION: 1. Two small adjacent umbilical hernias, the smaller of which, lying in the immediate supraumbilical position, containing some low to intermediate attenuation material that may reflect inflamed mesenteric. No bowel enters either of these hernias. The smaller hernia may account for the patient's umbilical hernia pain. 2. No other significant abnormalities within the abdomen or pelvis. Electronically Signed   By: Amie Portlandavid  Ormond M.D.   On: 02/18/2017 14:27    Procedures Procedures (including critical care time)  Medications Ordered in ED Medications  sodium chloride 0.9 % bolus 1,000 mL (0 mLs Intravenous Stopped 02/18/17 1358)  ondansetron (ZOFRAN) injection 4 mg (4 mg Intravenous Given 02/18/17 1241)  morphine 4 MG/ML injection 4 mg (4 mg Intravenous Given 02/18/17 1241)  iopamidol (ISOVUE-300) 61 % injection (100 mLs  Contrast Given 02/18/17 1358)     Initial Impression / Assessment and Plan / ED Course  I have reviewed the triage vital signs and the nursing notes.  Pertinent labs & imaging results that were available during my care of the patient were reviewed by me and considered in my medical decision making (see chart for details).    Patient presenting with  sudden onset of umbilical hernia pain with difficulty reducing it. He also reports associated nausea.  CT without bowel protruding through hernia. Surgical repair schedule 03/18/2017 with Dr. Andrey CampanileWilson. No concern for incarcerated hernia. Patient improved in ED. Patient's pain and nausea were managed and Patient discharged with symptomatic relief and follow up with surgery and PCP.  Advised hydration and fiber to avoid straining and constipation.  Discussed strict return precautions and advised to return to the emergency department if experiencing any new or worsening symptoms. Instructions were understood and patient agreed with discharge plan. Final Clinical Impressions(s) / ED Diagnoses   Final diagnoses:  Hernia of abdominal wall  Umbilical hernia without obstruction and without gangrene    New Prescriptions Discharge Medication List as of 02/18/2017  4:35 PM    START taking these medications   Details  polyethylene glycol (MIRALAX) packet Take 17 g by mouth daily., Starting Thu 02/18/2017, Print    psyllium (METAMUCIL SMOOTH TEXTURE) 28 % packet Take 1 packet by mouth 2 (two) times daily., Starting Thu 02/18/2017, Print  Gregary Cromer 02/18/17 2236    Loren Racer, MD 02/19/17 [phone]

## 2017-02-18 NOTE — ED Notes (Signed)
Patient transported to CT 

## 2017-02-18 NOTE — Discharge Instructions (Addendum)
As discussed, follow up with Dr. Andrey CampanileWilson in office regarding worsening pain and visit to ER.  CT didn't show any bowel protruding thought the abdominal wall defect. Take pain medication only as needed for pain. Drink plenty of fluids and fiber to avoid constipation. Return if mass returns that cannot be reduced and pain worsens.

## 2017-02-18 NOTE — ED Triage Notes (Signed)
Patient complains of umbilical hernia pain that he has surgery scheduled for 10/11. Reports this am the pain increased and states soreness to touch

## 2017-02-18 NOTE — ED Notes (Signed)
Back from CT

## 2017-02-24 DIAGNOSIS — Z8673 Personal history of transient ischemic attack (TIA), and cerebral infarction without residual deficits: Secondary | ICD-10-CM | POA: Diagnosis not present

## 2017-02-24 DIAGNOSIS — I10 Essential (primary) hypertension: Secondary | ICD-10-CM | POA: Diagnosis not present

## 2017-02-24 DIAGNOSIS — I209 Angina pectoris, unspecified: Secondary | ICD-10-CM | POA: Diagnosis not present

## 2017-02-24 DIAGNOSIS — Z0189 Encounter for other specified special examinations: Secondary | ICD-10-CM | POA: Diagnosis not present

## 2017-03-10 NOTE — Progress Notes (Signed)
Please place orders in EPIC as patient has a pre-op appointment on 03/15/2017! Thank you! 

## 2017-03-11 NOTE — Progress Notes (Signed)
Please place orders in EPIC as patient has a pre-op appointment on 03/15/2017! Thank you! 

## 2017-03-12 NOTE — Progress Notes (Signed)
Please place orders in EPIC as patient has a pre-op appointment on 03/15/2017! Thank you!

## 2017-03-12 NOTE — Progress Notes (Signed)
CBC WITH DIF, CMET, I STAT LACTIC ACID 10-23-16 EPIC, ECHO TEE 10-27-16 EPIC CAROTID DUPLEX 10-22-16 EPIC EKG 10-23-16 EPIC

## 2017-03-12 NOTE — Patient Instructions (Addendum)
Jesse Ho  03/12/2017   Your procedure is scheduled on: Friday 03-19-17  Report to [facility] Main  Entrance Take [place]  elevators to 3rd floor to  Short Stay Center at 530  AM.   Call this number if you have problems the morning of surgery [phone]   Remember: ONLY 1 PERSON MAY GO WITH YOU TO SHORT STAY TO GET  READY MORNING OF YOUR SURGERY.  Do not eat food or drink liquids :After Midnight.     Take these medicines the morning of surgery with A SIP OF WATER: HYDROCODONE IF NEEDED, METOPROLOL SUCCINATE, Verapamil                               You may not have any metal on your body including hair pins and              piercings  Do not wear jewelry, make-up, lotions, powders or perfumes, deodorant             Do not wear nail polish.  Do not shave  48 hours prior to surgery.              Men may shave face and neck.   Do not bring valuables to the hospital. [facility] IS NOT             RESPONSIBLE   FOR VALUABLES.  Contacts, dentures or bridgework may not be worn into surgery.  Leave suitcase in the car. After surgery it may be brought to your room.     Patients discharged the day of surgery will not be allowed to drive home.  Name and phone number of your driver:  Special Instructions: N/A              Please read over the following fact sheets you were given: _____________________________________________________________________             [facility] - Preparing for Surgery Before surgery, you can play an important role.  Because skin is not sterile, your skin needs to be as free of germs as possible.  You can reduce the number of germs on your skin by washing with CHG (chlorahexidine gluconate) soap before surgery.  CHG is an antiseptic cleaner which kills germs and bonds with the skin to continue killing germs even after washing. Please DO NOT use if you have an allergy to CHG or antibacterial soaps.  If your skin becomes  reddened/irritated stop using the CHG and inform your nurse when you arrive at Short Stay. Do not shave (including legs and underarms) for at least 48 hours prior to the first CHG shower.  You may shave your face/neck. Please follow these instructions carefully:  1.  Shower with CHG Soap the night before surgery and the  morning of Surgery.  2.  If you choose to wash your hair, wash your hair first as usual with your  normal  shampoo.  3.  After you shampoo, rinse your hair and body thoroughly to remove the  shampoo.                           4.  Use CHG as you would any other liquid soap.  You can apply chg directly  to the skin and wash  Gently with a scrungie or clean washcloth.  5.  Apply the CHG Soap to your body ONLY FROM THE NECK DOWN.   Do not use on face/ open                           Wound or open sores. Avoid contact with eyes, ears mouth and genitals (private parts).                       Wash face,  Genitals (private parts) with your normal soap.             6.  Wash thoroughly, paying special attention to the area where your surgery  will be performed.  7.  Thoroughly rinse your body with warm water from the neck down.  8.  DO NOT shower/wash with your normal soap after using and rinsing off  the CHG Soap.                9.  Pat yourself dry with a clean towel.            10.  Wear clean pajamas.            11.  Place clean sheets on your bed the night of your first shower and do not  sleep with pets. Day of Surgery : Do not apply any lotions/deodorants the morning of surgery.  Please wear clean clothes to the hospital/surgery center.  FAILURE TO FOLLOW THESE INSTRUCTIONS MAY RESULT IN THE CANCELLATION OF YOUR SURGERY PATIENT SIGNATURE_________________________________  NURSE SIGNATURE__________________________________  ________________________________________________________________________

## 2017-03-15 ENCOUNTER — Encounter (HOSPITAL_COMMUNITY): Payer: Self-pay

## 2017-03-15 ENCOUNTER — Encounter (HOSPITAL_COMMUNITY)
Admission: RE | Admit: 2017-03-15 | Discharge: 2017-03-15 | Disposition: A | Discharge status: Home / Self Care | Payer: BLUE CROSS/BLUE SHIELD | Source: Ambulatory Visit | Attending: General Surgery | Admitting: General Surgery

## 2017-03-15 DIAGNOSIS — Z01812 Encounter for preprocedural laboratory examination: Secondary | ICD-10-CM | POA: Insufficient documentation

## 2017-03-15 HISTORY — DX: Unspecified osteoarthritis, unspecified site: M19.90

## 2017-03-15 LAB — BASIC METABOLIC PANEL
Anion gap: 9 (ref 5–15)
BUN: 7 mg/dL (ref 6–20)
CALCIUM: 9.5 mg/dL (ref 8.9–10.3)
CO2: 28 mmol/L (ref 22–32)
Chloride: 100 mmol/L — ABNORMAL LOW (ref 101–111)
Creatinine, Ser: 1.08 mg/dL (ref 0.61–1.24)
GFR calc Af Amer: >60 mL/min (ref >60)
GLUCOSE: 101 mg/dL — AB (ref 65–99)
POTASSIUM: 4.2 mmol/L (ref 3.5–5.1)
Sodium: 137 mmol/L (ref 135–145)

## 2017-03-15 LAB — CBC
HEMATOCRIT: 41.7 % (ref 39.0–52.0)
Hemoglobin: 14.3 g/dL (ref 13.0–17.0)
MCH: 31.2 pg (ref 26.0–34.0)
MCHC: 34.3 g/dL (ref 30.0–36.0)
MCV: 90.8 fL (ref 78.0–100.0)
Platelets: 270 10*3/uL (ref 150–400)
RBC: 4.59 MIL/uL (ref 4.22–5.81)
RDW: 13.2 % (ref 11.5–15.5)
WBC: 5.4 10*3/uL (ref 4.0–10.5)

## 2017-03-18 NOTE — Anesthesia Preprocedure Evaluation (Signed)
Anesthesia Evaluation  Patient identified by MRN, date of birth, ID band Patient awake    Reviewed: Allergy & Precautions, NPO status , Patient's Chart, lab work & pertinent test results  Airway Mallampati: II  TM Distance: >3 FB Neck ROM: Full    Dental  (+) Dental Advisory Given   Pulmonary neg pulmonary ROS,    Pulmonary exam normal breath sounds clear to auscultation       Cardiovascular hypertension, Normal cardiovascular exam Rhythm:Regular Rate:Normal     Neuro/Psych TIAnegative neurological ROS  negative psych ROS   GI/Hepatic negative GI ROS, Neg liver ROS,   Endo/Other  Obesity  Renal/GU negative Renal ROS  negative genitourinary   Musculoskeletal  (+) Arthritis ,   Abdominal   Peds  Hematology negative hematology ROS (+)   Anesthesia Other Findings   Reproductive/Obstetrics                             Anesthesia Physical Anesthesia Plan  ASA: II  Anesthesia Plan: General   Post-op Pain Management:    Induction: Intravenous  PONV Risk Score and Plan: 3 and Ondansetron, Dexamethasone, Midazolam and Treatment may vary due to age or medical condition  Airway Management Planned: Oral ETT  Additional Equipment: None  Intra-op Plan:   Post-operative Plan: Extubation in OR  Informed Consent: I have reviewed the patients History and Physical, chart, labs and discussed the procedure including the risks, benefits and alternatives for the proposed anesthesia with the patient or authorized representative who has indicated his/her understanding and acceptance.   Dental advisory given  Plan Discussed with: CRNA  Anesthesia Plan Comments:         Anesthesia Quick Evaluation

## 2017-03-19 ENCOUNTER — Ambulatory Visit (HOSPITAL_COMMUNITY)
Admission: RE | Admit: 2017-03-19 | Discharge: 2017-03-19 | Disposition: A | Discharge status: Home / Self Care | Payer: BLUE CROSS/BLUE SHIELD | Source: Ambulatory Visit | Attending: General Surgery | Admitting: General Surgery

## 2017-03-19 ENCOUNTER — Encounter (HOSPITAL_COMMUNITY)
Admission: RE | Disposition: A | Discharge status: Home / Self Care | Payer: Self-pay | Source: Ambulatory Visit | Attending: General Surgery

## 2017-03-19 ENCOUNTER — Ambulatory Visit (HOSPITAL_COMMUNITY): Payer: BLUE CROSS/BLUE SHIELD | Admitting: Anesthesiology

## 2017-03-19 ENCOUNTER — Ambulatory Visit (HOSPITAL_COMMUNITY): Payer: BLUE CROSS/BLUE SHIELD

## 2017-03-19 ENCOUNTER — Encounter (HOSPITAL_COMMUNITY): Payer: Self-pay | Admitting: *Deleted

## 2017-03-19 DIAGNOSIS — Z7982 Long term (current) use of aspirin: Secondary | ICD-10-CM | POA: Diagnosis not present

## 2017-03-19 DIAGNOSIS — Z8673 Personal history of transient ischemic attack (TIA), and cerebral infarction without residual deficits: Secondary | ICD-10-CM | POA: Diagnosis not present

## 2017-03-19 DIAGNOSIS — K5900 Constipation, unspecified: Secondary | ICD-10-CM | POA: Insufficient documentation

## 2017-03-19 DIAGNOSIS — G459 Transient cerebral ischemic attack, unspecified: Secondary | ICD-10-CM | POA: Diagnosis not present

## 2017-03-19 DIAGNOSIS — G5602 Carpal tunnel syndrome, left upper limb: Secondary | ICD-10-CM | POA: Diagnosis not present

## 2017-03-19 DIAGNOSIS — E785 Hyperlipidemia, unspecified: Secondary | ICD-10-CM | POA: Insufficient documentation

## 2017-03-19 DIAGNOSIS — I10 Essential (primary) hypertension: Secondary | ICD-10-CM | POA: Diagnosis not present

## 2017-03-19 DIAGNOSIS — E669 Obesity, unspecified: Secondary | ICD-10-CM | POA: Insufficient documentation

## 2017-03-19 DIAGNOSIS — K429 Umbilical hernia without obstruction or gangrene: Secondary | ICD-10-CM | POA: Diagnosis not present

## 2017-03-19 DIAGNOSIS — Z79899 Other long term (current) drug therapy: Secondary | ICD-10-CM | POA: Diagnosis not present

## 2017-03-19 DIAGNOSIS — R0602 Shortness of breath: Secondary | ICD-10-CM | POA: Diagnosis not present

## 2017-03-19 DIAGNOSIS — Z6830 Body mass index (BMI) 30.0-30.9, adult: Secondary | ICD-10-CM | POA: Insufficient documentation

## 2017-03-19 DIAGNOSIS — K439 Ventral hernia without obstruction or gangrene: Secondary | ICD-10-CM | POA: Insufficient documentation

## 2017-03-19 HISTORY — PX: UMBILICAL HERNIA REPAIR: SHX196

## 2017-03-19 HISTORY — PX: INSERTION OF MESH: SHX5868

## 2017-03-19 SURGERY — REPAIR, HERNIA, UMBILICAL, LAPAROSCOPIC
Anesthesia: General | Site: Abdomen

## 2017-03-19 MED ORDER — PROPOFOL 10 MG/ML IV BOLUS
INTRAVENOUS | Status: AC
  Filled 2017-03-19: qty 40

## 2017-03-19 MED ORDER — VANCOMYCIN HCL 10 G IV SOLR
1500.0000 mg | INTRAVENOUS | Status: AC
  Administered 2017-03-19: 1500 mg via INTRAVENOUS
  Filled 2017-03-19: qty 1500

## 2017-03-19 MED ORDER — CHLORHEXIDINE GLUCONATE CLOTH 2 % EX PADS: 6.0000 | MEDICATED_PAD | Freq: Once | CUTANEOUS | Status: DC

## 2017-03-19 MED ORDER — BUPIVACAINE-EPINEPHRINE (PF) 0.25% -1:200000 IJ SOLN
INTRAMUSCULAR | Status: AC
  Filled 2017-03-19: qty 30

## 2017-03-19 MED ORDER — MIDAZOLAM HCL 2 MG/2ML IJ SOLN
INTRAMUSCULAR | Status: AC
  Filled 2017-03-19: qty 2

## 2017-03-19 MED ORDER — BUPIVACAINE-EPINEPHRINE 0.25% -1:200000 IJ SOLN
INTRAMUSCULAR | Status: DC | PRN
  Administered 2017-03-19: 60 mL

## 2017-03-19 MED ORDER — SODIUM CHLORIDE 0.9% FLUSH: 3.0000 mL | INTRAVENOUS | Status: DC | PRN

## 2017-03-19 MED ORDER — ACETAMINOPHEN 500 MG PO TABS: 1000.0000 mg | ORAL_TABLET | ORAL | Status: DC

## 2017-03-19 MED ORDER — OXYCODONE HCL 5 MG PO TABS
ORAL_TABLET | ORAL | Status: AC
  Filled 2017-03-19: qty 1

## 2017-03-19 MED ORDER — GABAPENTIN 300 MG PO CAPS: 300.0000 mg | ORAL_CAPSULE | ORAL | Status: DC

## 2017-03-19 MED ORDER — LIDOCAINE 2% (20 MG/ML) 5 ML SYRINGE
INTRAMUSCULAR | Status: AC
  Filled 2017-03-19: qty 5

## 2017-03-19 MED ORDER — MIDAZOLAM HCL 5 MG/5ML IJ SOLN
INTRAMUSCULAR | Status: DC | PRN
  Administered 2017-03-19: 2 mg via INTRAVENOUS

## 2017-03-19 MED ORDER — SUGAMMADEX SODIUM 200 MG/2ML IV SOLN
INTRAVENOUS | Status: AC
  Filled 2017-03-19: qty 2

## 2017-03-19 MED ORDER — 0.9 % SODIUM CHLORIDE (POUR BTL) OPTIME
TOPICAL | Status: DC | PRN
  Administered 2017-03-19: 1000 mL

## 2017-03-19 MED ORDER — ROCURONIUM BROMIDE 50 MG/5ML IV SOSY
PREFILLED_SYRINGE | INTRAVENOUS | Status: AC
Start: 2017-03-19 — End: 2017-03-19
  Filled 2017-03-19: qty 5

## 2017-03-19 MED ORDER — BUPIVACAINE LIPOSOME 1.3 % IJ SUSP
20.0000 mL | Freq: Once | INTRAMUSCULAR | Status: AC
  Administered 2017-03-19: 20 mL
  Filled 2017-03-19: qty 20

## 2017-03-19 MED ORDER — GABAPENTIN 300 MG PO CAPS: 300.0000 mg | ORAL_CAPSULE | Freq: Two times a day (BID) | ORAL | 2 refills | Status: DC

## 2017-03-19 MED ORDER — KETOROLAC TROMETHAMINE 30 MG/ML IJ SOLN
INTRAMUSCULAR | Status: AC
  Filled 2017-03-19: qty 1

## 2017-03-19 MED ORDER — GABAPENTIN 300 MG PO CAPS
ORAL_CAPSULE | ORAL | Status: AC
  Filled 2017-03-19: qty 1

## 2017-03-19 MED ORDER — ROCURONIUM BROMIDE 50 MG/5ML IV SOSY
PREFILLED_SYRINGE | INTRAVENOUS | Status: AC
  Filled 2017-03-19: qty 5

## 2017-03-19 MED ORDER — ONDANSETRON HCL 4 MG/2ML IJ SOLN
INTRAMUSCULAR | Status: DC | PRN
  Administered 2017-03-19: 4 mg via INTRAVENOUS

## 2017-03-19 MED ORDER — SODIUM CHLORIDE 0.9 % IV SOLN: 250.0000 mL | INTRAVENOUS | Status: DC | PRN

## 2017-03-19 MED ORDER — ACETAMINOPHEN 500 MG PO TABS
ORAL_TABLET | ORAL | Status: AC
  Filled 2017-03-19: qty 2

## 2017-03-19 MED ORDER — KETOROLAC TROMETHAMINE 30 MG/ML IJ SOLN
INTRAMUSCULAR | Status: DC | PRN
  Administered 2017-03-19: 30 mg via INTRAVENOUS

## 2017-03-19 MED ORDER — FENTANYL CITRATE (PF) 100 MCG/2ML IJ SOLN
INTRAMUSCULAR | Status: AC
  Filled 2017-03-19: qty 2

## 2017-03-19 MED ORDER — DEXAMETHASONE SODIUM PHOSPHATE 10 MG/ML IJ SOLN
INTRAMUSCULAR | Status: AC
  Filled 2017-03-19: qty 1

## 2017-03-19 MED ORDER — PROMETHAZINE HCL 25 MG/ML IJ SOLN: 6.2500 mg | INTRAMUSCULAR | Status: DC | PRN

## 2017-03-19 MED ORDER — FENTANYL CITRATE (PF) 100 MCG/2ML IJ SOLN
INTRAMUSCULAR | Status: DC | PRN
  Administered 2017-03-19: 50 ug via INTRAVENOUS
  Administered 2017-03-19: 100 ug via INTRAVENOUS
  Administered 2017-03-19 (×3): 50 ug via INTRAVENOUS

## 2017-03-19 MED ORDER — PROPOFOL 10 MG/ML IV BOLUS
INTRAVENOUS | Status: DC | PRN
  Administered 2017-03-19: 250 mg via INTRAVENOUS

## 2017-03-19 MED ORDER — OXYCODONE HCL 5 MG PO TABS: 5.0000 mg | ORAL_TABLET | ORAL | 0 refills | Status: DC | PRN

## 2017-03-19 MED ORDER — LACTATED RINGERS IV SOLN
INTRAVENOUS | Status: DC | PRN
  Administered 2017-03-19 (×2): via INTRAVENOUS

## 2017-03-19 MED ORDER — DEXAMETHASONE SODIUM PHOSPHATE 4 MG/ML IJ SOLN
INTRAMUSCULAR | Status: DC | PRN
  Administered 2017-03-19: 10 mg via INTRAVENOUS

## 2017-03-19 MED ORDER — SUGAMMADEX SODIUM 200 MG/2ML IV SOLN
INTRAVENOUS | Status: DC | PRN
  Administered 2017-03-19: 200 mg via INTRAVENOUS

## 2017-03-19 MED ORDER — ACETAMINOPHEN 325 MG PO TABS: 650.0000 mg | ORAL_TABLET | ORAL | Status: DC | PRN

## 2017-03-19 MED ORDER — ACETAMINOPHEN 500 MG PO TABS: 1000.0000 mg | ORAL_TABLET | Freq: Four times a day (QID) | ORAL | Status: DC

## 2017-03-19 MED ORDER — FENTANYL CITRATE (PF) 100 MCG/2ML IJ SOLN
25.0000 ug | INTRAMUSCULAR | Status: DC | PRN
  Administered 2017-03-19 (×2): 25 ug via INTRAVENOUS

## 2017-03-19 MED ORDER — FENTANYL CITRATE (PF) 100 MCG/2ML IJ SOLN
INTRAMUSCULAR | Status: AC
  Filled 2017-03-19: qty 4

## 2017-03-19 MED ORDER — ACETAMINOPHEN 650 MG RE SUPP
650.0000 mg | RECTAL | Status: DC | PRN
  Filled 2017-03-19: qty 1

## 2017-03-19 MED ORDER — ROCURONIUM BROMIDE 10 MG/ML (PF) SYRINGE
PREFILLED_SYRINGE | INTRAVENOUS | Status: DC | PRN
  Administered 2017-03-19: 5 mg via INTRAVENOUS
  Administered 2017-03-19: 45 mg via INTRAVENOUS
  Administered 2017-03-19: 10 mg via INTRAVENOUS

## 2017-03-19 MED ORDER — METOPROLOL SUCCINATE ER 50 MG PO TB24
50.0000 mg | ORAL_TABLET | Freq: Once | ORAL | Status: AC
  Administered 2017-03-19: 50 mg via ORAL
  Filled 2017-03-19: qty 1

## 2017-03-19 MED ORDER — OXYCODONE HCL 5 MG PO TABS
5.0000 mg | ORAL_TABLET | ORAL | Status: DC | PRN
  Administered 2017-03-19: 5 mg via ORAL

## 2017-03-19 MED ORDER — ONDANSETRON HCL 4 MG/2ML IJ SOLN
INTRAMUSCULAR | Status: AC
  Filled 2017-03-19: qty 2

## 2017-03-19 MED ORDER — MORPHINE SULFATE (PF) 4 MG/ML IV SOLN: 1.0000 mg | INTRAVENOUS | Status: DC | PRN

## 2017-03-19 MED ORDER — SODIUM CHLORIDE 0.9% FLUSH: 3.0000 mL | Freq: Two times a day (BID) | INTRAVENOUS | Status: DC

## 2017-03-19 SURGICAL SUPPLY — 45 items
APPLIER CLIP LOGIC TI 5 (MISCELLANEOUS) IMPLANT
BANDAGE ADH SHEER 1  50/CT (GAUZE/BANDAGES/DRESSINGS) ×3 IMPLANT
BENZOIN TINCTURE PRP APPL 2/3 (GAUZE/BANDAGES/DRESSINGS) ×3 IMPLANT
BINDER ABDOMINAL 12 ML 46-62 (SOFTGOODS) ×3 IMPLANT
CABLE HIGH FREQUENCY MONO STRZ (ELECTRODE) ×3 IMPLANT
CHLORAPREP W/TINT 26ML (MISCELLANEOUS) ×3 IMPLANT
COVER SURGICAL LIGHT HANDLE (MISCELLANEOUS) ×3 IMPLANT
DECANTER SPIKE VIAL GLASS SM (MISCELLANEOUS) IMPLANT
DERMABOND ADVANCED (GAUZE/BANDAGES/DRESSINGS) ×2
DERMABOND ADVANCED .7 DNX12 (GAUZE/BANDAGES/DRESSINGS) ×1 IMPLANT
DEVICE SECURE STRAP 25 ABSORB (INSTRUMENTS) ×3 IMPLANT
DEVICE TROCAR PUNCTURE CLOSURE (ENDOMECHANICALS) ×3 IMPLANT
DRAIN CHANNEL 19F RND (DRAIN) IMPLANT
DRAPE INCISE IOBAN 66X45 STRL (DRAPES) IMPLANT
DRAPE UTILITY XL STRL (DRAPES) IMPLANT
DRSG TEGADERM 4X4.75 (GAUZE/BANDAGES/DRESSINGS) ×3 IMPLANT
ELECT PENCIL ROCKER SW 15FT (MISCELLANEOUS) ×3 IMPLANT
ELECT REM PT RETURN 15FT ADLT (MISCELLANEOUS) ×3 IMPLANT
EVACUATOR SILICONE 100CC (DRAIN) IMPLANT
GAUZE SPONGE 2X2 8PLY STRL LF (GAUZE/BANDAGES/DRESSINGS) ×1 IMPLANT
GOWN STRL REUS W/TWL XL LVL3 (GOWN DISPOSABLE) ×12 IMPLANT
KIT BASIN OR (CUSTOM PROCEDURE TRAY) ×3 IMPLANT
L-HOOK LAP DISP 36CM (ELECTROSURGICAL)
LHOOK LAP DISP 36CM (ELECTROSURGICAL) IMPLANT
MARKER SKIN DUAL TIP RULER LAB (MISCELLANEOUS) ×3 IMPLANT
MESH VENTRALIGHT ST 4X6IN (Mesh General) ×3 IMPLANT
NEEDLE SPNL 22GX3.5 QUINCKE BK (NEEDLE) ×3 IMPLANT
PAD POSITIONING PINK XL (MISCELLANEOUS) IMPLANT
SCISSORS LAP 5X35 DISP (ENDOMECHANICALS) ×3 IMPLANT
SET IRRIG TUBING LAPAROSCOPIC (IRRIGATION / IRRIGATOR) IMPLANT
SHEARS HARMONIC ACE PLUS 36CM (ENDOMECHANICALS) IMPLANT
SLEEVE XCEL OPT CAN 5 100 (ENDOMECHANICALS) ×3 IMPLANT
SPONGE GAUZE 2X2 STER 10/PKG (GAUZE/BANDAGES/DRESSINGS) ×2
SUT ETHILON 2 0 PS N (SUTURE) IMPLANT
SUT MNCRL AB 4-0 PS2 18 (SUTURE) ×3 IMPLANT
SUT NOVA NAB GS-21 0 18 T12 DT (SUTURE) ×9 IMPLANT
SUT VIC AB 3-0 SH 18 (SUTURE) ×3 IMPLANT
TOWEL OR 17X26 10 PK STRL BLUE (TOWEL DISPOSABLE) ×3 IMPLANT
TOWEL OR NON WOVEN STRL DISP B (DISPOSABLE) ×3 IMPLANT
TRAY FOLEY W/METER SILVER 14FR (SET/KITS/TRAYS/PACK) IMPLANT
TRAY FOLEY W/METER SILVER 16FR (SET/KITS/TRAYS/PACK) IMPLANT
TRAY LAPAROSCOPIC (CUSTOM PROCEDURE TRAY) ×3 IMPLANT
TROCAR BLADELESS OPT 5 100 (ENDOMECHANICALS) ×3 IMPLANT
TROCAR XCEL NON-BLD 11X100MML (ENDOMECHANICALS) IMPLANT
TUBING INSUF HEATED (TUBING) ×3 IMPLANT

## 2017-03-19 NOTE — H&P (Signed)
Jesse Ho is an 41 y.o. male.   Chief Complaint: here for surgery HPI:  41 yo male comes for supraumbilical hernia repair. Denies any changes since initially seen in clinic except for another trip to ED for periumbilical pain. We received clearance from cardiology.   The patient is a 41 year old male who presents with an abdominal wall hernia. He is referred by Dr Nadine Counts for evaluation of a supraumbilical hernia. He reports that a few weeks ago he felt a pain in his stomach area. He noticed a lump above his belly button. It got progressively larger during the daytime. He contacted his PCPs office and was then sent to the emergency room. The hernia spontaneously reduced itself after an hour or so in the emergency room. He did not undergo any CT imaging. He states he has not had a severe episode like that since leaving the ER however the area still a little bit uncomfortable and he still has a full sensation. He also reports that his bowel movements have become more irregular. He has been having more constipation. He denies any nausea or vomiting or fever or chills. He denies any prior abdominal surgery.  He was admitted to the hospital in May with a questionable TIA. He had left upper and lower extremity weakness and spasms. His stroke workup was for the most part negative. MRI and MRA were negative. He is undergoing outpatient rehabilitation. He is also undergoing evaluation by his cardiologist  Past Medical History:  Diagnosis Date  . Arthritis    played foot ball  . Cervical dystonia   . CVA (cerebral vascular accident) (HCC)    mini stroke  . Hyperlipidemia   . Hypertension     Past Surgical History:  Procedure Laterality Date  . CYSTECTOMY Left 10/19/2014   left branchial cleft cyst removed  . FRACTURE SURGERY     fx fingers on left hand playing football   and bil L wrist   . TEE WITHOUT CARDIOVERSION N/A 10/27/2016   Procedure: TRANSESOPHAGEAL ECHOCARDIOGRAM  (TEE);  Surgeon: Yates Decamp, MD;  Location: [facility] ENDOSCOPY;  Service: Cardiovascular;  Laterality: N/A;    Family History  Problem Relation Age of Onset  . Hyperlipidemia Other   . Hypertension Other   . Diabetes Other   . Obesity Other   . Diabetes Sister    Social History:  reports that he has never smoked. He has never used smokeless tobacco. He reports that he drinks alcohol. He reports that he does not use drugs.  Allergies:  Allergies  Allergen Reactions  . Keflex [Cephalexin] Anaphylaxis    Medications Prior to Admission  Medication Sig Dispense Refill  . aspirin EC 81 MG tablet Take 1 tablet (81 mg total) by mouth daily. 30 tablet 2  . atorvastatin (LIPITOR) 10 MG tablet Take 10 mg by mouth at bedtime.   1  . docusate sodium (COLACE) 100 MG capsule Take 1 capsule (100 mg total) by mouth daily as needed for mild constipation or moderate constipation. 30 capsule 0  . eplerenone (INSPRA) 25 MG tablet Take 25 mg by mouth every morning.  2  . HYDROcodone-acetaminophen (NORCO) 5-325 MG tablet Take 1 tablet by mouth every 6 (six) hours as needed for severe pain. 8 tablet 0  . metoprolol succinate (TOPROL-XL) 50 MG 24 hr tablet Take 50 mg by mouth daily.  1  . olmesartan-hydrochlorothiazide (BENICAR HCT) 40-12.5 MG tablet Take 1 tablet by mouth daily.    . polyethylene glycol (  MIRALAX) packet Take 17 g by mouth daily. 14 each 0  . psyllium (METAMUCIL SMOOTH TEXTURE) 28 % packet Take 1 packet by mouth 2 (two) times daily. 30 packet 0  . verapamil (VERELAN PM) 180 MG 24 hr capsule Take 180 mg by mouth daily.  3  . ibuprofen (ADVIL,MOTRIN) 200 MG tablet Take 200 mg by mouth every 6 (six) hours as needed for headache (pain).    . nitroGLYCERIN (NITROSTAT) 0.4 MG SL tablet Place 1 tablet under the tongue every 5 (five) minutes as needed for chest pain.   1  . triamterene-hydrochlorothiazide (DYAZIDE) 37.5-25 MG capsule Take 1 each (1 capsule total) by mouth daily. (Patient not taking:  Reported on 11/13/2016) 30 capsule 11    No results found for this or any previous visit (from the past 48 hour(s)). No results found.  Review of Systems  Constitutional: Negative for weight loss.  HENT: Negative for nosebleeds.   Eyes: Negative for blurred vision.  Respiratory: Negative for shortness of breath.   Cardiovascular: Negative for chest pain, palpitations, orthopnea and PND.       Denies DOE  Genitourinary: Negative for dysuria and hematuria.  Musculoskeletal: Negative.   Skin: Negative for itching and rash.  Neurological: Negative for dizziness, focal weakness, seizures, loss of consciousness and headaches.       Denies TIAs, amaurosis fugax  Endo/Heme/Allergies: Does not bruise/bleed easily.  Psychiatric/Behavioral: The patient is not nervous/anxious.     Blood pressure (!) 147/107, pulse 74, temperature 97.9 F (36.6 C), temperature source Oral, resp. rate 16, height  (1.702 m), weight 88.5 kg (195 lb), SpO2 100 %. Physical Exam  Vitals reviewed. Constitutional: He is oriented to person, place, and time. He appears well-developed and well-nourished. No distress.  HENT:  Head: Normocephalic and atraumatic.  Right Ear: External ear normal.  Left Ear: External ear normal.  Eyes: Conjunctivae are normal. No scleral icterus.  Neck: Normal range of motion. Neck supple. No tracheal deviation present. No thyromegaly present.  Cardiovascular: Normal rate and normal heart sounds.   Respiratory: Effort normal and breath sounds normal. No stridor. No respiratory distress. He has no wheezes.  GI: Soft. There is no tenderness. There is no rebound and no guarding.  Small umbilical hernia along with supraumbilical hernia 1.5 x 1 cm  Musculoskeletal: He exhibits no edema or tenderness.  Lymphadenopathy:    He has no cervical adenopathy.  Neurological: He is alert and oriented to person, place, and time. He exhibits normal muscle tone.  Skin: Skin is warm and dry. No rash  noted. He is not diaphoretic. No erythema. No pallor.  Psychiatric: He has a normal mood and affect. His behavior is normal. Judgment and thought content normal.     Assessment/Plan Umbilical/supraumbilical hernia HTN ? H/o TIA  ERAS meds To OR for lap assisted hernia repair.  scds  Mary Sella. Andrey Campanile, MD, FACS General, Bariatric, & Minimally Invasive Surgery [facility] Surgery, [place]   Atilano Ina, MD 03/19/2017, 7:28 AM

## 2017-03-19 NOTE — Progress Notes (Signed)
Wife is at the bedside at this time. Vs and orders assessed and will continue to monitor and tx pt according to MD orders.

## 2017-03-19 NOTE — Progress Notes (Signed)
Mother is at the bedside at this time. Pt is alert, calm, and talkative and has no s/s of distress. VS and orders assessed and will continue to monitor and tx pt according to MD orders.

## 2017-03-19 NOTE — Progress Notes (Signed)
Radiology is at the bedside at this time.

## 2017-03-19 NOTE — Progress Notes (Signed)
Paged Dr. Andrey Campanile for orders.

## 2017-03-19 NOTE — Op Note (Signed)
Jesse Ho [id number] 1976-01-30 03/19/2017   Laparoscopic Assisted Repair of Umbilical and Supraumbilical Hernia Repair with mesh Procedure Note  Indications: Symptomatic supraumbilical hernia  Pre-operative Diagnosis: supraumbilical hernia  Post-operative Diagnosis: supraumbilical and umbilical hernia  Surgeon: Atilano Ina   Anesthesia: General endotracheal anesthesia  Procedure Details  The patient was seen in the Holding Room. The risks, benefits, complications, treatment options, and expected outcomes were discussed with the patient. The possibilities of reaction to medication, pulmonary aspiration, perforation of viscus, bleeding, recurrent infection, the need for additional procedures, failure to diagnose a condition, and creating a complication requiring transfusion or operation were discussed with the patient. The patient concurred with the proposed plan, giving informed consent.  The site of surgery properly noted/marked. The patient was taken to the operating room, identified as Jesse Ho and the procedure verified as laparoscopic assited supraumbilical hernia repair with mesh. A Time Out was held and the above information confirmed.  He received ERAS medications preoperatively.    The patient was placed supine. After establishing general anesthesia, the abdomen was prepped and draped in standard fashion. 0.250% Marcaine with epinephrine was used to anesthetize the skin. A curvilinear infraumbilical incision was created. Dissection was carried down to the small umbilical hernia fascial defect. Intact fascia was identified circumferentially around the defect.  There was also a palpable supraumbilical fascia bulge about a inch above the umbilical defect. The herniated fat was unable to be reduce so I excised it. This left about a 0.5 cm defect. The umbilical fascial defect is about 2 cm x 1.5 cm. At this point I went laparoscopic.    A 5 mm Optiview was used the cannulate the  peritoneal cavity in the left upper quadrant below the costal margin.  Pneumoperitoneum was obtained by insufflating CO2, maintaining a maximum pressure of 15 mmHg.  The 5 mm 30-degree laparoscopic was inserted.  There were no significant omental adhesions to the anterior abdominal wall in and around the 2 hernia defects.  .  Another 5-mm port was placed in the left lower quadrant.  Endoshears with cautery was used to take down some of the falciform ligament bc of it's proximity to the supraumbilical defect.   We inspected the entire abdominal wall and were able to visualize 2 fascial defects. We used a spinal needle to identify the extent of the hernia defects.  This covered an area of about 5 cms.  We selected a oval piece 10 x 15 cm piece of Bard VentralightST mesh.  We placed 4 stay sutures of 0 Novofil around the edges of the mesh.  The mesh was then rolled up and inserted through the umbilical fascial defect.  The umbilical fascia was then closed primarily with 5 interrupted 1-0-novafil sutures. The supraumbilical fascial defect was closed with a single interrupted 1-0 novafil suture. I returned laparoscopically.  I infiltrated the preperitonal space around the fascial defects and anticipated tack/transfascial sutures in the abdominal wall with exparel. I placed an additional 1-0 novafil suture transfascially using the endoclose device for the supraumbilical defect.   The mesh was then unrolled.  The 4 stay sutures were then pulled up through small stab incisions using the Endo-close device.  This deployed the mesh widely over the fascial defects.  The stay sutures were then tied down.  The Secure Strap device was then used to tack down the edges of the mesh at 1 cm intervals circumferentially. We placed an additional 5 mm port site on the right to allow  proper placement of the tacks. We placed a few tacks inside the outer ring of tacks.  We inspected for hemostasis.   Pneumoperitoneum was then released as  we removed the remainder of the trocars. The umbilical stalk was then tacked back down to the fascia with two 3-0 vicryl sutures. Hemostasis was confirmed. The soft tissue was irrigated and closed in layers with inverted interrupted 3-0 vicryl sutures for the deep dermis. The skin incision was closed with a 4-0 monocryl subcuticular closure. Steri-Strips followed by a 4x4 gauze and a tegaderm were applied at the end of the operation.    The port sites were closed with 4-0 Monocryl followed by benzoin, steri-strips, and bandages.  An abdominal binder was placed around the patient's abdomen.  The patient was extubated and brought to the recovery room in stable condition.  All sponge, instrument, and needle counts were correct prior to closure and at the conclusion of the case.   Findings: Type of repair - primary suture with mesh underlay  (choices - primary suture, mesh, or component)  Name of mesh - bard ventralightST   Size of mesh - Length 15 cm, Width 10 cm  Mesh overlap - 5 cm  Placement of mesh - beneath fascia and into peritoneal cavity  (choices - beneath fascia and into peritoneal cavity, beneath fascia but external to peritoneal cavity, between the muscle and fascia, above or external to fascia)  Estimated Blood Loss:  Minimal         Complications:  None; patient tolerated the procedure well.         Disposition: PACU - hemodynamically stable.         Condition: stable  Mary Sella. Andrey Campanile, MD, FACS General, Bariatric, & Minimally Invasive Surgery [facility], [place]

## 2017-03-19 NOTE — Anesthesia Procedure Notes (Signed)
Procedure Name: Intubation Date/Time: 03/19/2017 7:41 AM Performed by: Vanessa Beaverhead Pre-anesthesia Checklist: Patient identified, Emergency Drugs available, Suction available and Patient being monitored Patient Re-evaluated:Patient Re-evaluated prior to induction Oxygen Delivery Method: Circle system utilized Preoxygenation: Pre-oxygenation with 100% oxygen Induction Type: IV induction Ventilation: Mask ventilation without difficulty Laryngoscope Size: 2 and Miller Grade View: Grade I Tube type: Oral Tube size: 7.5 mm Number of attempts: 1 Airway Equipment and Method: Stylet Placement Confirmation: ETT inserted through vocal cords under direct vision,  positive ETCO2 and breath sounds checked- equal and bilateral Secured at: 22 cm Tube secured with: Tape Dental Injury: Teeth and Oropharynx as per pre-operative assessment

## 2017-03-19 NOTE — Progress Notes (Signed)
Dr. Mal Amabile at the bedside at this time for assessment. Pt is easily aroused and talking. VS and orders assessed and will continue to monitor and tx pt according to MD orders.

## 2017-03-19 NOTE — Progress Notes (Signed)
Jesse Ho 12/24/2016 2:13 PM Location: [facility] Patient #: [id number] DOB: [birth year] Married / Language: English / Race: Black or African American Male   History of Present Illness Jesse Areola M. Neftali Thurow MD; 12/24/2016 2:45 PM) The patient is a 41 year old male who presents with an abdominal wall hernia. He is referred by Dr Jesse Ho for evaluation of a supraumbilical hernia. He reports that a few weeks ago he felt a pain in his stomach area. He noticed a lump above his belly button. It got progressively larger during the daytime. He contacted his PCPs office and was then sent to the emergency room. The hernia spontaneously reduced itself after an hour or so in the emergency room. He did not undergo any CT imaging. He states he has not had a severe episode like that since leaving the ER however the area still a little bit uncomfortable and he still has a full sensation. He also reports that his bowel movements have become more irregular. He has been having more constipation. He denies any nausea or vomiting or fever or chills. He denies any prior abdominal surgery.  He was admitted to the hospital in May with a questionable TIA. He had left upper and lower extremity weakness and spasms. His stroke workup was for the most part negative. MRI and MRA were negative. He is undergoing outpatient rehabilitation. He is also undergoing evaluation by his cardiologist   Problem List/Past Medical Jesse Areola M. Jesse Campanile, MD; 12/24/2016 2:46 PM) SUPRAUMBILICAL HERNIA (K43.9)  LEFT-SIDED WEAKNESS (R53.1)  HYPERTENSION, ESSENTIAL (I10)   Past Surgical History Jesse Ho, RMA; 12/24/2016 2:13 PM) No pertinent past surgical history   Diagnostic Studies History Jesse Ho, RMA; 12/24/2016 2:13 PM) Colonoscopy  never  Allergies Jesse Ho, RMA; 12/24/2016 2:14 PM) Keflex *CEPHALOSPORINS*  stops breathing  Medication History Jesse Ho, RMA; 12/24/2016 2:15  PM) Hydrocodone-Acetaminophen (5-325MG  Tablet, Oral) Active. Aspirin Low Dose (  Tablet DR, Oral) Active. Atorvastatin Calcium (  Tablet, Oral) Active. DOK (  Capsule, Oral) Active. Triamterene-HCTZ (37.5-25MG  Capsule, Oral) Active. Polyethylene Glycol 3350 (Oral) Active. Verapamil HCl ER (  Capsule ER 24HR, Oral) Active. Medications Reconciled  Social History Jesse Ho, [place]; 12/24/2016 2:13 PM) Alcohol use  Occasional alcohol use. Caffeine use  Coffee, Tea. No drug use  Tobacco use  Never smoker.  Family History Jesse Ho, [place]; 12/24/2016 2:13 PM) Family history unknown  First Degree Relatives   Other Problems Jesse Areola M. Jesse Campanile, MD; 12/24/2016 2:46 PM) Cerebrovascular Accident  Chest pain     Review of Systems Jesse Ho RMA; 12/24/2016 2:13 PM) Skin Not Present- Change in Wart/Mole, Dryness, Hives, Jaundice, New Lesions, Non-Healing Wounds, Rash and Ulcer. HEENT Not Present- Earache, Hearing Loss, Hoarseness, Nose Bleed, Oral Ulcers, Ringing in the Ears, Seasonal Allergies, Sinus Pain, Sore Throat, Visual Disturbances, Wears glasses/contact lenses and Yellow Eyes. Respiratory Not Present- Bloody sputum, Chronic Cough, Difficulty Breathing, Snoring and Wheezing. Breast Not Present- Breast Mass, Breast Pain, Nipple Discharge and Skin Changes. Cardiovascular Present- Leg Cramps. Not Present- Chest Pain, Difficulty Breathing Lying Down, Palpitations, Rapid Heart Rate, Shortness of Breath and Swelling of Extremities. Gastrointestinal Present- Abdominal Pain and Constipation. Not Present- Bloating, Bloody Stool, Change in Bowel Habits, Chronic diarrhea, Difficulty Swallowing, Excessive gas, Gets full quickly at meals, Hemorrhoids, Indigestion, Nausea, Rectal Pain and Vomiting. Male Genitourinary Present- Blood in Urine. Not Present- Change in Urinary Stream, Frequency, Impotence, Nocturia, Painful Urination, Urgency and Urine  Leakage. Psychiatric Not Present- Anxiety, Bipolar, Change in Sleep Pattern, Depression, Fearful and Frequent crying. Endocrine  Not Present- Cold Intolerance, Excessive Hunger, Hair Changes, Heat Intolerance, Hot flashes and New Diabetes. Hematology Not Present- Blood Thinners, Easy Bruising, Excessive bleeding, Gland problems, HIV and Persistent Infections.  Vitals Jesse Ho RMA; 12/24/2016 2:15 PM) 12/24/2016 2:15 PM Weight: 190.6 lb Height: 66in Body Surface Area: 1.96 m Body Mass Index: 30.76 kg/m  Temp.: 97.6F  Pulse: 68 (Regular)  BP: 160/100 (Sitting, Left Arm, Standard)       Physical Exam Jesse Areola M. Adorian Gwynne MD; 12/24/2016 2:43 PM) General Mental Status-Alert. General Appearance-Cooperative. Orientation-Oriented X4. Posture-Normal posture.  Integumentary Global Assessment Upon inspection and palpation of skin surfaces of the - Head/Face: no rashes, ulcers, lesions or evidence of photo damage. No palpable nodules or masses and Neck: no visible lesions or palpable masses.  Head and Neck Head-normocephalic, atraumatic with no lesions or palpable masses. Face Global Assessment - atraumatic. Thyroid Gland Characteristics - normal size and consistency.  Eye Eyeball - Bilateral-Extraocular movements intact. Sclera/Conjunctiva - Bilateral-No scleral icterus, No Discharge.  ENMT Nose and Sinuses External Inspection of the Nose - no deformities observed, no swelling present.  Chest and Lung Exam Palpation Palpation of the chest reveals - Non-tender. Auscultation Breath sounds - Normal.  Cardiovascular Auscultation Rhythm - Regular. Heart Sounds - S1 WNL and S2 WNL. Carotid arteries - No Carotid bruit.  Abdomen Inspection Inspection of the abdomen reveals - No Visible peristalsis, No Abnormal pulsations and No Paradoxical movements. Palpation/Percussion Palpation and Percussion of the abdomen reveal - Soft, Non Tender, No Rebound  tenderness, No Rigidity (guarding), No hepatosplenomegaly and No Palpable abdominal masses. Note: Patient examined supine and standing with and without Valsalva maneuvers. He has a supraumbilical hernia. Reducible. Not terribly tender. Defect is probably about 1.5 x 1.5 cm   Peripheral Vascular Upper Extremity Palpation - Pulses bilaterally normal. Lower Extremity Palpation - Edema - Bilateral - No edema.  Neurologic Neurologic evaluation reveals -normal sensation and normal coordination.  Neuropsychiatric Mental status exam performed with findings of-able to articulate well with normal speech/language, rate, volume and coherence and thought content normal with ability to perform basic computations and apply abstract reasoning.  Musculoskeletal Normal Exam - Bilateral-Upper Extremity Weakness, Lower Extremity Weakness. Note: Right-sided upper and lower extremity is 5 out of 5; he does have left upper extremity and left lower extremity weakness about a 3+    Assessment & Plan Jesse Areola M. Emmagrace Runkel MD; 12/24/2016 2:46 PM) SUPRAUMBILICAL HERNIA (K43.9) Impression: We discussed the etiology of ventral hernias. We discussed the signs and symptoms of incarceration and strangulation. The patient was given educational material. I also drew diagrams.  We discussed nonoperative and operative management. With respect to operative management, we discussed both open repair and laparoscopic repair. We discussed the pros and cons of each approach. I discussed the typical aftercare with each procedure and how each procedure differs. I recommended a laparoscopic-assisted approach.  The patient has elected to proceed with laparoscopic-assisted repair of his supraumbilical hernia with mesh once we obtain cardiac and neurological clearance  We discussed the risk and benefits of surgery including but not limited to bleeding, infection, injury to surrounding structures, hernia recurrence, mesh complications,  hematoma/seroma formation, need to convert to an open procedure, blood clot formation, urinary retention, post operative ileus, general anesthesia risk, long-term abdominal pain. We discussed that this procedure can be quite uncomfortable and difficult to recover from based on how the mesh is secured to the abdominal wall. We discussed the importance of avoiding heavy lifting and straining for a period of 6  weeks. Current Plans I sent in Rx for flomax to help decrease risk of your bladder going to sleep during surgery (urinary retention). please pick up prescription and start taking  I recommended obtaining preoperative cardiac clearance. I am concerned about the health of the patient and the ability to tolerate the operation. Therefore, we will request clearance by cardiology to better assess operative risk & see if a reevaluation, further workup, etc is needed. Also recommendations on how medications such as for anticoagulation and blood pressure should be managed/held/restarted after surgery.  Pt Education - Pamphlet Given - Hernia Surgery: discussed with patient and provided information. LEFT-SIDED WEAKNESS (R53.1) Impression: It appears his stroke workup was negative. Nonetheless we will ask his neurologist for neurological clearance for surgery HYPERTENSION, ESSENTIAL (I10)   Mary Sella. Jesse Campanile, MD, FACS General, Bariatric, & Minimally Invasive Surgery [facility], [place]

## 2017-03-19 NOTE — Addendum Note (Signed)
Addendum  created 03/19/17 1119 by Beryle Lathe, MD   Sign clinical note

## 2017-03-19 NOTE — Discharge Instructions (Signed)
[facility]  UMBILICAL OR INGUINAL HERNIA REPAIR: POST OP INSTRUCTIONS  Always review your discharge instruction sheet given to you by the facility where your surgery was performed. IF YOU HAVE DISABILITY OR FAMILY LEAVE FORMS, YOU MUST BRING THEM TO THE OFFICE FOR PROCESSING.   DO NOT GIVE THEM TO YOUR DOCTOR.  1. Pain Control: Taking oxycodone alone will not be enough for pain control. Follow the regimen below                 - Take acetaminophen (Tylenol)  by mouth every 4-6 hrs                 - Take ibuprofen (advil) every 8 hrs - follow package directions.                  - Take gabapentin (neurontin) twice a day - pick up prescription from pharmacy.  Read pharmacy instructions/warnings.                  - if still having pain, then you may take your prescription narcotic pain medication - oxycodone - as needed.  2. Take your usually prescribed medications unless otherwise directed. 3. If you need a refill on your pain medication, please contact your pharmacy.  They will contact our office to request authorization. Prescriptions will not be filled after 5 pm or on week-ends. 4. You should follow a light diet the first 24 hours after arrival home, such as soup and crackers, etc.  Be sure to include lots of fluids daily.  Resume your normal diet the day after surgery. 5. Most patients will experience some swelling and bruising around the umbilicus or in the groin and scrotum.  Ice packs and reclining will help.  Swelling and bruising can take several days to resolve.  6. It is common to experience some constipation if taking pain medication after surgery.  Increasing fluid intake and taking a stool softener (such as Colace) will usually help or prevent this problem from occurring.  A mild laxative (Milk of Magnesia or Miralax) should be taken according to package directions if there are no bowel movements after 48 hours. 7. Unless discharge instructions indicate  otherwise, you may remove your bandages 48 hours after surgery, and you may shower at that time.  You may have steri-strips (small skin tapes) in place directly over the incision.  These strips should be left on the skin for 7-10 days.  8. ACTIVITIES:  You may resume regular (light) daily activities beginning the next day--such as daily self-care, walking, climbing stairs--gradually increasing activities as tolerated.  You may have sexual intercourse when it is comfortable.  Refrain from any heavy lifting or straining until approved by your doctor. a. You may drive when you are no longer taking prescription pain medication, you can comfortably wear a seatbelt, and you can safely maneuver your car and apply brakes. b. RETURN TO WORK:  9. You should see your doctor in the office for a follow-up appointment approximately 2-3 weeks after your surgery.  Make sure that you call for this appointment within a day or two after you arrive home to insure a convenient appointment time. 10. OTHER INSTRUCTIONS: DO NOT LIFT, PUSH, OR PULL ANYTHING GREATER THAN 10 LBS FOR 6 WEEKS    WHEN TO CALL YOUR DOCTOR: 1. Fever over 101.0 2. Inability to urinate 3. Nausea and/or vomiting 4. Extreme swelling or bruising 5. Continued bleeding from incision. 6. Increased  pain, redness, or drainage from the incision  The clinic staff is available to answer your questions during regular business hours.  Please dont hesitate to call and ask to speak to one of the nurses for clinical concerns.  If you have a medical emergency, go to the nearest emergency room or call 911.  A surgeon from [facility] is always on call at the hospital   [address], [place], [place]  [address] ?  [address] [address], [place], [place]   [address] [phone] ? [phone] ? FAX [phone] Web site: [email or web address]   General Anesthesia, Adult, Care After These instructions provide you with  information about caring for yourself after your procedure. Your health care provider may also give you more specific instructions. Your treatment has been planned according to current medical practices, but problems sometimes occur. Call your health care provider if you have any problems or questions after your procedure. What can I expect after the procedure? After the procedure, it is common to have:  Vomiting.  A sore throat.  Mental slowness.  It is common to feel:  Nauseous.  Cold or shivery.  Sleepy.  Tired.  Sore or achy, even in parts of your body where you did not have surgery.  Follow these instructions at home: For at least 24 hours after the procedure:  Do not: ? Participate in activities where you could fall or become injured. ? Drive. ? Use heavy machinery. ? Drink alcohol. ? Take sleeping pills or medicines that cause drowsiness. ? Make important decisions or sign legal documents. ? Take care of children on your own.  Rest. Eating and drinking  If you vomit, drink water, juice, or soup when you can drink without vomiting.  Drink enough fluid to keep your urine clear or pale yellow.  Make sure you have little or no nausea before eating solid foods.  Follow the diet recommended by your health care provider. General instructions  Have a responsible adult stay with you until you are awake and alert.  Return to your normal activities as told by your health care provider. Ask your health care provider what activities are safe for you.  Take over-the-counter and prescription medicines only as told by your health care provider.  If you smoke, do not smoke without supervision.  Keep all follow-up visits as told by your health care provider. This is important. Contact a health care provider if:  You continue to have nausea or vomiting at home, and medicines are not helpful.  You cannot drink fluids or start eating again.  You cannot urinate after  8-12 hours.  You develop a skin rash.  You have fever.  You have increasing redness at the site of your procedure. Get help right away if:  You have difficulty breathing.  You have chest pain.  You have unexpected bleeding.  You feel that you are having a life-threatening or urgent problem. This information is not intended to replace advice given to you by your health care provider. Make sure you discuss any questions you have with your health care provider. Document Released: 08/31/2000 Document Revised: 10/28/2015 Document Reviewed: 05/09/2015 Elsevier Interactive Patient Education  [employer].

## 2017-03-19 NOTE — Anesthesia Postprocedure Evaluation (Addendum)
Anesthesia Post Note  Patient: Jesse Ho  Procedure(s) Performed: LAPAROSCOPIC SUPRA UMBILICAL AND UMBILICAL HERNIA REPAIR WITH MESH (N/A Abdomen) INSERTION OF MESH (N/A Abdomen)     Patient location during evaluation: PACU Anesthesia Type: General Level of consciousness: awake and alert Pain management: pain level controlled Vital Signs Assessment: post-procedure vital signs reviewed and stable Respiratory status: spontaneous breathing, nonlabored ventilation, respiratory function stable and patient connected to face mask oxygen Cardiovascular status: blood pressure returned to baseline and stable Postop Assessment: no apparent nausea or vomiting Anesthetic complications: no Comments: Patient with some respiratory issues in the immediate postop period. Requiring NRBM to maintain saturations in low 90's. Patient subjectively SOB, denies any pain with respiration. Lungs diminished b/l. CXR ordered, appears underinflated but clear lung fields. Abdominal binder loosened slightly, patient able to inspire more deeply, saturations improving.    Last Vitals:  Vitals:   03/19/17 1000 03/19/17 1015  BP: (!) 133/92   Pulse: (!) 59 60  Resp: (!) 23 (!) 23  Temp:    SpO2: 94% 93%    Last Pain:  Vitals:   03/19/17 1015  TempSrc:   PainSc: 4                  Beryle Lathe

## 2017-03-19 NOTE — Progress Notes (Signed)
Pt is sitting up in the recliner at this time and is tolerating it well. Pt is alert, calm, and talkative and has no s/s of distress. VS and orders assessed and will continue to monitor and tx pt according to Md orders.

## 2017-03-19 NOTE — Transfer of Care (Signed)
Immediate Anesthesia Transfer of Care Note  Patient: Jesse Ho  Procedure(s) Performed: Procedure(s): LAPAROSCOPIC SUPRA UMBILICAL AND UMBILICAL HERNIA REPAIR WITH MESH (N/A) INSERTION OF MESH (N/A)  Patient Location: PACU  Anesthesia Type:General  Level of Consciousness:  sedated, patient cooperative and responds to stimulation  Airway & Oxygen Therapy:Patient Spontanous Breathing and Patient connected to face mask oxgen  Post-op Assessment:  Report given to PACU RN and Post -op Vital signs reviewed and stable  Post vital signs:  Reviewed and stable  Last Vitals:  Vitals:   03/19/17 0539  BP: (!) 147/107  Pulse: 74  Resp: 16  Temp: 36.6 C  SpO2: 100%    Complications: No apparent anesthesia complications

## 2017-03-19 NOTE — Progress Notes (Signed)
Incentive Spirometer given to pt at this time. Pt encouraged to use it with teaching and coaching, Pt is alert, calm and has no s/s of distress. VS and orders assessed and will continue to monitor and tx pt according to Md orders.

## 2017-03-22 ENCOUNTER — Encounter (HOSPITAL_COMMUNITY): Payer: Self-pay | Admitting: General Surgery

## 2017-03-24 DIAGNOSIS — K469 Unspecified abdominal hernia without obstruction or gangrene: Secondary | ICD-10-CM | POA: Diagnosis not present

## 2017-03-24 DIAGNOSIS — Z8673 Personal history of transient ischemic attack (TIA), and cerebral infarction without residual deficits: Secondary | ICD-10-CM | POA: Diagnosis not present

## 2017-03-24 DIAGNOSIS — I10 Essential (primary) hypertension: Secondary | ICD-10-CM | POA: Diagnosis not present

## 2017-03-24 DIAGNOSIS — I209 Angina pectoris, unspecified: Secondary | ICD-10-CM | POA: Diagnosis not present

## 2017-05-05 DIAGNOSIS — I10 Essential (primary) hypertension: Secondary | ICD-10-CM | POA: Diagnosis not present

## 2017-05-05 DIAGNOSIS — Z0189 Encounter for other specified special examinations: Secondary | ICD-10-CM | POA: Diagnosis not present

## 2017-05-05 DIAGNOSIS — Z8673 Personal history of transient ischemic attack (TIA), and cerebral infarction without residual deficits: Secondary | ICD-10-CM | POA: Diagnosis not present

## 2017-07-02 ENCOUNTER — Other Ambulatory Visit: Payer: Self-pay | Admitting: General Surgery

## 2017-07-02 DIAGNOSIS — Z09 Encounter for follow-up examination after completed treatment for conditions other than malignant neoplasm: Secondary | ICD-10-CM

## 2017-07-07 IMAGING — DX DG ABDOMEN ACUTE W/ 1V CHEST
3 series · 3 of 3 positions shown · non-contrast
Comparison: none

CLINICAL DATA: Chest pain .

EXAM:
DG ABDOMEN ACUTE W/ 1V CHEST

[w chest pa]
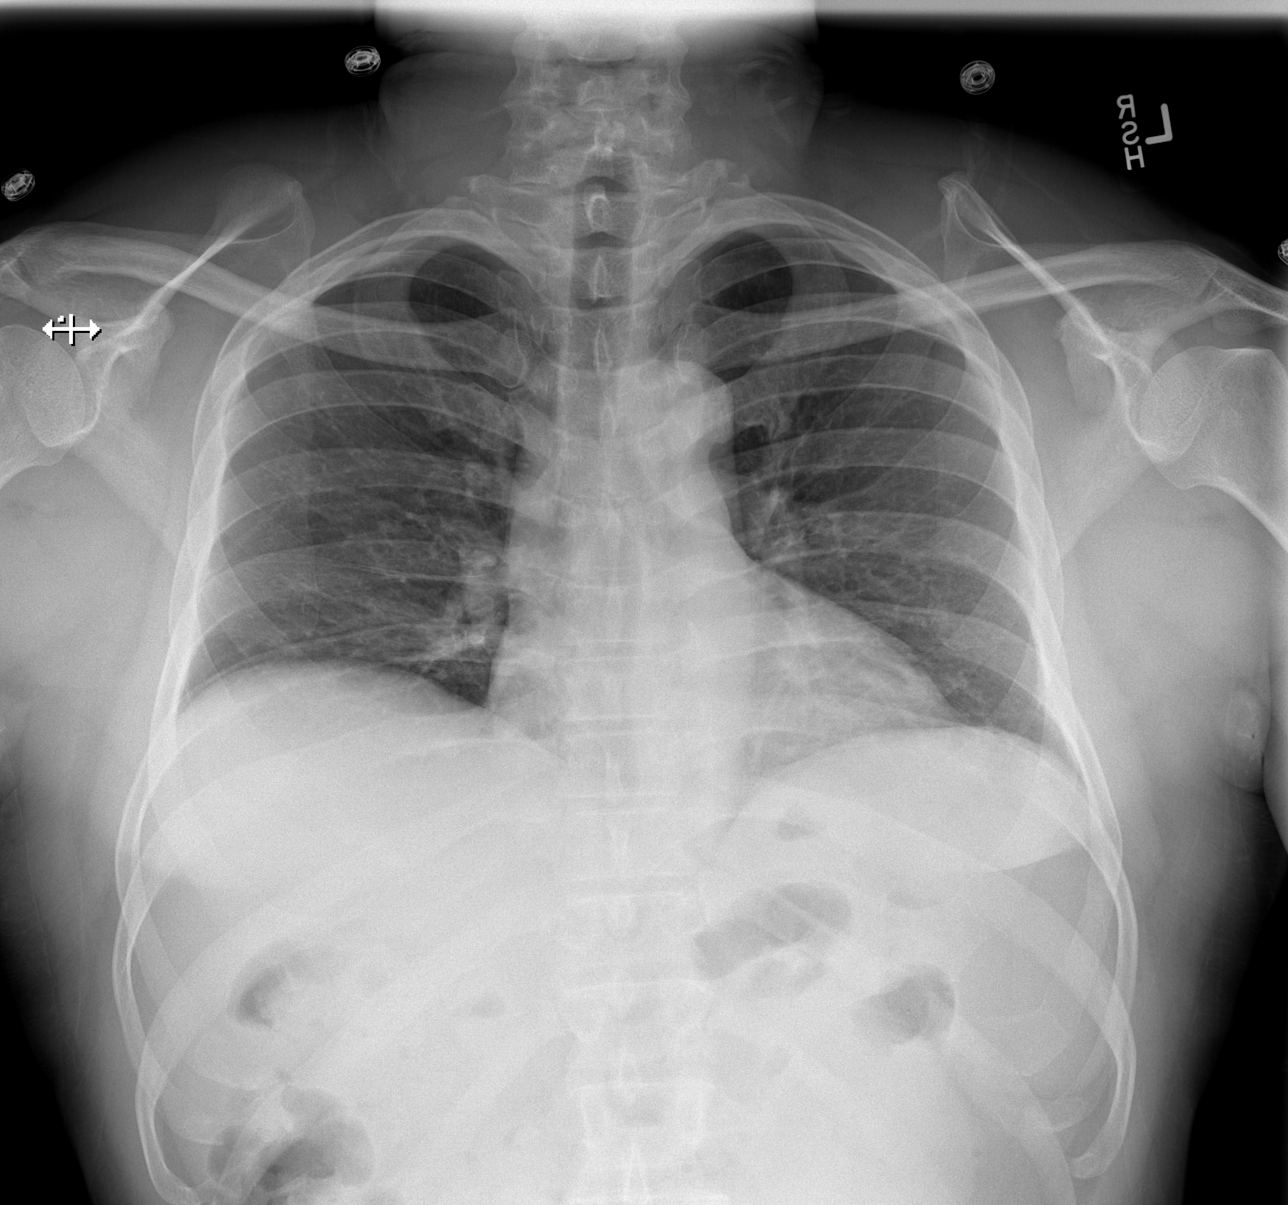

[w abdomen upright]
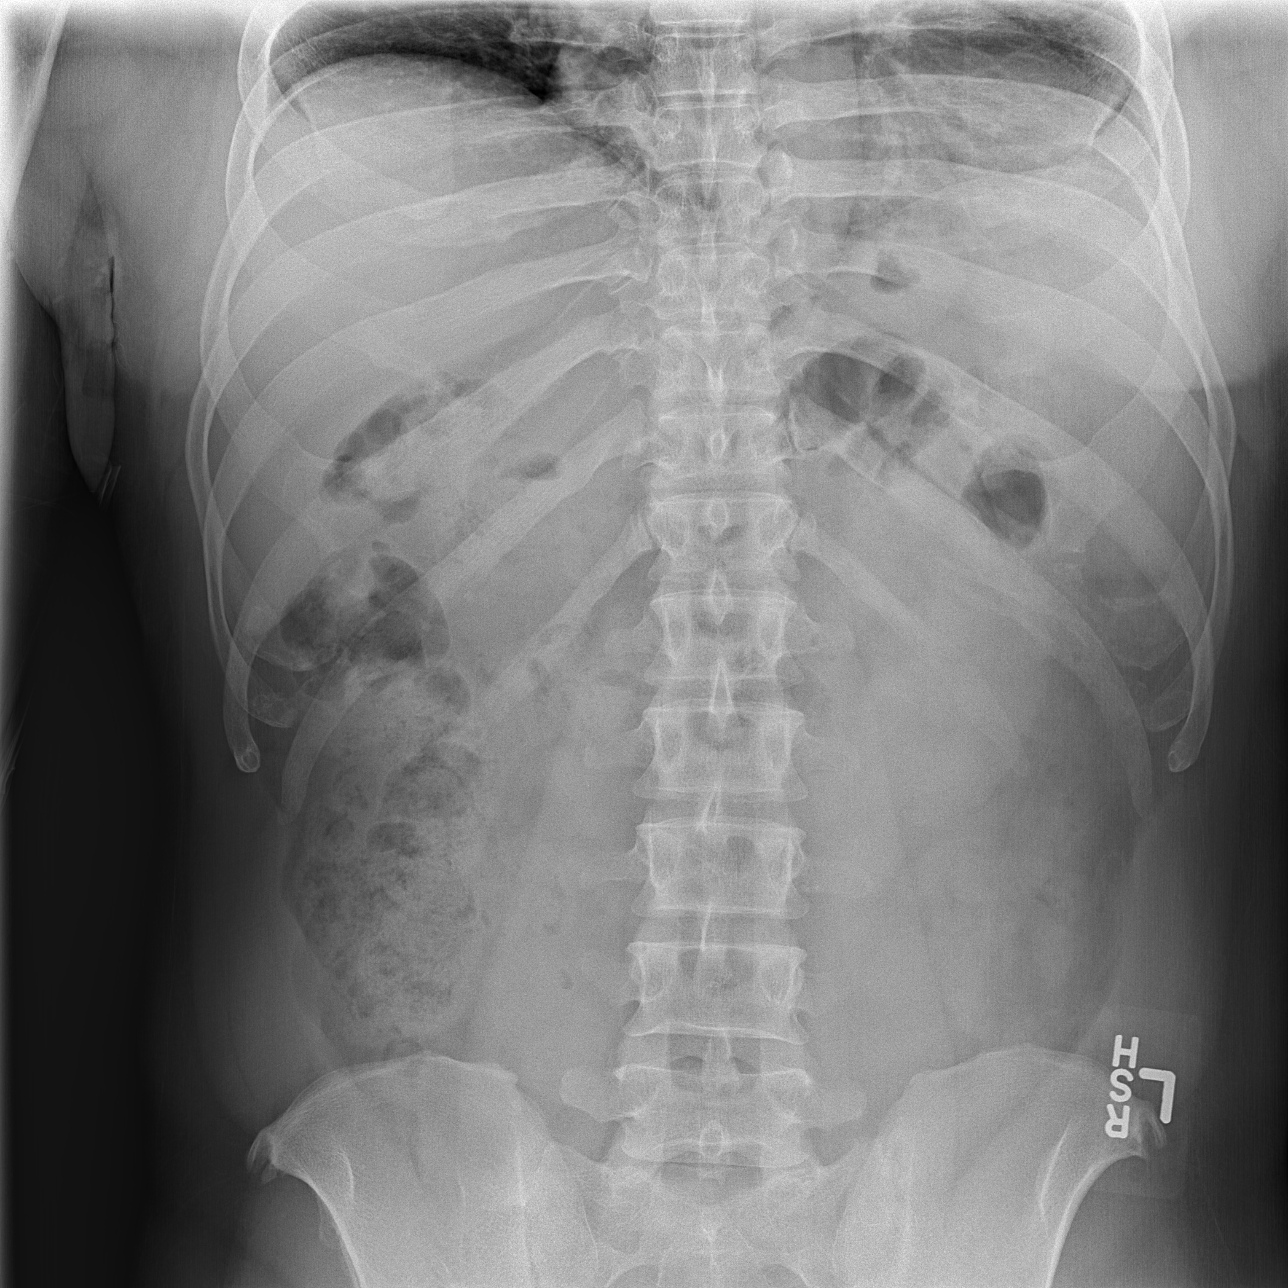

[t abdomen supine]
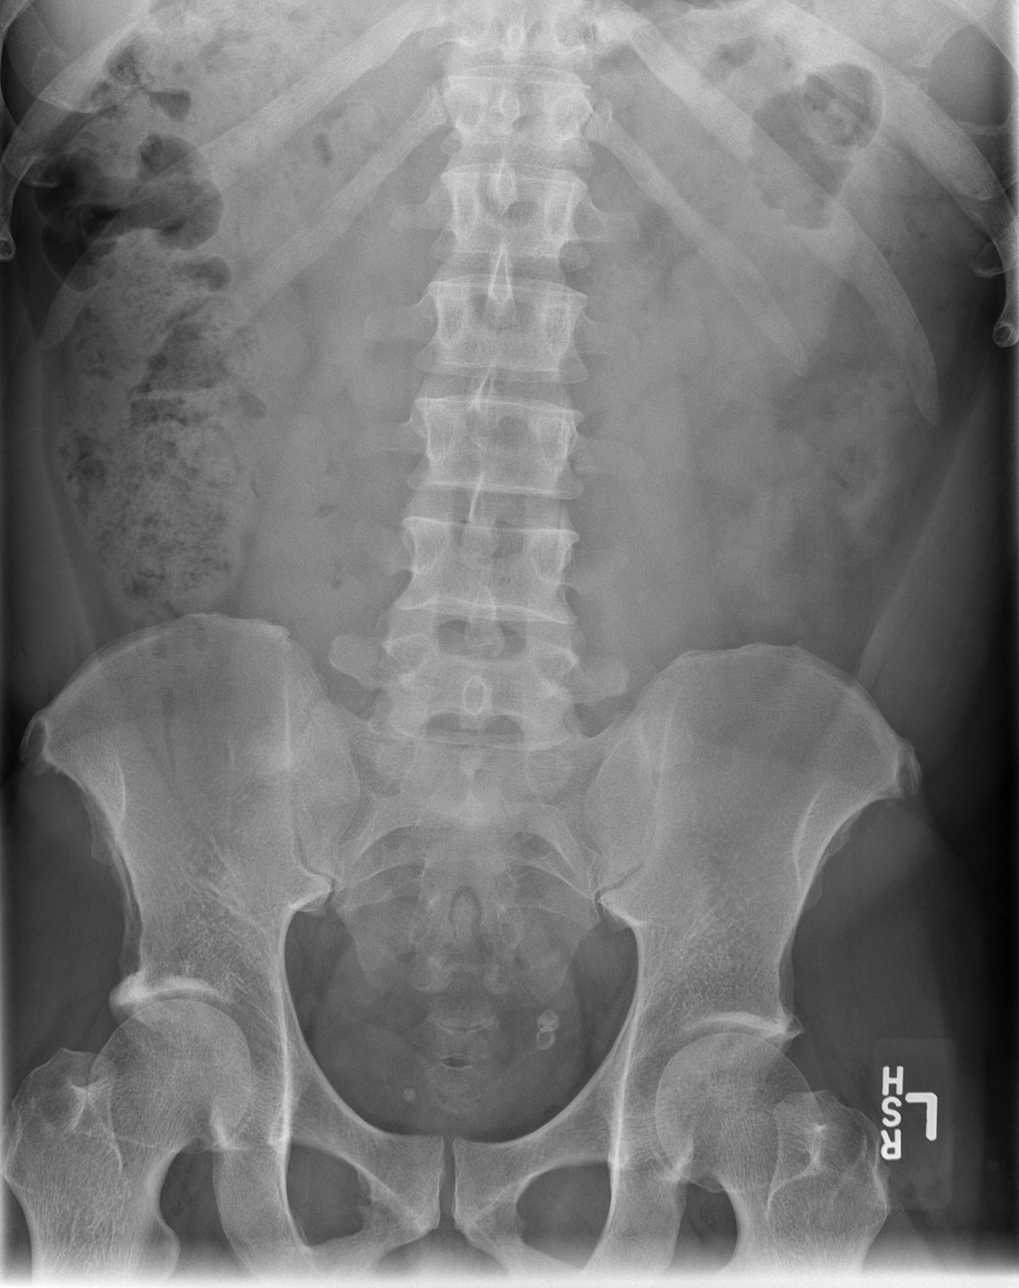

[3 of 3 positions shown; findings below may reference images not displayed]

FINDINGS: Mediastinum and hilar structures normal. Low lung volumes with mild
basilar atelectasis. No pleural effusion or pneumothorax.

Soft tissue structures the abdomen are unremarkable. No bowel
distention. Pelvic calcifications consistent phleboliths. No acute
bony abnormality.
IMPRESSION: 1. Low lung volumes with mild bibasilar atelectasis. Mild
infiltrates cannot be excluded.

2. No acute intraabdominal abnormality identified. Pelvic
calcifications consistent with phleboliths.

## 2017-07-09 ENCOUNTER — Ambulatory Visit
Admission: RE | Admit: 2017-07-09 | Discharge: 2017-07-09 | Disposition: A | Discharge status: Home / Self Care | Payer: BLUE CROSS/BLUE SHIELD | Source: Ambulatory Visit | Attending: General Surgery | Admitting: General Surgery

## 2017-07-09 DIAGNOSIS — N281 Cyst of kidney, acquired: Secondary | ICD-10-CM | POA: Diagnosis not present

## 2017-07-09 DIAGNOSIS — Z09 Encounter for follow-up examination after completed treatment for conditions other than malignant neoplasm: Secondary | ICD-10-CM

## 2017-07-09 MED ORDER — IOPAMIDOL (ISOVUE-300) INJECTION 61%
100.0000 mL | Freq: Once | INTRAVENOUS | Status: AC | PRN
  Administered 2017-07-09: 100 mL via INTRAVENOUS

## 2017-07-29 ENCOUNTER — Emergency Department (HOSPITAL_COMMUNITY): Payer: BLUE CROSS/BLUE SHIELD

## 2017-07-29 ENCOUNTER — Other Ambulatory Visit: Payer: Self-pay

## 2017-07-29 ENCOUNTER — Encounter (HOSPITAL_COMMUNITY): Payer: Self-pay

## 2017-07-29 DIAGNOSIS — R079 Chest pain, unspecified: Secondary | ICD-10-CM | POA: Insufficient documentation

## 2017-07-29 DIAGNOSIS — R0602 Shortness of breath: Secondary | ICD-10-CM | POA: Diagnosis not present

## 2017-07-29 DIAGNOSIS — R110 Nausea: Secondary | ICD-10-CM | POA: Diagnosis not present

## 2017-07-29 DIAGNOSIS — Z5321 Procedure and treatment not carried out due to patient leaving prior to being seen by health care provider: Secondary | ICD-10-CM | POA: Diagnosis not present

## 2017-07-29 DIAGNOSIS — R109 Unspecified abdominal pain: Secondary | ICD-10-CM | POA: Insufficient documentation

## 2017-07-29 LAB — BASIC METABOLIC PANEL
Anion gap: 12 (ref 5–15)
BUN: 11 mg/dL (ref 6–20)
CALCIUM: 9 mg/dL (ref 8.9–10.3)
CO2: 21 mmol/L — AB (ref 22–32)
CREATININE: 1.2 mg/dL (ref 0.61–1.24)
Chloride: 106 mmol/L (ref 101–111)
GFR calc Af Amer: >60 mL/min (ref >60)
GLUCOSE: 124 mg/dL — AB (ref 65–99)
Potassium: 3.5 mmol/L (ref 3.5–5.1)
Sodium: 139 mmol/L (ref 135–145)

## 2017-07-29 LAB — CBC
HCT: 41.5 % (ref 39.0–52.0)
HEMOGLOBIN: 14.1 g/dL (ref 13.0–17.0)
MCH: 31.1 pg (ref 26.0–34.0)
MCHC: 34 g/dL (ref 30.0–36.0)
MCV: 91.4 fL (ref 78.0–100.0)
Platelets: 209 10*3/uL (ref 150–400)
RBC: 4.54 MIL/uL (ref 4.22–5.81)
RDW: 13.9 % (ref 11.5–15.5)
WBC: 7 10*3/uL (ref 4.0–10.5)

## 2017-07-29 LAB — I-STAT TROPONIN, ED: TROPONIN I, POC: 0 ng/mL (ref 0.00–0.08)

## 2017-07-29 NOTE — ED Triage Notes (Signed)
Pt endorses chest pain with shob and nausea that began this morning while laying down. Pt also has had abd pain since yesterday with upset stomach. No vomiting.VSS. Breath sounds clear. Able to speak in complete sentences.

## 2017-07-30 ENCOUNTER — Emergency Department (HOSPITAL_COMMUNITY)
Admission: EM | Admit: 2017-07-30 | Discharge: 2017-07-30 | Discharge status: Against Medical Advice | Payer: BLUE CROSS/BLUE SHIELD | Attending: Emergency Medicine | Admitting: Emergency Medicine

## 2017-07-30 DIAGNOSIS — Z0000 Encounter for general adult medical examination without abnormal findings: Secondary | ICD-10-CM | POA: Diagnosis not present

## 2017-07-30 DIAGNOSIS — E119 Type 2 diabetes mellitus without complications: Secondary | ICD-10-CM | POA: Diagnosis not present

## 2017-07-30 DIAGNOSIS — E559 Vitamin D deficiency, unspecified: Secondary | ICD-10-CM | POA: Diagnosis not present

## 2017-07-30 DIAGNOSIS — R5383 Other fatigue: Secondary | ICD-10-CM | POA: Diagnosis not present

## 2017-07-30 NOTE — ED Notes (Signed)
Pt lwbs 

## 2017-07-30 NOTE — ED Notes (Signed)
07/30/2017, Follow-up call completed.

## 2017-07-30 NOTE — ED Notes (Signed)
Called for room with no answer.  °

## 2017-08-14 DIAGNOSIS — E782 Mixed hyperlipidemia: Secondary | ICD-10-CM | POA: Diagnosis not present

## 2017-08-14 DIAGNOSIS — E559 Vitamin D deficiency, unspecified: Secondary | ICD-10-CM | POA: Diagnosis not present

## 2017-08-14 DIAGNOSIS — R635 Abnormal weight gain: Secondary | ICD-10-CM | POA: Diagnosis not present

## 2017-09-17 ENCOUNTER — Encounter: Payer: Self-pay | Admitting: Physical Therapy

## 2017-09-17 ENCOUNTER — Other Ambulatory Visit: Payer: Self-pay

## 2017-09-17 ENCOUNTER — Ambulatory Visit: Payer: BLUE CROSS/BLUE SHIELD | Attending: General Surgery | Admitting: Physical Therapy

## 2017-09-17 DIAGNOSIS — M6281 Muscle weakness (generalized): Secondary | ICD-10-CM | POA: Insufficient documentation

## 2017-09-17 DIAGNOSIS — M62838 Other muscle spasm: Secondary | ICD-10-CM | POA: Diagnosis not present

## 2017-09-17 NOTE — Therapy (Signed)
[facility] [address] [place], [place], [address] Phone: [phone]   Fax:  [phone]  Physical Therapy Evaluation  Patient Details  Name: Jesse Ho MRN: [id number] Date of Birth: [birth year] Referring Provider: Dr. Gaynelle Adu   Encounter Date: 09/17/2017  PT End of Session - 09/17/17 1147    Visit Number  1    Date for PT Re-Evaluation  11/12/17    Authorization Type  [employer]    PT Start Time  1100    PT Stop Time  1145    PT Time Calculation (min)  45 min    Activity Tolerance  Patient tolerated treatment well    Behavior During Therapy  [facility] for tasks assessed/performed       Past Medical History:  Diagnosis Date  . Arthritis    played foot ball  . Cervical dystonia   . CVA (cerebral vascular accident) (HCC)    mini stroke  . Hyperlipidemia   . Hypertension     Past Surgical History:  Procedure Laterality Date  . CYSTECTOMY Left 10/19/2014   left branchial cleft cyst removed  . FRACTURE SURGERY     fx fingers on left hand playing football   and bil L wrist   . INSERTION OF MESH N/A 03/19/2017   Procedure: INSERTION OF MESH;  Surgeon: Gaynelle Adu, MD;  Location: [facility];  Service: General;  Laterality: N/A;  . TEE WITHOUT CARDIOVERSION N/A 10/27/2016   Procedure: TRANSESOPHAGEAL ECHOCARDIOGRAM (TEE);  Surgeon: Yates Decamp, MD;  Location[facility]OSCOPY;  Service: Cardiovascular;  Laterality: N/A;  . UMBILICAL HERNIA REPAIR N/A 03/19/2017   Procedure: LAPAROSCOPIC SUPRA UMBILICAL AND UMBILICAL HERNIA REPAIR WITH MESH;  Surgeon: Gaynelle Adu, MD;  Location: [facility];  Service: General;  Laterality: N/A;    There were no vitals filed for this visit.   Subjective Assessment - 09/17/17 1107    Subjective  Patient had ventral hernia surgery 03/19/2017. Patient reports a lump in his stomach and causes pain with sitting too long.     Patient Stated Goals  reduce pain    Currently in Pain?  Yes    Pain  Score  6     Pain Location  Abdomen    Pain Orientation  Right    Pain Descriptors / Indicators  Burning;Aching;Discomfort    Pain Type  Acute pain    Pain Onset  More than a month ago    Pain Frequency  Intermittent    Aggravating Factors   sitting up long period of time    Pain Relieving Factors  stand    Multiple Pain Sites  No         [facility] PT Assessment - 09/17/17 0001      Assessment   Medical Diagnosis  Z98.890 History of Vantral Hernia Repair    Referring Provider  Dr. Gaynelle Adu    Onset Date/Surgical Date  03/19/18    Prior Therapy  None      Precautions   Precautions  Other (comment)    Precaution Comments  No lifting heavy weights      Restrictions   Weight Bearing Restrictions  No      Balance Screen   Has the patient fallen in the past 6 months  No    Has the patient had a decrease in activity level because of a fear of falling?   No    Is the patient reluctant to leave their home because of  a fear of falling?   No      Home Public house manager residence      Prior Function   Level of Independence  Independent    Vocation  Full time employment    Science writer, lift cameras, sitting      Cognition   Overall Cognitive Status  Within Functional Limits for tasks assessed      Posture/Postural Control   Posture/Postural Control  Postural limitations    Posture Comments  leans to the left      ROM / Strength   AROM / PROM / Strength  AROM;PROM;Strength      AROM   Thoracic - Right Side Bend  decreased by 25% due to feeling he is hitting the lump      Strength   Overall Strength Comments  abdominal strength is 1/5    Right Hip Flexion  3+/5    Right Hip Extension  4/5    Right Hip External Rotation   4/5    Right Hip Internal Rotation  4/5    Right Hip ABduction  3+/5      Palpation   SI assessment   right ilium rotated anteriorly    Palpation comment  tightness in the diaphgram, palpable lump on the  right side of transverse abdominus , diaphgram is tight and tender , tenderness located suprapubically, bil. iliopsias, decreased lower rib cage mobility      Transfers   Transfers  Not assessed      Ambulation/Gait   Ambulation/Gait  No                Objective measurements completed on examination: See above findings.    Pelvic Floor Special Questions - 09/17/17 0001    Urinary Leakage  -- dribble after urinating    Fecal incontinence  -- bowel movement every 2-3 days and strains       [facility] Adult PT Treatment/Exercise - 09/17/17 0001      Manual Therapy   Manual Therapy  Soft tissue mobilization;Joint mobilization    Joint Mobilization  mobilization of lower rib cage    Soft tissue mobilization  abdominal and diaphgram             PT Education - 09/17/17 1146    Education provided  Yes    Education Details  abdmoninal massage, abdominal bracing with breathing    Person(s) Educated  Patient    Methods  Explanation;Demonstration;Verbal cues;Handout    Comprehension  Returned demonstration;Verbalized understanding       PT Short Term Goals - 09/17/17 1155      PT SHORT TERM GOAL #1   Title  abdominal pain in sitting decreased >/= 25%    Time  4    Period  Weeks    Status  New    Target Date  10/15/17      PT SHORT TERM GOAL #2   Title  abdominal strength increases to 2/5 due to improved tissue mobility    Time  4    Period  Weeks    Status  New    Target Date  10/15/17        PT Long Term Goals - 09/17/17 1155      PT LONG TERM GOAL #1   Title  Independent with HEP and how to progress himself at the gym    Time  8    Period  Weeks    Status  New    Target Date  11/12/17      PT LONG TERM GOAL #2   Title  sit for 2 hours with minimal to no pain due to improve tissue mobiltiy and abdominal strength    Time  8    Period  Weeks    Status  New    Target Date  11/12/17      PT LONG TERM GOAL #3   Title  after urination no dribbling due  to improve core strength    Time  8    Period  Weeks    Status  New    Target Date  11/12/17      PT LONG TERM GOAL #4   Title  right sidebend is full due to reduction of lump in right transverse abdominus    Time  8    Period  Weeks    Status  New    Target Date  11/12/17      PT LONG TERM GOAL #5   Title  ----             Plan - 09/17/17 1149    Clinical Impression Statement  Patient is a 42 year old male with abdominal pain and lump after Ventral hernia repair 03/19/2017.  Patient reports his intermittent pain level is 6/10 with long term sitting.  Patient reports he will dribble urine after urination.  Patient reports bowel movement every 3 days and sometimes has to strain.  Right hip strength 3+/5.  Abdomial strength is 1/5 and is firm to touch.  Pateint has a tender lump in the right mid transverse abdominus that protrudes more in standing.  Patient has decreased mobility of diaphgram and lower rib cage.  Tenderness located bilateral psoas and lower abdomen.  Right ilium is rotated anteriorly.  Patient will benefit from skilled therapy to improve tissue mobility, increase strength and improve function with sitting , urine dribble  and daily activities.     History and Personal Factors relevant to plan of care:  Ventral hernia repair 03/19/2017    Clinical Presentation  Stable    Clinical Presentation due to:  stable condition    Clinical Decision Making  Low    Rehab Potential  Excellent    Clinical Impairments Affecting Rehab Potential  Ventral hernia repair 03/19/2017    PT Frequency  2x / week    PT Duration  8 weeks    PT Treatment/Interventions  Cryotherapy;Electrical Stimulation;Iontophoresis 4mg /ml Dexamethasone;Moist Heat;Ultrasound;Therapeutic activities;Therapeutic exercise;Patient/family education;Neuromuscular re-education;Manual techniques;Dry needling;Taping;Scar mobilization    PT Next Visit Plan  right hip strength, soft tissue, abdominal strength, rib  mobility    PT Home Exercise Plan  progress as needed    Consulted and Agree with Plan of Care  Patient       Patient will benefit from skilled therapeutic intervention in order to improve the following deficits and impairments:  Pain, Increased fascial restricitons, Decreased scar mobility, Increased muscle spasms, Decreased endurance, Decreased activity tolerance, Decreased strength  Visit Diagnosis: Muscle weakness (generalized) - Plan: PT plan of care cert/re-cert  Other muscle spasm - Plan: PT plan of care cert/re-cert     Problem List Patient Active Problem List   Diagnosis Date Noted  . 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 Left-sided weakness 11/04/2016  . Left facial numbness 10/20/2016  . TIA (transient ischemic attack)   . Cervical dystonia 05/20/2016    Eulis Fosterheryl Gray, PT 09/17/17 12:00 PM   [facility] [address], [address] [place], [place], [address] Phone: [phone]   Fax:  [phone]  Name: Jesse Ho MRN: [id number] Date of Birth: 05-09-1976

## 2017-09-17 NOTE — Patient Instructions (Addendum)
About Abdominal Massage  Abdominal massage, also called external colon massage, is a self-treatment circular massage technique that can reduce and eliminate gas and ease constipation. The colon naturally contracts in waves in a clockwise direction starting from inside the right hip, moving up toward the ribs, across the belly, and down inside the left hip.  When you perform circular abdominal massage, you help stimulate your colon's normal wave pattern of movement called peristalsis.  It is most beneficial when done after eating.  Positioning You can practice abdominal massage with oil while lying down, or in the shower with soap.  Some people find that it is just as effective to do the massage through clothing while sitting or standing.  How to Massage Start by placing your finger tips or knuckles on your right side, just inside your hip bone.  . Make small circular movements while you move upward toward your rib cage.   . Once you reach the bottom right side of your rib cage, take your circular movements across to the left side of the bottom of your rib cage.  . Next, move downward until you reach the inside of your left hip bone.  This is the path your feces travel in your colon. . Continue to perform your abdominal massage in this pattern for 10 minutes each day.     You can apply as much pressure as is comfortable in your massage.  Start gently and build pressure as you continue to practice.  Notice any areas of pain as you massage; areas of slight pain may be relieved as you massage, but if you have areas of significant or intense pain, consult with your healthcare provider.  Other Considerations . General physical activity including bending and stretching can have a beneficial massage-like effect on the colon.  Deep breathing can also stimulate the colon because breathing deeply activates the same nervous system that supplies the colon.   . Abdominal massage should always be used in  combination with a bowel-conscious diet that is high in the proper type of fiber for you, fluids (primarily water), and a regular exercise program.  Stabilization: Diaphragmatic Breathing    Lie with knees bent, feet flat. Place one hand on stomach, other on chest. Breathe deeply through nose, lifting belly hand without any motion of hand on chest. When breath out depress the rib Cage Repeat _10___ times per set. Do __1__ sets per session. Do _3___ sessions per week.  Copyright  VHI. All rights reserved.  [facility] [address], [address] [place], [place] [address] Phone # [phone] Fax [phone]

## 2017-09-23 IMAGING — CT CT ABD-PELV W/ CM
2 of 5 series · 14 of 46 positions shown, 16 images · IV contrast (APPLIED)
Comparison: Abdominal radiographs, 12/02/2016

CLINICAL DATA: Complaints of umbilical hernia pain since [DATE].
Denies nausea, vomiting, diarrhea or constipation

EXAM:
CT ABDOMEN AND PELVIS WITH CONTRAST
TECHNIQUE: Multidetector CT imaging of the abdomen and pelvis was performed
using the standard protocol following bolus administration of
intravenous contrast.
CONTRAST:  100mL 602JNI-ICC IOPAMIDOL (602JNI-ICC) INJECTION 61%

[Series 3: abdomen 5.0 · axial · 0.92mm/px · z∈[-368,+57]mm · 11 of 103 slices shown, 13 images]
[im 9/103  soft-tissue]
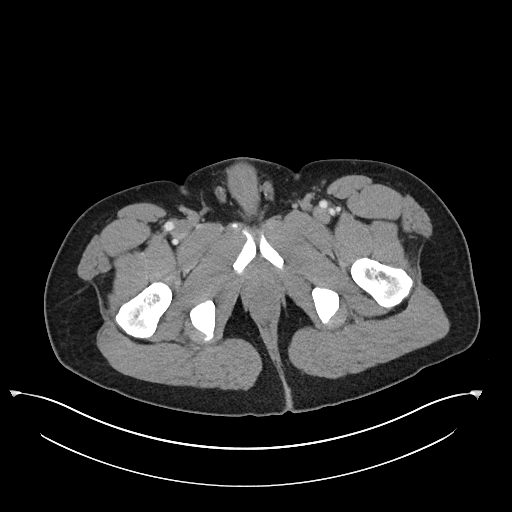
[im 9/103  bone]
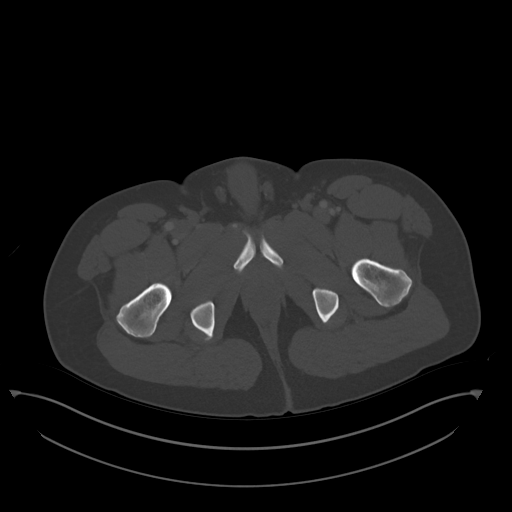
[im 18/103  soft-tissue]
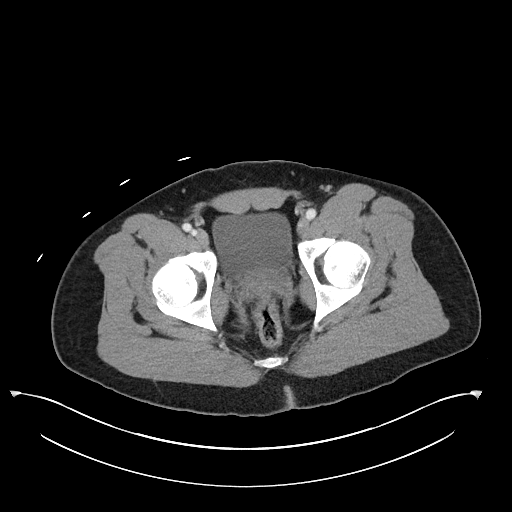
[im 26/103  soft-tissue]
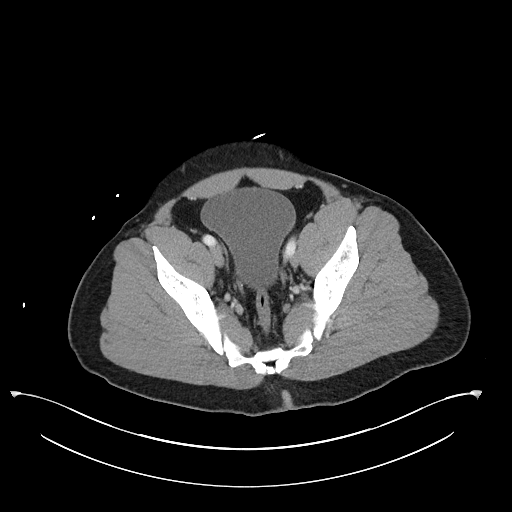
[im 35/103  soft-tissue]
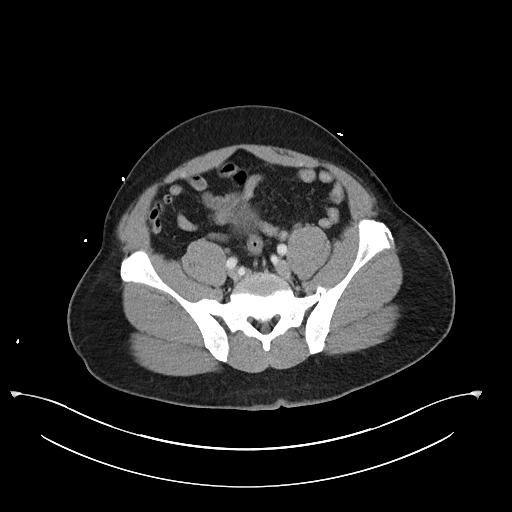
[im 43/103  soft-tissue]
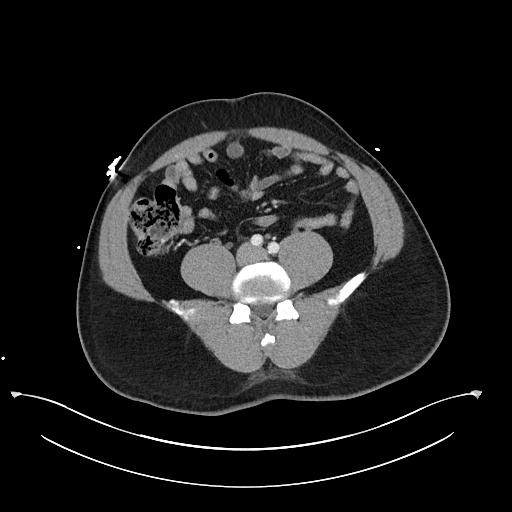
[im 52/103  soft-tissue]
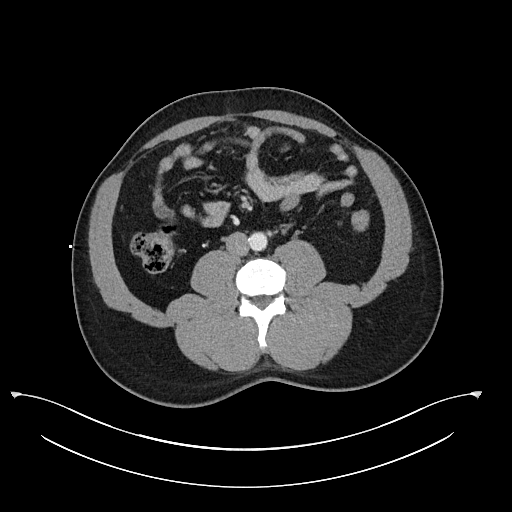
[im 60/103  soft-tissue]
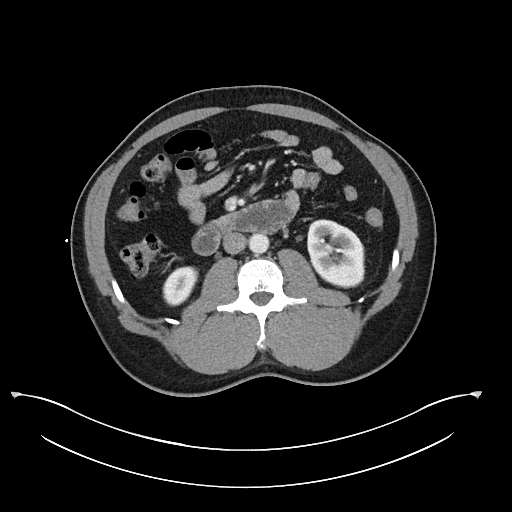
[im 69/103  soft-tissue]
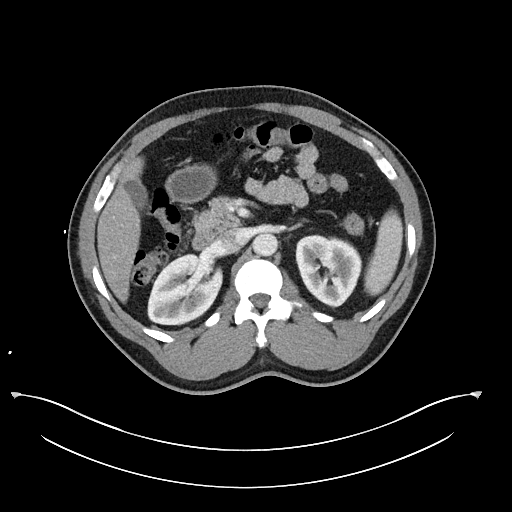
[im 77/103  soft-tissue]
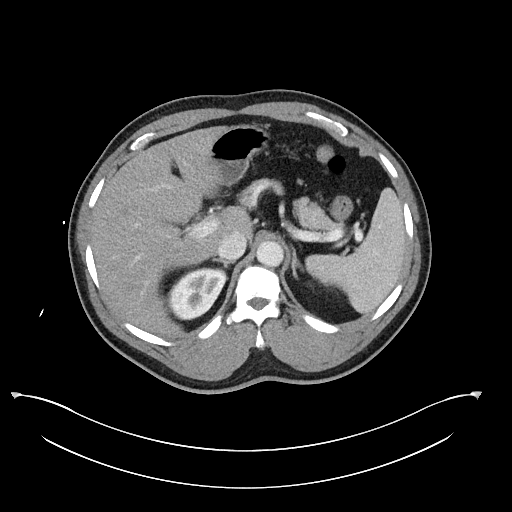
[im 77/103  bone]
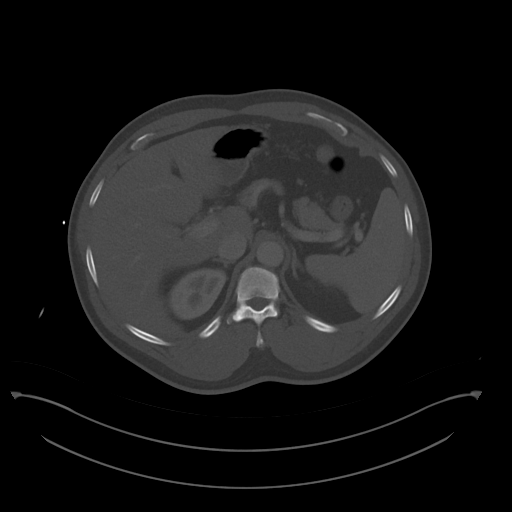
[im 86/103  soft-tissue]
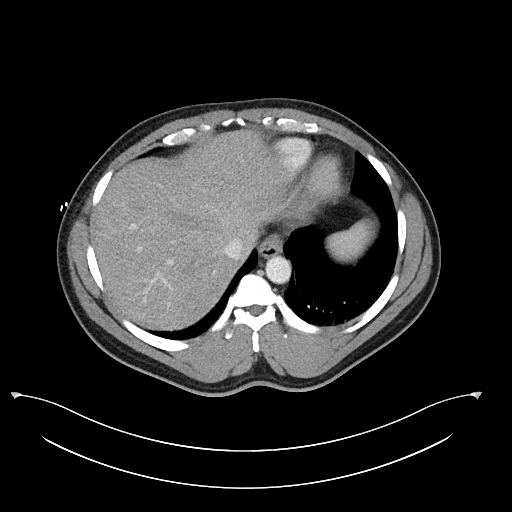
[im 94/103  soft-tissue]
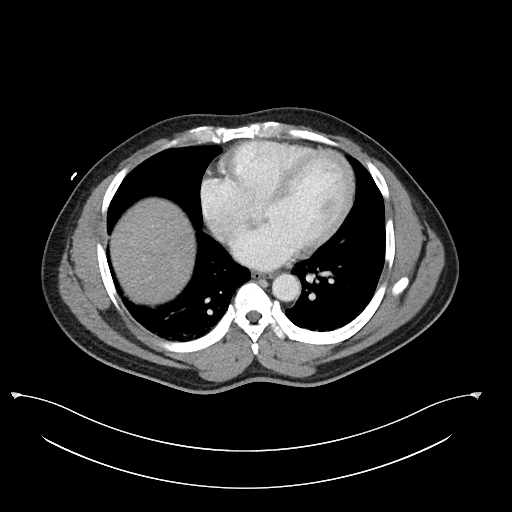

[Series 6: abdomen 3.0 mpr cor · coronal · 0.76mm/px · 3 of 105 slices shown]
[im 35/105  soft-tissue]
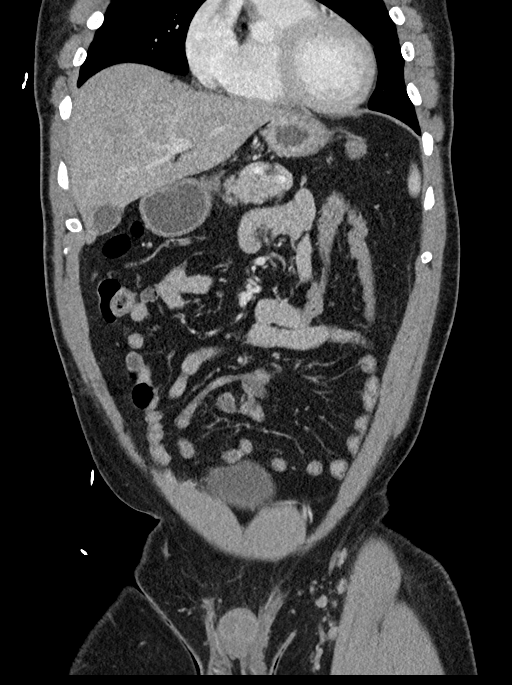
[im 47/105  soft-tissue]
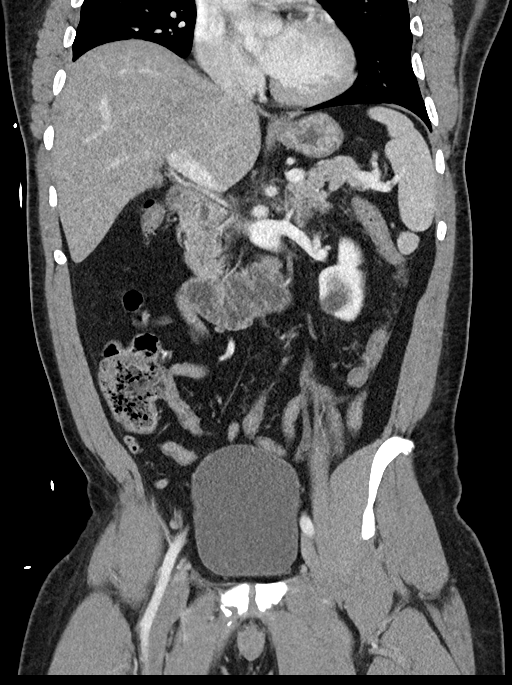
[im 58/105  soft-tissue]
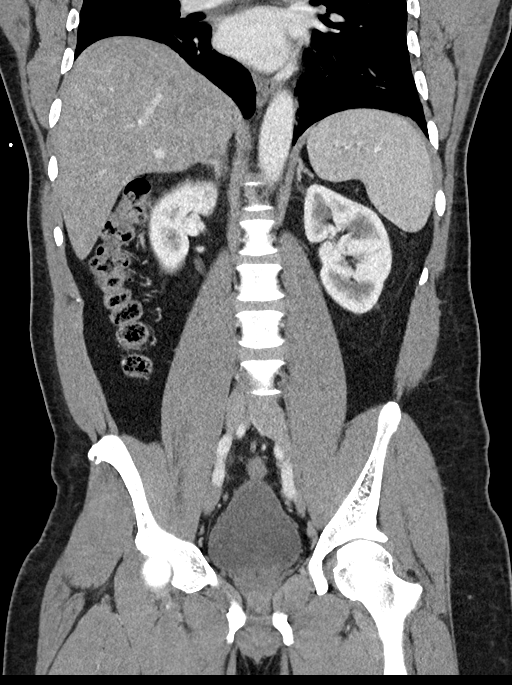

[14 of 46 positions shown; findings below may reference images not displayed]

FINDINGS: Lower chest: No acute abnormality.

Hepatobiliary: No focal liver abnormality is seen. No gallstones,
gallbladder wall thickening, or biliary dilatation.

Pancreas: Unremarkable. No pancreatic ductal dilatation or
surrounding inflammatory changes.

Spleen: Normal in size without focal abnormality.

Adrenals/Urinary Tract: No adrenal masses.

Bilateral small low-density renal masses. Largest arises from the
left kidney lower pole measuring 2.3 cm with Hounsfield units near
water density consistent with a simple cyst. Largest lesion on the
right arises from the lower pole measuring 8 mm with Hounsfield
units near water density also consistent with a cyst. There are no
renal stones. No hydronephrosis. Ureters are normal course and in
caliber. Bladder is unremarkable.

Stomach/Bowel: Stomach is within normal limits. Appendix appears
normal. No evidence of bowel wall thickening, distention, or
inflammatory changes.

Vascular/Lymphatic: No significant vascular findings are present. No
enlarged abdominal or pelvic lymph nodes.

Reproductive: Prostate is unremarkable.

Other: There are 2 adjacent umbilical hernias. There is a small fat
containing umbilical hernia, base measuring 17 mm in width. Just
superior to this there is a smaller hernia containing fat and low to
intermediate attenuation material, which may reflect inflammation in
herniated mesenteric. No bowel enters either of these hernias.

No ascites.

Musculoskeletal: No acute or significant osseous findings.
IMPRESSION: 1. Two small adjacent umbilical hernias, the smaller of which, lying
in the immediate supraumbilical position, containing some low to
intermediate attenuation material that may reflect inflamed
mesenteric. No bowel enters either of these hernias. The smaller
hernia may account for the patient's umbilical hernia pain.
2. No other significant abnormalities within the abdomen or pelvis.

## 2017-10-01 ENCOUNTER — Ambulatory Visit: Payer: BLUE CROSS/BLUE SHIELD | Admitting: Physical Therapy

## 2017-10-01 ENCOUNTER — Telehealth: Payer: Self-pay | Admitting: Physical Therapy

## 2017-10-01 NOTE — Telephone Encounter (Signed)
Left message to call back.  Patient no showed for his visit on 10/01/2017 at 10:15.  Jesse FosterCheryl Gray, PT @4 /26/2019@ 10:35 AM

## 2017-11-08 DIAGNOSIS — M19031 Primary osteoarthritis, right wrist: Secondary | ICD-10-CM | POA: Diagnosis not present

## 2017-11-08 DIAGNOSIS — M19131 Post-traumatic osteoarthritis, right wrist: Secondary | ICD-10-CM | POA: Diagnosis not present

## 2017-11-08 DIAGNOSIS — M25531 Pain in right wrist: Secondary | ICD-10-CM | POA: Diagnosis not present

## 2018-01-22 ENCOUNTER — Other Ambulatory Visit: Payer: Self-pay | Admitting: Podiatry

## 2018-01-22 ENCOUNTER — Encounter: Payer: Self-pay | Admitting: Podiatry

## 2018-01-22 ENCOUNTER — Ambulatory Visit: Payer: BLUE CROSS/BLUE SHIELD | Admitting: Podiatry

## 2018-01-22 ENCOUNTER — Ambulatory Visit (INDEPENDENT_AMBULATORY_CARE_PROVIDER_SITE_OTHER): Payer: BLUE CROSS/BLUE SHIELD

## 2018-01-22 DIAGNOSIS — M779 Enthesopathy, unspecified: Secondary | ICD-10-CM | POA: Diagnosis not present

## 2018-01-22 DIAGNOSIS — M7752 Other enthesopathy of left foot: Secondary | ICD-10-CM

## 2018-01-22 DIAGNOSIS — B353 Tinea pedis: Secondary | ICD-10-CM | POA: Diagnosis not present

## 2018-01-22 DIAGNOSIS — M7751 Other enthesopathy of right foot: Secondary | ICD-10-CM

## 2018-01-22 MED ORDER — METHYLPREDNISOLONE 4 MG PO TBPK
ORAL_TABLET | ORAL | 0 refills | Status: DC
Start: 2018-01-22 — End: 2018-09-13

## 2018-01-22 MED ORDER — MELOXICAM 15 MG PO TABS: 15.0000 mg | ORAL_TABLET | Freq: Every day | ORAL | 1 refills | Status: AC

## 2018-01-22 MED ORDER — TERBINAFINE HCL 250 MG PO TABS: 250.0000 mg | ORAL_TABLET | Freq: Every day | ORAL | 0 refills | Status: DC

## 2018-01-26 NOTE — Progress Notes (Signed)
   Subjective: 42 year old male presenting today as a new patient with a chief complaint of aching pain to the bilateral second toes, right worse than left, that began 3-4 days ago. He states the toes feel as if they are fractured but he denies any trauma or injury. He also reports fungus to the right great toenail that has been present for several months. He states part of the nail broke off two days ago and is now sore at the nail bed. He has not done anything for treatment. Patient is here for further evaluation and treatment.   Past Medical History:  Diagnosis Date  . Arthritis    played foot ball  . Cervical dystonia   . CVA (cerebral vascular accident) (HCC)    mini stroke  . Hyperlipidemia   . Hypertension     Objective: Physical Exam General: The patient is alert and oriented x3 in no acute distress.  Dermatology: Hyperkeratotic, discolored, thickened, onychodystrophy of the right great toenail. Pruritus to the interdigital areas to bilateral feet with hyperkeratosis. Skin is warm, dry and supple bilateral lower extremities. Negative for open lesions or macerations.  Vascular: Palpable pedal pulses bilaterally. No edema or erythema noted. Capillary refill within normal limits.  Neurological: Epicritic and protective threshold grossly intact bilaterally.   Musculoskeletal Exam: Pain with palpation to the 2nd MPJ of bilateral feet. Range of motion within normal limits to all pedal and ankle joints bilateral. Muscle strength 5/5 in all groups bilateral.   Radiographic Exam:  Normal osseous mineralization. Joint spaces preserved. No fracture/dislocation/boney destruction.    Assessment: #1 2nd MPJ capsulitis bilateral #2 Tinea Pedis bilateral #3 Onychomycosis right hallux   Plan of Care:  #1 Patient was evaluated. X-Rays reviewed.  #2 Injection of 0.5 mLs Celestone Soluspan injected into the 2nd MPJ of the right foot.  #3 Prescription for Medrol Dose Pak provided to  patient.  #4 Prescription for Meloxicam provided to patient.  #5 Prescription for Lamisil 250 mg #90 provided to patient.  #6 Recommended stiff sole shoes.  #7 Return to clinic as needed.    Felecia ShellingBrent M. Maurizio Geno, DPM [facility]  Dr. Felecia ShellingBrent M. Janira Mandell, DPM    [address]                                        [place], [place] [address]                Office [phone]  Fax [phone]

## 2018-02-28 DIAGNOSIS — Z23 Encounter for immunization: Secondary | ICD-10-CM | POA: Diagnosis not present

## 2018-03-16 DIAGNOSIS — M25572 Pain in left ankle and joints of left foot: Secondary | ICD-10-CM | POA: Diagnosis not present

## 2018-03-23 DIAGNOSIS — M25572 Pain in left ankle and joints of left foot: Secondary | ICD-10-CM | POA: Diagnosis not present

## 2018-03-29 DIAGNOSIS — M25572 Pain in left ankle and joints of left foot: Secondary | ICD-10-CM | POA: Diagnosis not present

## 2018-09-13 ENCOUNTER — Encounter: Payer: Self-pay | Admitting: Cardiology

## 2018-09-13 ENCOUNTER — Other Ambulatory Visit: Payer: Self-pay

## 2018-09-13 ENCOUNTER — Ambulatory Visit: Payer: BC Managed Care – PPO | Admitting: Cardiology

## 2018-09-13 VITALS — BP 156/103 | HR 66 | Ht 66.0 in | Wt 204.4 lb

## 2018-09-13 DIAGNOSIS — R002 Palpitations: Secondary | ICD-10-CM | POA: Diagnosis not present

## 2018-09-13 DIAGNOSIS — I10 Essential (primary) hypertension: Secondary | ICD-10-CM

## 2018-09-13 DIAGNOSIS — R0789 Other chest pain: Secondary | ICD-10-CM | POA: Diagnosis not present

## 2018-09-13 DIAGNOSIS — E785 Hyperlipidemia, unspecified: Secondary | ICD-10-CM | POA: Diagnosis not present

## 2018-09-13 DIAGNOSIS — I209 Angina pectoris, unspecified: Secondary | ICD-10-CM

## 2018-09-13 HISTORY — DX: Palpitations: R00.2

## 2018-09-13 HISTORY — DX: Hyperlipidemia, unspecified: E78.5

## 2018-09-13 HISTORY — DX: Essential (primary) hypertension: I10

## 2018-09-13 HISTORY — DX: Angina pectoris, unspecified: I20.9

## 2018-09-13 MED ORDER — AMLODIPINE-VALSARTAN-HCTZ 5-160-25 MG PO TABS: 1.0000 | ORAL_TABLET | ORAL | 3 refills | Status: DC

## 2018-09-13 NOTE — Progress Notes (Signed)
Subjective:  Primary Physician:  Henrine Screws, MD  Patient ID: Jesse Ho, male    DOB: 01-20-1976, 43 y.o.   MRN: [id number]  Chief Complaint  Patient presents with  . Chest Pain    pt c/o chest pain for 1 month     HPI: VANDY TSUCHIYA  is a 43 y.o. male  with HLD, difficult to control hypertension on 10/19/16 with c/o intermittent L arm, facial and left leg numbness and weakness along with left sided chest pain with minimal exertion associated with dyspnea and drove to ER. MRI /MRA BRAIN negative. He was evaluated by neurology, Dr Roxy Manns. He felt symptoms were considered to be non-physiologic. He was also evaluated in the outpatient setting by neurology Dr. Tally Due. Patient had normal TEE except for minimal atheromatous changes in the thoracic aorta and normal event monitor.  Nuclear stress test on 12/28/2016 had revealed global hypokinesis without evidence of ischemia, EF 40 to 45%.  Felt to be hypertensive changes.  He called our office to be seen on an urgent basis today stating that he has been having chest pain for the past 1 month, described as tightness to sharp pain in the left side of the chest.  It is present continuously.  No exacerbating or relieving factors.  He has stopped taking all his medications for the past 2 to 3 months and states that he felt well hence discontinued blood pressure medications and also cholesterol medications.  Denies headache, visual disturbances, denies palpitations, leg edema.  Past Medical History:  Diagnosis Date  . Angina pectoris (HCC) 09/13/2018  . Arthritis    played foot ball  . Cervical dystonia   . CVA (cerebral vascular accident) (HCC)    mini stroke  . Essential hypertension 09/13/2018  . Mild hyperlipidemia 09/13/2018  . Palpitations 09/13/2018    Past Surgical History:  Procedure Laterality Date  . CYSTECTOMY Left 10/19/2014   left branchial cleft cyst removed  . FRACTURE SURGERY     fx fingers on left hand playing  football   and bil L wrist   . INSERTION OF MESH N/A 03/19/2017   Procedure: INSERTION OF MESH;  Surgeon: Gaynelle Adu, MD;  Location: [facility];  Service: General;  Laterality: N/A;  . TEE WITHOUT CARDIOVERSION N/A 10/27/2016   Procedure: TRANSESOPHAGEAL ECHOCARDIOGRAM (TEE);  Surgeon: Yates Decamp, MD;  Location: [facility] ENDOSCOPY;  Service: Cardiovascular;  Laterality: N/A;  . UMBILICAL HERNIA REPAIR N/A 03/19/2017   Procedure: LAPAROSCOPIC SUPRA UMBILICAL AND UMBILICAL HERNIA REPAIR WITH MESH;  Surgeon: Gaynelle Adu, MD;  Location: [facility];  Service: General;  Laterality: N/A;    Social History   Socioeconomic History  . Marital status: Married    Spouse name: Lowella Bandy  . Number of children: 2  . Years of education: 12+  . Highest education level: Not on file  Occupational History    Comment: [employer], designs websites  Social Needs  . Financial resource strain: Not on file  . Food insecurity:    Worry: Not on file    Inability: Not on file  . Transportation needs:    Medical: Not on file    Non-medical: Not on file  Tobacco Use  . Smoking status: Never Smoker  . Smokeless tobacco: Never Used  Substance and Sexual Activity  . Alcohol use: Yes    Comment: social  . Drug use: No  . Sexual activity: Yes  Lifestyle  . Physical activity:    Days per week: Not  on file    Minutes per session: Not on file  . Stress: Not on file  Relationships  . Social connections:    Talks on phone: Not on file    Gets together: Not on file    Attends religious service: Not on file    Active member of club or organization: Not on file    Attends meetings of clubs or organizations: Not on file    Relationship status: Not on file  . Intimate partner violence:    Fear of current or ex partner: Not on file    Emotionally abused: Not on file    Physically abused: Not on file    Forced sexual activity: Not on file  Other Topics Concern  . Not on file  Social History Narrative   Lives at home w/ his  wife and children   Right-handed   Caffeine: 3 cups of coffee/tea    Current Outpatient Medications on File Prior to Visit  Medication Sig Dispense Refill  . vitamin C (ASCORBIC ACID) 500 MG tablet Take 500 mg by mouth daily.     No current facility-administered medications on file prior to visit.    Review of Systems  Constitution: Negative for chills, decreased appetite, malaise/fatigue and weight gain.  Cardiovascular: Positive for chest pain and irregular heartbeat (occasional and may last for 5 min). Negative for dyspnea on exertion, leg swelling and syncope.  Endocrine: Negative for cold intolerance.  Hematologic/Lymphatic: Does not bruise/bleed easily.  Musculoskeletal: Negative for joint swelling.  Gastrointestinal: Negative for abdominal pain, anorexia and change in bowel habit.  Neurological: Positive for dizziness and headaches (mild). Negative for light-headedness and paresthesias.  Psychiatric/Behavioral: Negative for depression and substance abuse.  All other systems reviewed and are negative.     Objective:  Blood pressure (!) 156/103, pulse 66, height 5\' 6"  (1.676 m), weight 204 lb 6.4 oz (92.7 kg), SpO2 95 %. Body mass index is 32.99 kg/m.  Physical Exam  Constitutional: He appears well-nourished. No distress.  Moderately built and mildly obese  HENT:  Head: Atraumatic.  Eyes: Conjunctivae are normal.  Neck: Neck supple. No JVD present. No thyromegaly present.  Cardiovascular: Normal rate, regular rhythm, normal heart sounds and intact distal pulses. Exam reveals no gallop.  No murmur heard. Pulmonary/Chest: Effort normal and breath sounds normal. No respiratory distress.  Left parasternal tenderness present in 3rd and 4th costochondral junction  Abdominal: Soft. Bowel sounds are normal.  Musculoskeletal: Normal range of motion.        General: No edema.  Neurological: He is alert.  Skin: Skin is warm and dry.  Psychiatric: He has a normal mood and affect.    Radiology: No results found.  Laboratory Examination:  CMP Latest Ref Rng & Units 07/29/2017 03/15/2017 02/18/2017  Glucose 65 - 99 mg/dL 101(B) 510(C) 585(I)  BUN 6 - 20 mg/dL 11 7 8   Creatinine 0.61 - 1.24 mg/dL 7.78 2.42 3.53  Sodium 135 - 145 mmol/L 139 137 137  Potassium 3.5 - 5.1 mmol/L 3.5 4.2 3.9  Chloride 101 - 111 mmol/L 106 100(L) 103  CO2 22 - 32 mmol/L 21(L) 28 27  Calcium 8.9 - 10.3 mg/dL 9.0 9.5 9.1  Total Protein 6.5 - 8.1 g/dL - - 7.0  Total Bilirubin 0.3 - 1.2 mg/dL - - 1.3(H)  Alkaline Phos 38 - 126 U/L - - 54  AST 15 - 41 U/L - - 21  ALT 17 - 63 U/L - - 27   CBC  Latest Ref Rng & Units 07/29/2017 03/15/2017 02/18/2017  WBC 4.0 - 10.5 K/uL 7.0 5.4 5.5  Hemoglobin 13.0 - 17.0 g/dL 16.114.1 09.614.3 04.513.3  Hematocrit 39.0 - 52.0 % 41.5 41.7 40.2  Platelets 150 - 400 K/uL 209 270 208   Lipid Panel     Component Value Date/Time   CHOL 149 10/20/2016 0447   TRIG 135 10/20/2016 0447   HDL 25 (L) 10/20/2016 0447   CHOLHDL 6.0 10/20/2016 0447   VLDL 27 10/20/2016 0447   LDLCALC 97 10/20/2016 0447   HEMOGLOBIN A1C Lab Results  Component Value Date   HGBA1C 4.6 (L) 10/20/2016   MPG 85 10/20/2016   TSH No results for input(s): TSH in the last 8760 hours.  CARDIAC STUDIES:   TEE [10/27/2016]: Normal TEE. Negative for double contrast study for intra-cardiac or extracardiac shunting. Mild atheromatous changes of the thoracic aorta.  Nuclear stress test [12/28/2016]:  1. The resting electrocardiogram demonstrated normal sinus rhythm, normal resting conduction, no resting arrhythmias and normal rest repolarization. The stress electrocardiogram was normal. Patient exercised on Bruce protocol for 10:00 minutes and achieved 11.75 METS. Stress test terminated due to dyspnea, chest pain(6/10) and 85% MPHR achieved (Target HR >85%). Hypertensive both in rest and stress with peak BP of 190/92 mm Hg. 2. Left ventricular cavity is noted to be normal on the rest and stress studies.  SPECT images demonstrate homogeneous tracer distribution throughout the myocardium. Gated SPECT imaging demonstrates global hypokinesis The left ventricular ejection fraction was calculated to be 40%. There appeared to be inferior and latral hypokinesis. This in an intermediate risk study in view of excellent effort. Clinical correlation recommended.  Event Monitor 30 days 10/22/2016:  No symptoms reported.  Normal sinus rhythm.  No atrial fibrillation.  Assessment:    Musculoskeletal chest pain - Plan: EKG 12-Lead  Essential hypertension - Plan: amLODIPine-Valsartan-HCTZ (EXFORGE HCT) 5-160-25 MG TABS, Comprehensive metabolic panel  Mild hyperlipidemia - Plan: Lipid Profile  Palpitations  EKG 09/13/2018: Normal sinus rhythm at rate of 70 bpm, left axis deviation, poor R-wave progression, cannot exclude anteroseptal infarct old.  LVH.  No evidence of ischemia, normal QT interval. BORDERLINE  Recommendation:   Patient was concerned about the ongoing chest pain for the past 3-4 weeks, it is present all the time, chest pain appears clearly musculoskeletal and reproducible.  EKG also does not reveal any acute abnormality.  I simply reassured him.  He is discontinue taking his blood pressure medications are also cholesterol medication.  Primary prevention was again discussed extensively with the patient.  I have restarted him on Exforge HCT 5/160/25 mg every morning, I'll obtain a CMP along with lipid profile testing in 3-4 weeks and I would like to see him back in 6 weeks.  With regard to palpitations, he has had occasional palpitations lasting a few seconds to at most 2 minutes and do not suspect atrial fibrillation.  Weight loss again discussed.  Yates DecampJay Marine Lezotte, MD, [facility] 09/13/2018, 5:54 PM [facility] [place] Pager: [phone] Office: [phone] If no answer Cell [phone]

## 2018-09-16 ENCOUNTER — Other Ambulatory Visit: Payer: Self-pay | Admitting: Cardiology

## 2018-09-16 ENCOUNTER — Encounter: Payer: Self-pay | Admitting: Cardiology

## 2018-09-16 DIAGNOSIS — I10 Essential (primary) hypertension: Secondary | ICD-10-CM

## 2018-09-16 MED ORDER — AMLODIPINE BESYLATE 5 MG PO TABS: 5.0000 mg | ORAL_TABLET | Freq: Every day | ORAL | 2 refills | Status: DC

## 2018-09-16 MED ORDER — OLMESARTAN MEDOXOMIL-HCTZ 40-25 MG PO TABS: 1.0000 | ORAL_TABLET | ORAL | 2 refills | Status: DC

## 2018-09-20 ENCOUNTER — Other Ambulatory Visit: Payer: Self-pay

## 2018-09-20 DIAGNOSIS — I10 Essential (primary) hypertension: Secondary | ICD-10-CM

## 2018-10-24 ENCOUNTER — Encounter: Payer: Self-pay | Admitting: Cardiology

## 2018-10-24 ENCOUNTER — Ambulatory Visit (INDEPENDENT_AMBULATORY_CARE_PROVIDER_SITE_OTHER): Payer: BC Managed Care – PPO | Admitting: Cardiology

## 2018-10-24 ENCOUNTER — Other Ambulatory Visit: Payer: Self-pay

## 2018-10-24 VITALS — BP 116/81 | HR 84 | Temp 97.0°F | Ht 66.0 in | Wt 205.0 lb

## 2018-10-24 DIAGNOSIS — R0789 Other chest pain: Secondary | ICD-10-CM

## 2018-10-24 DIAGNOSIS — I10 Essential (primary) hypertension: Secondary | ICD-10-CM | POA: Diagnosis not present

## 2018-10-24 MED ORDER — OLMESARTAN MEDOXOMIL-HCTZ 40-25 MG PO TABS: 1.0000 | ORAL_TABLET | Freq: Every day | ORAL | 2 refills | Status: DC

## 2018-10-24 MED ORDER — AMLODIPINE BESYLATE 5 MG PO TABS: 5.0000 mg | ORAL_TABLET | Freq: Every day | ORAL | 2 refills | Status: DC

## 2018-10-24 NOTE — Progress Notes (Signed)
Subjective:  Primary Physician:  Henrine Screwshacker, Robert, MD  Patient ID: Jesse Ho, male    DOB: 05/01/1976, 43 y.o.   MRN: [id number]  Chief Complaint  Patient presents with  . Hypertension    6 week f/u    HPI: Jesse Ho  is a 43 y.o. male  with HLD, difficult to control hypertension on 10/19/16 with c/o intermittent L arm, facial and left leg numbness and weakness along with left sided chest pain with minimal exertion associated with dyspnea and drove to ER. MRI /MRA BRAIN negative. He was evaluated by neurology, Dr Roxy Mannsster. He felt symptoms were considered to be non-physiologic. He was also evaluated in the outpatient setting by neurology Dr. Tally DueYajun Yan. Patient had normal TEE except for minimal atheromatous changes in the thoracic aorta and normal event monitor.  Nuclear stress test on 12/28/2016 had revealed global hypokinesis without evidence of ischemia, EF 40 to 45%.  Felt to be hypertensive changes.  He called our office to be seen on an urgent basis today stating that he has been having chest pain for the past 1 month, described as tightness to sharp pain in the left side of the chest.  It is present continuously.  No exacerbating or relieving factors.  He was seen by me about a month ago with chest pain suggestive of muscular skeletal etiology and he had discontinued all his blood pressure medications and was feeling poorly along with dyspnea.  I had started him on valsartan HCT along with amlodipine which he is tolerating and states that he feels the best he has in quite a while and he has been compliant with his medications.  Past Medical History:  Diagnosis Date  . Angina pectoris (HCC) 09/13/2018  . Arthritis    played foot ball  . Cervical dystonia   . CVA (cerebral vascular accident) (HCC)    mini stroke  . Essential hypertension 09/13/2018  . Mild hyperlipidemia 09/13/2018  . Palpitations 09/13/2018    Past Surgical History:  Procedure Laterality Date  . CYSTECTOMY  Left 10/19/2014   left branchial cleft cyst removed  . FRACTURE SURGERY     fx fingers on left hand playing football   and bil L wrist   . INSERTION OF MESH N/A 03/19/2017   Procedure: INSERTION OF MESH;  Surgeon: Gaynelle AduWilson, Eric, MD;  Location: [facility];  Service: General;  Laterality: N/A;  . TEE WITHOUT CARDIOVERSION N/A 10/27/2016   Procedure: TRANSESOPHAGEAL ECHOCARDIOGRAM (TEE);  Surgeon: Yates DecampGanji, Cienna Dumais, MD;  [facility];  Service: Cardiovascular;  Laterality: N/A;  . UMBILICAL HERNIA REPAIR N/A 03/19/2017   Procedure: LAPAROSCOPIC SUPRA UMBILICAL AND UMBILICAL HERNIA REPAIR WITH MESH;  Surgeon: Gaynelle AduWilson, Eric, MD;  Location: [facility];  Service: General;  Laterality: N/A;    Social History   Socioeconomic History  . Marital status: Married    Spouse name: Lowella Bandyikki  . Number of children: 2  . Years of education: 12+  . Highest education level: Not on file  Occupational History    Comment: [employer], designs websites  Social Needs  . Financial resource strain: Not on file  . Food insecurity:    Worry: Not on file    Inability: Not on file  . Transportation needs:    Medical: Not on file    Non-medical: Not on file  Tobacco Use  . Smoking status: Never Smoker  . Smokeless tobacco: Never Used  Substance and Sexual Activity  . Alcohol use: Yes  Comment: social  . Drug use: No  . Sexual activity: Yes  Lifestyle  . Physical activity:    Days per week: Not on file    Minutes per session: Not on file  . Stress: Not on file  Relationships  . Social connections:    Talks on phone: Not on file    Gets together: Not on file    Attends religious service: Not on file    Active member of club or organization: Not on file    Attends meetings of clubs or organizations: Not on file    Relationship status: Not on file  . Intimate partner violence:    Fear of current or ex partner: Not on file    Emotionally abused: Not on file    Physically abused: Not on file    Forced sexual  activity: Not on file  Other Topics Concern  . Not on file  Social History Narrative   Lives at home w/ his wife and children   Right-handed   Caffeine: 3 cups of coffee/tea    Current Outpatient Medications on File Prior to Visit  Medication Sig Dispense Refill  . amLODipine (NORVASC) 5 MG tablet Take 1 tablet (5 mg total) by mouth daily. 30 tablet 2  . olmesartan-hydrochlorothiazide (BENICAR HCT) 40-25 MG tablet Take 1 tablet by mouth daily.    . vitamin C (ASCORBIC ACID) 500 MG tablet Take 500 mg by mouth daily.     No current facility-administered medications on file prior to visit.    Review of Systems  Constitution: Negative for chills, decreased appetite, malaise/fatigue and weight gain.  Cardiovascular: Positive for chest pain and irregular heartbeat (occasional and may last for 5 min). Negative for dyspnea on exertion, leg swelling and syncope.  Endocrine: Negative for cold intolerance.  Hematologic/Lymphatic: Does not bruise/bleed easily.  Musculoskeletal: Negative for joint swelling.  Gastrointestinal: Negative for abdominal pain, anorexia and change in bowel habit.  Neurological: Negative for dizziness, headaches (mild), light-headedness and paresthesias.  Psychiatric/Behavioral: Negative for depression and substance abuse.  All other systems reviewed and are negative.     Objective:  Blood pressure 116/81, pulse 84, temperature (!) 97 F (36.1 C), height  (1.676 m), weight 205 lb (93 kg), SpO2 95 %. Body mass index is 33.09 kg/m.  Physical Exam  Constitutional: He appears well-nourished. No distress.  Moderately built and mildly obese  HENT:  Head: Atraumatic.  Eyes: Conjunctivae are normal.  Neck: Neck supple. No JVD present. No thyromegaly present.  Cardiovascular: Normal rate, regular rhythm, normal heart sounds and intact distal pulses. Exam reveals no gallop.  No murmur heard. Pulmonary/Chest: Effort normal and breath sounds normal. No respiratory  distress.  Left parasternal tenderness present in 3rd and 4th costochondral junction  Abdominal: Soft. Bowel sounds are normal.  Musculoskeletal: Normal range of motion.        General: No edema.  Neurological: He is alert.  Skin: Skin is warm and dry.  Psychiatric: He has a normal mood and affect.   Radiology: No results found.  Laboratory Examination:  CMP Latest Ref Rng & Units 07/29/2017 03/15/2017 02/18/2017  Glucose 65 - 99 mg/dL 409(W) 119(J) 478(G)  BUN 6 - 20 mg/dL Creatinine 0.61 - 1.24 mg/dL 9.56 2.13 0.86  Sodium 135 - 145 mmol/L 139 137 137  Potassium 3.5 - 5.1 mmol/L 3.5 4.2 3.9  Chloride 101 - 111 mmol/L 106 100(L) 103  CO2 22 - 32 mmol/L 21(L) 28 27  Calcium 8.9 - 10.3 mg/dL 9.0 9.5 9.1  Total Protein 6.5 - 8.1 g/dL - - 7.0  Total Bilirubin 0.3 - 1.2 mg/dL - - 1.3(H)  Alkaline Phos 38 - 126 U/L - - 54  AST 15 - 41 U/L - - 21  ALT 17 - 63 U/L - - 27   CBC Latest Ref Rng & Units 07/29/2017 03/15/2017 02/18/2017  WBC 4.0 - 10.5 K/uL 7.0 5.4 5.5  Hemoglobin 13.0 - 17.0 g/dL 73.6 68.1 59.4  Hematocrit 39.0 - 52.0 % 41.5 41.7 40.2  Platelets 150 - 400 K/uL 209 270 208   Lipid Panel     Component Value Date/Time   CHOL 149 10/20/2016 0447   TRIG 135 10/20/2016 0447   HDL 25 (L) 10/20/2016 0447   CHOLHDL 6.0 10/20/2016 0447   VLDL 27 10/20/2016 0447   LDLCALC 97 10/20/2016 0447   HEMOGLOBIN A1C Lab Results  Component Value Date   HGBA1C 4.6 (L) 10/20/2016   MPG 85 10/20/2016   TSH No results for input(s): TSH in the last 8760 hours.  CARDIAC STUDIES:   TEE [10/27/2016]: Normal TEE. Negative for double contrast study for intra-cardiac or extracardiac shunting. Mild atheromatous changes of the thoracic aorta.  Nuclear stress test [12/28/2016]:  1. The resting electrocardiogram demonstrated normal sinus rhythm, normal resting conduction, no resting arrhythmias and normal rest repolarization. The stress electrocardiogram was normal. Patient exercised  on Bruce protocol for 10:00 minutes and achieved 11.75 METS. Stress test terminated due to dyspnea, chest pain(6/10) and 85% MPHR achieved (Target HR >85%). Hypertensive both in rest and stress with peak BP of 190/92 mm Hg. 2. Left ventricular cavity is noted to be normal on the rest and stress studies. SPECT images demonstrate homogeneous tracer distribution throughout the myocardium. Gated SPECT imaging demonstrates global hypokinesis The left ventricular ejection fraction was calculated to be 40%. There appeared to be inferior and latral hypokinesis. This in an intermediate risk study in view of excellent effort. Clinical correlation recommended.  Event Monitor 30 days 10/22/2016:  No symptoms reported.  Normal sinus rhythm.  No atrial fibrillation.  Assessment:    Essential hypertension  Musculoskeletal chest pain  EKG 09/13/2018: Normal sinus rhythm at rate of 70 bpm, left axis deviation, poor R-wave progression, cannot exclude anteroseptal infarct old.  LVH.  No evidence of ischemia, normal QT interval.  Recommendation:   Patient is presently doing well, she is tolerating amlodipine are also on losartan HCT with excellent control of blood pressure, he has noticed overall well-being.  He still has sharp pain but is much improved.  He is made up his mind to make lifestyle changes and to be compliant with his medications.  I would like to see him back in 6 months.  I reviewed his labs, essentially lipids are normal except for markedly reduced HDL, renal function is normal and he is not a diabetic.  Yates Decamp, MD, [facility] 10/24/2018, 3:54 PM [facility] [place] Pager: [phone] Office: [phone] If no answer Cell [phone]

## 2018-10-28 ENCOUNTER — Other Ambulatory Visit: Payer: Self-pay

## 2018-10-29 NOTE — Progress Notes (Signed)
Labs 10/21/2018: Serum glucose 142, BUN 13, creatinine 1.13, eGFR 91 mL, potassium 4.2, CMP otherwise normal.  Total cholesterol 170, triglycerides 262, HDL 27, LDL 91.

## 2018-11-05 DIAGNOSIS — L720 Epidermal cyst: Secondary | ICD-10-CM | POA: Diagnosis not present

## 2019-04-21 ENCOUNTER — Ambulatory Visit (INDEPENDENT_AMBULATORY_CARE_PROVIDER_SITE_OTHER): Payer: BC Managed Care – PPO | Admitting: Cardiology

## 2019-04-21 ENCOUNTER — Encounter: Payer: Self-pay | Admitting: Cardiology

## 2019-04-21 ENCOUNTER — Other Ambulatory Visit: Payer: Self-pay

## 2019-04-21 VITALS — BP 148/95 | HR 59 | Temp 98.0°F | Ht 66.0 in | Wt 201.0 lb

## 2019-04-21 DIAGNOSIS — I10 Essential (primary) hypertension: Secondary | ICD-10-CM | POA: Diagnosis not present

## 2019-04-21 DIAGNOSIS — E785 Hyperlipidemia, unspecified: Secondary | ICD-10-CM

## 2019-04-21 MED ORDER — AMLODIPINE BESYLATE 10 MG PO TABS: 10.0000 mg | ORAL_TABLET | Freq: Every day | ORAL | 0 refills | Status: DC

## 2019-04-21 MED ORDER — ROSUVASTATIN CALCIUM 10 MG PO TABS: 10.0000 mg | ORAL_TABLET | Freq: Every day | ORAL | 3 refills | Status: DC

## 2019-04-21 NOTE — Progress Notes (Signed)
Primary Physician/Referring:  Susy Frizzle, MD  Patient ID: Jesse Ho, male    DOB: January 15, 1976, 43 y.o.   MRN: [id number]  Chief Complaint  Patient presents with  . Hypertension  . Follow-up    refill meds   HPI:    Jesse Ho  is a 43 y.o.  with HLD, difficult to control hypertension on 10/19/16 with c/o intermittent L arm, facial and left leg numbness and weakness along with left sided chest pain with minimal exertion associated with dyspnea and drove to ER. MRI /MRA BRAIN negative. Jesse Ho was evaluated by neurology, symptoms were considered to be non-physiologic. Jesse Ho was also evaluated in the outpatient setting by neurology Dr. Janet Berlin. Patient had normal TEE except for minimal atheromatous changes in the thoracic aorta and normal event monitor.  Nuclear stress test on 12/28/2016 had revealed global hypokinesis without evidence of ischemia, EF 40 to 45%.  Felt to be hypertensive changes.  Jesse Ho has stopped taking all his medications when Jesse Ho was seen in Apr 2020 presenting with hypertension and atypical chest pain. Jesse Ho is now back on his BP medications and is tolerating this well. No further chest pain, has occasional palpitations. Otherwise asymptomatic.   Past Medical History:  Diagnosis Date  . Angina pectoris ([place]) 09/13/2018  . Arthritis    played foot ball  . Cervical dystonia   . CVA (cerebral vascular accident) ([place])    mini stroke  . Essential hypertension 09/13/2018  . Mild hyperlipidemia 09/13/2018  . Palpitations 09/13/2018   Past Surgical History:  Procedure Laterality Date  . CYSTECTOMY Left 10/19/2014   left branchial cleft cyst removed  . FRACTURE SURGERY     fx fingers on left hand playing football   and bil L wrist   . INSERTION OF MESH N/A 03/19/2017   Procedure: INSERTION OF MESH;  Surgeon: Greer Pickerel, MD;  Location: [facility];  Service: General;  Laterality: N/A;  . TEE WITHOUT CARDIOVERSION N/A 10/27/2016   Procedure: TRANSESOPHAGEAL ECHOCARDIOGRAM  (TEE);  Surgeon: Adrian Prows, MD;  Location: [facility];  Service: Cardiovascular;  Laterality: N/A;  . UMBILICAL HERNIA REPAIR N/A 03/19/2017   Procedure: LAPAROSCOPIC SUPRA UMBILICAL AND UMBILICAL HERNIA REPAIR WITH MESH;  Surgeon: Greer Pickerel, MD;  Location: [facility];  Service: General;  Laterality: N/A;   Social History   Socioeconomic History  . Marital status: Married    Spouse name: Lexine Baton  . Number of children: 2  . Years of education: 12+  . Highest education level: Not on file  Occupational History    Comment: [employer], designs websites  Social Needs  . Financial resource strain: Not on file  . Food insecurity    Worry: Not on file    Inability: Not on file  . Transportation needs    Medical: Not on file    Non-medical: Not on file  Tobacco Use  . Smoking status: Never Smoker  . Smokeless tobacco: Never Used  Substance and Sexual Activity  . Alcohol use: Yes    Comment: social  . Drug use: No  . Sexual activity: Yes  Lifestyle  . Physical activity    Days per week: Not on file    Minutes per session: Not on file  . Stress: Not on file  Relationships  . Social Herbalist on phone: Not on file    Gets together: Not on file    Attends religious service: Not on file    Active member of  club or organization: Not on file    Attends meetings of clubs or organizations: Not on file    Relationship status: Not on file  . Intimate partner violence    Fear of current or ex partner: Not on file    Emotionally abused: Not on file    Physically abused: Not on file    Forced sexual activity: Not on file  Other Topics Concern  . Not on file  Social History Narrative   Lives at home w/ his wife and children   Right-handed   Caffeine: 3 cups of coffee/tea   ROS  Review of Systems  Constitution: Negative for chills, decreased appetite, malaise/fatigue and weight gain.  Cardiovascular: Positive for palpitations (occasional). Negative for dyspnea on exertion,  leg swelling and syncope.  Endocrine: Negative for cold intolerance.  Hematologic/Lymphatic: Does not bruise/bleed easily.  Musculoskeletal: Negative for joint swelling.  Gastrointestinal: Negative for abdominal pain, anorexia, change in bowel habit, hematochezia and melena.  Neurological: Negative for headaches and light-headedness.  Psychiatric/Behavioral: Negative for depression and substance abuse.  All other systems reviewed and are negative.  Objective   Vitals with BMI 04/21/2019 10/24/2018 09/13/2018  Height 5' 6" 5' 6" 5' 6"  Weight 201 lbs 205 lbs 204 lbs 6 oz  BMI 32.46 00.4 59.97  Systolic 741 423 953  Diastolic 95 81 202  Pulse 59 84 66     Physical Exam  Constitutional:  Jesse Ho is well built and mildly obese in no acute distress.  HENT:  Head: Atraumatic.  Eyes: Conjunctivae are normal.  Neck: Neck supple. No JVD present. No thyromegaly present.  Cardiovascular: Normal rate, regular rhythm, normal heart sounds and intact distal pulses. Exam reveals no gallop.  No murmur heard. No leg edema, no JVD.  Pulmonary/Chest: Effort normal and breath sounds normal.  Abdominal: Soft. Bowel sounds are normal.  Musculoskeletal: Normal range of motion.  Neurological: Jesse Ho is alert.  Skin: Skin is warm and dry.  Psychiatric: Jesse Ho has a normal mood and affect.   Laboratory examination:   No results for input(s): NA, K, CL, CO2, GLUCOSE, BUN, CREATININE, CALCIUM, GFRNONAA, GFRAA in the last 8760 hours. CrCl cannot be calculated (Patient's most recent lab result is older than the maximum 21 days allowed.).  CMP Latest Ref Rng & Units 07/29/2017 03/15/2017 02/18/2017  Glucose 65 - 99 mg/dL 124(H) 101(H) 119(H)  BUN 6 - 20 mg/dL _0 Creatinine 0.61 - 1.24 mg/dL 1.20 1.08 1.16  Sodium 135 - 145 mmol/L 139 137 137  Potassium 3.5 - 5.1 mmol/L 3.5 4.2 3.9  Chloride 101 - 111 mmol/L 106 100(L) 103  CO2 22 - 32 mmol/L 21(L) 28 27  Calcium 8.9 - 10.3 mg/dL 9.0 9.5 9.1  Total Protein 6.5 -  8.1 g/dL - - 7.0  Total Bilirubin 0.3 - 1.2 mg/dL - - 1.3(H)  Alkaline Phos 38 - 126 U/L - - 54  AST 15 - 41 U/L - - 21  ALT 17 - 63 U/L - - 27   CBC Latest Ref Rng & Units 07/29/2017 03/15/2017 02/18/2017  WBC 4.0 - 10.5 K/uL 7.0 5.4 5.5  Hemoglobin 13.0 - 17.0 g/dL 14.1 14.3 13.3  Hematocrit 39.0 - 52.0 % 41.5 41.7 40.2  Platelets 150 - 400 K/uL 209 270 208   Lipid Panel     Component Value Date/Time   CHOL 149 10/20/2016 0447   TRIG 135 10/20/2016 0447   HDL 25 (L) 10/20/2016 0447   CHOLHDL 6.0  10/20/2016 0447   VLDL 27 10/20/2016 0447   LDLCALC 97 10/20/2016 0447   HEMOGLOBIN A1C Lab Results  Component Value Date   HGBA1C 4.6 (L) 10/20/2016   MPG 85 10/20/2016   TSH No results for input(s): TSH in the last 8760 hours. Medications and allergies   Allergies  Allergen Reactions  . Keflex [Cephalexin] Anaphylaxis     Current Outpatient Medications  Medication Instructions  . amLODipine (NORVASC) 10 mg, Oral, Daily  . olmesartan-hydrochlorothiazide (BENICAR HCT) 40-25 MG tablet 1 tablet, Oral, Daily  . rosuvastatin (CRESTOR) 10 mg, Oral, Daily  . vitamin C (ASCORBIC ACID) 500 mg, Oral, Daily    Radiology:  No results found. Cardiac Studies:   Echocardiogram 10/20/2016:  Left ventricle: The cavity size was normal. Wall thickness was   normal. Systolic function was normal. The estimated ejection   fraction was in the range of 60% to 65%. Wall motion was normal;   there were no regional wall motion abnormalities. Doppler   parameters are consistent with abnormal left ventricular   relaxation (grade 1 diastolic dysfunction). The E/e&' ratio is   between 8-15, suggesting indeterminate LV filling pressure. - Aorta: Mildly dilated aortic root. Aortic root dimension: 39 mm  TEE  [10/27/2016]: Normal LVEF. Negative for double contrast study for intra-cardiac or extracardiac shunting. Mild atheromatous changes of the thoracic aorta.  Nuclear stress test  [12/28/2016]:  1.  The resting electrocardiogram demonstrated normal sinus rhythm, normal resting conduction, no resting arrhythmias and normal rest repolarization. The stress electrocardiogram was normal. Patient exercised on Bruce protocol for 10:00 minutes and achieved 11.75 METS. Stress test terminated due to dyspnea, chest pain(6/10) and 85% MPHR achieved (Target HR >85%). Hypertensive both in rest and stress with peak BP of 190/92 mm Hg. 2. Left ventricular cavity is noted to be normal on the rest and stress studies. SPECT images demonstrate homogeneous tracer distribution throughout the myocardium. Gated SPECT imaging demonstrates global hypokinesis The left ventricular ejection fraction was calculated to be 40%. There appeared to be inferior and latral hypokinesis. This in an intermediate risk study in view of excellent effort. Clinical correlation recommended.  Event Monitor 30 days 10/22/2016:  No symptoms reported.  Normal sinus rhythm.  No atrial fibrillation.  Assessment     ICD-10-CM   1. Essential hypertension  I10 EKG 12-Lead    Ambulatory referral to Family Practice    amLODipine (NORVASC) 10 MG tablet    CMP14+EGFR    CMP14+EGFR  2. Mild hyperlipidemia  E78.5 Ambulatory referral to Family Practice    rosuvastatin (CRESTOR) 10 MG tablet    Lipoprotein A (LPA)    Lipid Panel With LDL/HDL Ratio    Lipid Panel With LDL/HDL Ratio    Lipoprotein A (LPA)    EKG 04/21/2019: Sinus bradycardia at rate of 58 bpm, normal axis, poor R-wave progression, cannot exclude anteroseptal infarct old.  Borderline voltage criteria for LVH.  Nonspecific inferior T normality. No significant change from  EKG 09/13/2018.  Recommendations:   Meds ordered this encounter  Medications  . amLODipine (NORVASC) 10 MG tablet    Sig: Take 1 tablet (10 mg total) by mouth daily.    Dispense:  90 tablet    Refill:  0  . rosuvastatin (CRESTOR) 10 MG tablet    Sig: Take 1 tablet (10 mg total) by mouth daily.    Dispense:  90  tablet    Refill:  3   Patient with uncontrolled hypertension, aortic atherosclerosis noted on TEE in  2018, mild hyperlipidemia presents for f/u. Jesse Ho has had episodes of non compliance, had stopped antihypertensives in Apr 2020, but recently has been compliant. Remains asymptomatic.  Primary prevention was again discussed extensively with the patient. Increase Amlodipine to 10 mg daily and Crestor 10 mg added. I'll obtain  lipid profile testing in 6 weeks and I would like to see him back in 6 weeks.    Jesse Ho does not have a PCP since Dr. Aura Dials moved to [facility], will refer to Margaretmary Eddy, MD as patient lives in [place].   Adrian Prows, MD, [facility] 04/22/2019, 8:46 AM [facility] [place] Pager: [phone] Office: [phone] If no answer Cell [phone]

## 2019-05-03 ENCOUNTER — Ambulatory Visit: Payer: BLUE CROSS/BLUE SHIELD | Admitting: Cardiology

## 2019-06-05 ENCOUNTER — Ambulatory Visit: Payer: BC Managed Care – PPO | Attending: Internal Medicine

## 2019-06-05 DIAGNOSIS — R238 Other skin changes: Secondary | ICD-10-CM

## 2019-06-05 DIAGNOSIS — U071 COVID-19: Secondary | ICD-10-CM

## 2019-06-06 ENCOUNTER — Ambulatory Visit: Payer: BC Managed Care – PPO | Admitting: Cardiology

## 2019-06-06 LAB — NOVEL CORONAVIRUS, NAA: SARS-CoV-2, NAA: NOT DETECTED

## 2019-06-20 ENCOUNTER — Ambulatory Visit: Payer: BC Managed Care – PPO | Attending: Internal Medicine

## 2019-06-20 DIAGNOSIS — Z20822 Contact with and (suspected) exposure to covid-19: Secondary | ICD-10-CM | POA: Insufficient documentation

## 2019-06-22 LAB — NOVEL CORONAVIRUS, NAA: SARS-CoV-2, NAA: NOT DETECTED

## 2019-07-10 ENCOUNTER — Ambulatory Visit: Payer: BC Managed Care – PPO | Attending: Internal Medicine

## 2020-03-06 ENCOUNTER — Other Ambulatory Visit: Payer: Self-pay | Admitting: Orthopedic Surgery

## 2020-03-06 DIAGNOSIS — M25841 Other specified joint disorders, right hand: Secondary | ICD-10-CM

## 2020-03-08 DIAGNOSIS — M79641 Pain in right hand: Secondary | ICD-10-CM | POA: Diagnosis not present

## 2020-03-20 ENCOUNTER — Ambulatory Visit
Admission: RE | Admit: 2020-03-20 | Discharge: 2020-03-20 | Disposition: A | Discharge status: Home / Self Care | Payer: BC Managed Care – PPO | Source: Ambulatory Visit | Attending: Orthopedic Surgery | Admitting: Orthopedic Surgery

## 2020-03-20 DIAGNOSIS — M19041 Primary osteoarthritis, right hand: Secondary | ICD-10-CM | POA: Diagnosis not present

## 2020-03-20 DIAGNOSIS — M25841 Other specified joint disorders, right hand: Secondary | ICD-10-CM

## 2020-03-20 DIAGNOSIS — M25441 Effusion, right hand: Secondary | ICD-10-CM | POA: Diagnosis not present

## 2020-03-20 DIAGNOSIS — M7989 Other specified soft tissue disorders: Secondary | ICD-10-CM | POA: Diagnosis not present

## 2020-03-26 DIAGNOSIS — R2231 Localized swelling, mass and lump, right upper limb: Secondary | ICD-10-CM | POA: Diagnosis not present

## 2020-03-28 ENCOUNTER — Other Ambulatory Visit: Payer: Self-pay | Admitting: Orthopedic Surgery

## 2020-03-28 DIAGNOSIS — R2231 Localized swelling, mass and lump, right upper limb: Secondary | ICD-10-CM

## 2020-04-19 ENCOUNTER — Ambulatory Visit
Admission: RE | Admit: 2020-04-19 | Discharge: 2020-04-19 | Disposition: A | Discharge status: Home / Self Care | Payer: BC Managed Care – PPO | Source: Ambulatory Visit | Attending: Orthopedic Surgery | Admitting: Orthopedic Surgery

## 2020-04-19 ENCOUNTER — Other Ambulatory Visit: Payer: Self-pay

## 2020-04-19 DIAGNOSIS — M1811 Unilateral primary osteoarthritis of first carpometacarpal joint, right hand: Secondary | ICD-10-CM | POA: Diagnosis not present

## 2020-04-19 DIAGNOSIS — R2231 Localized swelling, mass and lump, right upper limb: Secondary | ICD-10-CM

## 2020-04-19 DIAGNOSIS — Z9889 Other specified postprocedural states: Secondary | ICD-10-CM | POA: Diagnosis not present

## 2020-04-19 DIAGNOSIS — M19031 Primary osteoarthritis, right wrist: Secondary | ICD-10-CM | POA: Diagnosis not present

## 2020-04-19 MED ORDER — GADOBENATE DIMEGLUMINE 529 MG/ML IV SOLN
18.0000 mL | Freq: Once | INTRAVENOUS | Status: AC | PRN
  Administered 2020-04-19: 18 mL via INTRAVENOUS

## 2020-05-01 DIAGNOSIS — R2231 Localized swelling, mass and lump, right upper limb: Secondary | ICD-10-CM | POA: Diagnosis not present

## 2020-05-08 ENCOUNTER — Other Ambulatory Visit: Payer: Self-pay | Admitting: Orthopedic Surgery

## 2020-05-08 DIAGNOSIS — Z01818 Encounter for other preprocedural examination: Secondary | ICD-10-CM | POA: Diagnosis not present

## 2020-05-08 DIAGNOSIS — Z9229 Personal history of other drug therapy: Secondary | ICD-10-CM | POA: Diagnosis not present

## 2020-05-08 DIAGNOSIS — Z1211 Encounter for screening for malignant neoplasm of colon: Secondary | ICD-10-CM | POA: Diagnosis not present

## 2020-05-27 DIAGNOSIS — Z01818 Encounter for other preprocedural examination: Secondary | ICD-10-CM | POA: Diagnosis not present

## 2020-05-27 DIAGNOSIS — Z1211 Encounter for screening for malignant neoplasm of colon: Secondary | ICD-10-CM | POA: Diagnosis not present

## 2020-06-05 NOTE — Progress Notes (Signed)
Reviewed cardiac testing and office note from Dr Jacinto Halim on 04/21/2019 with Dr. Hyacinth Meeker. Pt will need to follow up with cardiology prior to surgery. LVM with Steward Drone at Dr. Merrilee Seashore office.

## 2020-06-10 ENCOUNTER — Other Ambulatory Visit (HOSPITAL_COMMUNITY): Payer: BC Managed Care – PPO

## 2020-06-12 DIAGNOSIS — K635 Polyp of colon: Secondary | ICD-10-CM | POA: Diagnosis not present

## 2020-06-13 ENCOUNTER — Ambulatory Visit (HOSPITAL_BASED_OUTPATIENT_CLINIC_OR_DEPARTMENT_OTHER)
Admission: RE | Admit: 2020-06-13 | Payer: BC Managed Care – PPO | Source: Home / Self Care | Admitting: Orthopedic Surgery

## 2020-06-13 ENCOUNTER — Encounter (HOSPITAL_BASED_OUTPATIENT_CLINIC_OR_DEPARTMENT_OTHER): Admission: RE | Payer: Self-pay | Source: Home / Self Care

## 2020-06-13 SURGERY — EXCISION MASS
Anesthesia: Choice | Laterality: Right

## 2020-06-17 DIAGNOSIS — Z20822 Contact with and (suspected) exposure to covid-19: Secondary | ICD-10-CM | POA: Diagnosis not present

## 2020-06-17 DIAGNOSIS — U071 COVID-19: Secondary | ICD-10-CM | POA: Diagnosis not present

## 2020-06-21 DIAGNOSIS — Z20822 Contact with and (suspected) exposure to covid-19: Secondary | ICD-10-CM | POA: Diagnosis not present

## 2020-07-17 ENCOUNTER — Other Ambulatory Visit: Payer: Self-pay | Admitting: Cardiology

## 2020-07-17 DIAGNOSIS — I10 Essential (primary) hypertension: Secondary | ICD-10-CM

## 2020-07-17 DIAGNOSIS — E785 Hyperlipidemia, unspecified: Secondary | ICD-10-CM

## 2020-07-18 ENCOUNTER — Other Ambulatory Visit: Payer: Self-pay | Admitting: Cardiology

## 2020-07-18 ENCOUNTER — Other Ambulatory Visit: Payer: Self-pay

## 2020-07-18 DIAGNOSIS — E785 Hyperlipidemia, unspecified: Secondary | ICD-10-CM

## 2020-07-18 DIAGNOSIS — I10 Essential (primary) hypertension: Secondary | ICD-10-CM

## 2020-07-18 MED ORDER — OLMESARTAN MEDOXOMIL-HCTZ 40-25 MG PO TABS: 1.0000 | ORAL_TABLET | Freq: Every day | ORAL | 0 refills | Status: DC

## 2020-07-18 MED ORDER — AMLODIPINE BESYLATE 10 MG PO TABS: 10.0000 mg | ORAL_TABLET | Freq: Every day | ORAL | 0 refills | Status: DC

## 2020-07-18 MED ORDER — ROSUVASTATIN CALCIUM 10 MG PO TABS: 10.0000 mg | ORAL_TABLET | Freq: Every day | ORAL | 0 refills | Status: DC

## 2020-07-23 DIAGNOSIS — M25562 Pain in left knee: Secondary | ICD-10-CM | POA: Diagnosis not present

## 2020-07-23 NOTE — Progress Notes (Deleted)
Primary Physician/Referring:  Donita Brooks, MD  Patient ID: Jesse Ho, male    DOB: 22-Jul-1975, 45 y.o.   MRN: [id number]  No chief complaint on file.  HPI:    Jesse Ho  is a 44 y.o.  with HLD, difficult to control hypertension. Patient had normal TEE except for minimal atheromatous changes in the thoracic aorta and normal event monitor. Nuclear stress test on 12/28/2016 had revealed global hypokinesis without evidence of ischemia, EF 40 to 45%.  Felt to be hypertensive changes.  He has stopped taking all his medications when he was seen in Apr 2020 presenting with hypertension and atypical chest pain. He is now back on his BP medications and is tolerating this well. No further chest pain, has occasional palpitations. Otherwise asymptomatic.   ***Patient was last seen in our office on 04/21/2019 by Dr. Jacinto Halim for management of hypertension hyperlipidemia.  At this time he was recommended follow-up in 6 weeks was unfortunately lost to follow-up in general.  Patient now presents for management of ***.   ***PCP? Why back?   Past Medical History:  Diagnosis Date  . Angina pectoris ([facility]) 09/13/2018  . Arthritis    played foot ball  . Cervical dystonia   . CVA (cerebral vascular accident) ([facility])    mini stroke  . Essential hypertension 09/13/2018  . Mild hyperlipidemia 09/13/2018  . Palpitations 09/13/2018   Past Surgical History:  Procedure Laterality Date  . CYSTECTOMY Left 10/19/2014   left branchial cleft cyst removed  . FRACTURE SURGERY     fx fingers on left hand playing football   and bil L wrist   . INSERTION OF MESH N/A 03/19/2017   Procedure: INSERTION OF MESH;  Surgeon: Gaynelle Adu, MD;  Location: [facility];  Service: General;  Laterality: N/A;  . TEE WITHOUT CARDIOVERSION N/A 10/27/2016   Procedure: TRANSESOPHAGEAL ECHOCARDIOGRAM (TEE);  Surgeon: Yates Decamp, MD;  [facility];  Service: Cardiovascular;  Laterality: N/A;  . UMBILICAL HERNIA REPAIR N/A  03/19/2017   Procedure: LAPAROSCOPIC SUPRA UMBILICAL AND UMBILICAL HERNIA REPAIR WITH MESH;  Surgeon: Gaynelle Adu, MD;  Location: [facility];  Service: General;  Laterality: N/A;   Social History   Tobacco Use  . Smoking status: Never Smoker  . Smokeless tobacco: Never Used  Substance Use Topics  . Alcohol use: Yes    Comment: social   Marital Status: Married  ROS  Review of Systems  Constitutional: Negative for chills, decreased appetite, malaise/fatigue and weight gain.  Cardiovascular: Positive for palpitations (occasional). Negative for dyspnea on exertion, leg swelling and syncope.  Endocrine: Negative for cold intolerance.  Hematologic/Lymphatic: Does not bruise/bleed easily.  Musculoskeletal: Negative for joint swelling.  Gastrointestinal: Negative for abdominal pain, anorexia, change in bowel habit, hematochezia and melena.  Neurological: Negative for headaches and light-headedness.  Psychiatric/Behavioral: Negative for depression and substance abuse.  All other systems reviewed and are negative.  Objective   Vitals with BMI 04/21/2019 10/24/2018 09/13/2018  Height 5\' 6"  5\' 6"  5\' 6"   Weight 201 lbs 205 lbs 204 lbs 6 oz  BMI 32.46 33.1 33.01  Systolic 148 116  Diastolic 95 81 103  Pulse 59 84 66     Physical Exam Constitutional:      Comments: He is well built and mildly obese in no acute distress.  HENT:     Head: Atraumatic.  Eyes:     Conjunctiva/sclera: Conjunctivae normal.  Neck:     Thyroid: No thyromegaly.  Vascular: No JVD.  Cardiovascular:     Rate and Rhythm: Normal rate and regular rhythm.     Pulses: Intact distal pulses.     Heart sounds: Normal heart sounds. No murmur heard. No gallop.      Comments: No leg edema, no JVD. Pulmonary:     Effort: Pulmonary effort is normal.     Breath sounds: Normal breath sounds.  Abdominal:     General: Bowel sounds are normal.     Palpations: Abdomen is soft.  Musculoskeletal:        General: Normal  range of motion.     Cervical back: Neck supple.  Skin:    General: Skin is warm and dry.  Neurological:     Mental Status: He is alert.    Laboratory examination:   No results for input(s): NA, K, CL, CO2, GLUCOSE, BUN, CREATININE, CALCIUM, GFRNONAA, GFRAA in the last 8760 hours. CrCl cannot be calculated (Patient's most recent lab result is older than the maximum 21 days allowed.).  CMP Latest Ref Rng & Units 07/29/2017 03/15/2017 02/18/2017  Glucose 65 - 99 mg/dL 833(A) 250(N) 397(Q)  BUN 6 - 20 mg/dL 11 7 8   Creatinine 0.61 - 1.24 mg/dL 7.34 1.93  Sodium 135 - 145 mmol/L 139 137 137  Potassium 3.5 - 5.1 mmol/L 3.5 4.2 3.9  Chloride 101 - 111 mmol/L 106 100(L) 103  CO2 22 - 32 mmol/L 21(L) 28 27  Calcium 8.9 - 10.3 mg/dL 9.0 9.5 9.1  Total Protein 6.5 - 8.1 g/dL - - 7.0  Total Bilirubin 0.3 - 1.2 mg/dL - - 1.3(H)  Alkaline Phos 38 - 126 U/L - - 54  AST 15 - 41 U/L - - 21  ALT 17 - 63 U/L - - 27   CBC Latest Ref Rng & Units 07/29/2017 03/15/2017 02/18/2017  WBC 4.0 - 10.5 K/uL 7.0 5.4 5.5  Hemoglobin 13.0 - 17.0 g/dL 02/20/2017 24.0 97.3  Hematocrit 39.0 - 52.0 % 41.5 41.7 40.2  Platelets 150 - 400 K/uL 209 270 208   Lipid Panel     Component Value Date/Time   CHOL 149 10/20/2016 0447   TRIG 135 10/20/2016 0447   HDL 25 (L) 10/20/2016 0447   CHOLHDL 6.0 10/20/2016 0447   VLDL 27 10/20/2016 0447   LDLCALC 97 10/20/2016 0447   HEMOGLOBIN A1C Lab Results  Component Value Date   HGBA1C 4.6 (L) 10/20/2016   MPG 85 10/20/2016   TSH No results for input(s): TSH in the last 8760 hours.   External Labs: ***  Medications and allergies   Allergies  Allergen Reactions  . Keflex [Cephalexin] Anaphylaxis     Current Outpatient Medications  Medication Instructions  . amLODipine (NORVASC) 10 mg, Oral, Daily  . olmesartan-hydrochlorothiazide (BENICAR HCT) 40-25 MG tablet 1 tablet, Oral, Daily  . rosuvastatin (CRESTOR) 10 mg, Oral, Daily  . vitamin C (ASCORBIC ACID) 500  mg, Oral, Daily    Radiology:  No results found.   Cardiac Studies:   Echocardiogram 10/20/2016:  Left ventricle: The cavity size was normal. Wall thickness was   normal. Systolic function was normal. The estimated ejection   fraction was in the range of 60% to 65%. Wall motion was normal;   there were no regional wall motion abnormalities. Doppler   parameters are consistent with abnormal left ventricular   relaxation (grade 1 diastolic dysfunction). The E/e&' ratio is   between 8-15, suggesting indeterminate LV filling pressure. - Aorta: Mildly dilated aortic  root. Aortic root dimension: 39 mm  TEE  [10/27/2016]: Normal LVEF. Negative for double contrast study for intra-cardiac or extracardiac shunting. Mild atheromatous changes of the thoracic aorta.  Nuclear stress test  [12/28/2016]:  1. The resting electrocardiogram demonstrated normal sinus rhythm, normal resting conduction, no resting arrhythmias and normal rest repolarization. The stress electrocardiogram was normal. Patient exercised on Bruce protocol for 10:00 minutes and achieved 11.75 METS. Stress test terminated due to dyspnea, chest pain(6/10) and 85% MPHR achieved (Target HR >85%). Hypertensive both in rest and stress with peak BP of 190/92 mm Hg. 2. Left ventricular cavity is noted to be normal on the rest and stress studies. SPECT images demonstrate homogeneous tracer distribution throughout the myocardium. Gated SPECT imaging demonstrates global hypokinesis The left ventricular ejection fraction was calculated to be 40%. There appeared to be inferior and latral hypokinesis. This in an intermediate risk study in view of excellent effort. Clinical correlation recommended.  Event Monitor 30 days 10/22/2016:  No symptoms reported.  Normal sinus rhythm.  No atrial fibrillation.   EKG   ***  EKG 04/21/2019: Sinus bradycardia at rate of 58 bpm, normal axis, poor R-wave progression, cannot exclude anteroseptal infarct old.   Borderline voltage criteria for LVH.  Nonspecific inferior T normality. No significant change from  EKG 09/13/2018.  Assessment     ICD-10-CM   1. Essential hypertension  I10   2. Mild hyperlipidemia  E78.5     No orders of the defined types were placed in this encounter.  There are no discontinued medications.  Recommendations:   Jesse Ho  is a 45 y.o. with HLD, difficult to control hypertension. Patient had normal TEE except for minimal atheromatous changes in the thoracic aorta and normal event monitor. Nuclear stress test on 12/28/2016 had revealed global hypokinesis without evidence of ischemia, EF 40 to 45%.  Felt to be hypertensive changes.  Patient with uncontrolled hypertension, aortic atherosclerosis noted on TEE in 2018, mild hyperlipidemia presents for f/u. He has had episodes of non compliance, had stopped antihypertensives in Apr 2020, but recently has been compliant. Remains asymptomatic.  Primary prevention was again discussed extensively with the patient. Increase Amlodipine to 10 mg daily and Crestor 10 mg added. I'll obtain  lipid profile testing in 6 weeks and I would like to see him back in 6 weeks.    He does not have a PCP since Dr. Henrine Screws moved to [facility], will refer to Gilmore Laroche, MD as patient lives in [place].   ***Patient presents for follow-up of hypertension hyperlipidemia, last seen in our office 04/21/2019.  ***

## 2020-07-24 ENCOUNTER — Ambulatory Visit: Payer: BC Managed Care – PPO | Admitting: Student

## 2020-07-24 DIAGNOSIS — I10 Essential (primary) hypertension: Secondary | ICD-10-CM

## 2020-07-24 DIAGNOSIS — E785 Hyperlipidemia, unspecified: Secondary | ICD-10-CM

## 2020-07-25 ENCOUNTER — Encounter: Payer: Self-pay | Admitting: Student

## 2020-07-25 ENCOUNTER — Ambulatory Visit (INDEPENDENT_AMBULATORY_CARE_PROVIDER_SITE_OTHER): Payer: BC Managed Care – PPO | Admitting: Student

## 2020-07-25 ENCOUNTER — Other Ambulatory Visit: Payer: Self-pay

## 2020-07-25 VITALS — BP 146/103 | HR 78 | Temp 98.2°F | Wt 204.0 lb

## 2020-07-25 DIAGNOSIS — E785 Hyperlipidemia, unspecified: Secondary | ICD-10-CM

## 2020-07-25 DIAGNOSIS — I10 Essential (primary) hypertension: Secondary | ICD-10-CM

## 2020-07-25 DIAGNOSIS — I119 Hypertensive heart disease without heart failure: Secondary | ICD-10-CM | POA: Diagnosis not present

## 2020-07-25 MED ORDER — OLMESARTAN MEDOXOMIL-HCTZ 40-25 MG PO TABS: 1.0000 | ORAL_TABLET | Freq: Every day | ORAL | 3 refills | Status: DC

## 2020-07-25 MED ORDER — ROSUVASTATIN CALCIUM 10 MG PO TABS: 10.0000 mg | ORAL_TABLET | Freq: Every day | ORAL | 3 refills | Status: DC

## 2020-07-25 NOTE — Progress Notes (Signed)
See my EKG reading

## 2020-07-25 NOTE — Patient Instructions (Signed)
Blood work at [facility] in 7-10 days.

## 2020-07-25 NOTE — Progress Notes (Addendum)
Primary Physician/Referring:  Alvia Groveidge, [facility]  Patient ID: Jesse Ho, male    DOB: 07/25/1975, 45 y.o.   MRN: [id number]  Chief Complaint  Patient presents with  . Hypertension  . Follow-up   HPI:    Jesse Ho  is a 45 y.o.  with HLD, difficult to control hypertension. Patient had normal TEE except for minimal atheromatous changes in the thoracic aorta and normal event monitor. Nuclear stress test on 12/28/2016 had revealed global hypokinesis without evidence of ischemia, EF 40 to 45%.  Felt to be hypertensive changes.  Patient was last seen in our office on 04/21/2019 by Dr. Jacinto HalimGanji for management of hypertension and hyperlipidemia.  At this time he was recommended follow-up in 6 weeks was unfortunately lost to follow-up in general.  Patient now presents for management of the same.  He has previously been on olmesartan/hydrochlorothiazide and amlodipine as well as rosuvastatin.  However patient states he has not been taking his medications for at least the last 2 months.  He has also not been following with his primary care provider on a regular basis, but does state he has an appointment scheduled in March.  Patient denies chest pain, palpitations, dizziness, syncope, near syncope.  He denies symptoms suggestive of TIA or CVA.  He does report intermittent headache over the last 2 weeks without focal neuro deficits.  Past Medical History:  Diagnosis Date  . Angina pectoris (HCC) 09/13/2018  . Arthritis    played foot ball  . Cervical dystonia   . CVA (cerebral vascular accident) (HCC)    mini stroke  . Essential hypertension 09/13/2018  . Mild hyperlipidemia 09/13/2018  . Palpitations 09/13/2018   Past Surgical History:  Procedure Laterality Date  . CYSTECTOMY Left 10/19/2014   left branchial cleft cyst removed  . FRACTURE SURGERY     fx fingers on left hand playing football   and bil L wrist   . INSERTION OF MESH N/A 03/19/2017   Procedure: INSERTION OF MESH;   Surgeon: Gaynelle AduWilson, Eric, MD;  Location: [facility];  Service: General;  Laterality: N/A;  . TEE WITHOUT CARDIOVERSION N/A 10/27/2016   Procedure: TRANSESOPHAGEAL ECHOCARDIOGRAM (TEE);  Surgeon: Yates DecampGanji, Jay, MD;  Location: [facility] ENDOSCOPY;  Service: Cardiovascular;  Laterality: N/A;  . UMBILICAL HERNIA REPAIR N/A 03/19/2017   Procedure: LAPAROSCOPIC SUPRA UMBILICAL AND UMBILICAL HERNIA REPAIR WITH MESH;  Surgeon: Gaynelle AduWilson, Eric, MD;  Location: [facility];  Service: General;  Laterality: N/A;   Social History   Tobacco Use  . Smoking status: Never Smoker  . Smokeless tobacco: Never Used  Substance Use Topics  . Alcohol use: Yes    Comment: social   Marital Status: Married  ROS  Review of Systems  Constitutional: Negative for chills, decreased appetite, malaise/fatigue and weight gain.  Cardiovascular: Negative for chest pain, claudication, dyspnea on exertion, leg swelling, near-syncope, orthopnea, palpitations, paroxysmal nocturnal dyspnea and syncope.  Respiratory: Negative for shortness of breath.   Endocrine: Negative for cold intolerance.  Hematologic/Lymphatic: Does not bruise/bleed easily.  Musculoskeletal: Negative for joint swelling.  Gastrointestinal: Negative for abdominal pain, anorexia, change in bowel habit, hematochezia and melena.  Neurological: Positive for headaches (intermittent ). Negative for dizziness, light-headedness and weakness.  Psychiatric/Behavioral: Negative for depression and substance abuse.  All other systems reviewed and are negative.  Objective   Vitals with BMI 07/25/2020 04/21/2019 10/24/2018  Height - 5\' 6"  5\' 6"   Weight 204 lbs 201 lbs 205 lbs  BMI - 32.46  33.1  Systolic 146 148 676  Diastolic 103 95 81  Pulse 78 59 84     Physical Exam Vitals reviewed.  Constitutional:      Comments: He is well built and mildly obese in no acute distress.  HENT:     Head: Normocephalic and atraumatic.  Eyes:     Conjunctiva/sclera: Conjunctivae normal.  Neck:      Thyroid: No thyromegaly.     Vascular: No JVD.  Cardiovascular:     Rate and Rhythm: Normal rate and regular rhythm.     Pulses: Intact distal pulses.          Carotid pulses are 2+ on the right side and 2+ on the left side.      Radial pulses are 2+ on the right side and 2+ on the left side.       Popliteal pulses are 2+ on the right side and 2+ on the left side.       Dorsalis pedis pulses are 2+ on the right side and 2+ on the left side.       Posterior tibial pulses are 2+ on the right side and 2+ on the left side.     Heart sounds: Normal heart sounds, S1 normal and S2 normal. No murmur heard. No gallop.      Comments: No leg edema, no JVD. Pulmonary:     Effort: Pulmonary effort is normal. No respiratory distress.     Breath sounds: Normal breath sounds. No wheezing, rhonchi or rales.  Abdominal:     General: Bowel sounds are normal.     Palpations: Abdomen is soft.  Musculoskeletal:        General: Normal range of motion.     Cervical back: Neck supple.     Right lower leg: No edema.     Left lower leg: No edema.  Skin:    General: Skin is warm and dry.     Capillary Refill: Capillary refill takes less than 2 seconds.  Neurological:     Mental Status: He is alert.    Laboratory examination:   No results for input(s): NA, K, CL, CO2, GLUCOSE, BUN, CREATININE, CALCIUM, GFRNONAA, GFRAA in the last 8760 hours. CrCl cannot be calculated (Patient's most recent lab result is older than the maximum 21 days allowed.).  CMP Latest Ref Rng & Units 07/29/2017 03/15/2017 02/18/2017  Glucose 65 - 99 mg/dL 720(N) 470(J) 628(Z)  BUN 6 - 20 mg/dL 11 7 8   Creatinine 0.61 - 1.24 mg/dL 6.62 9.47  Sodium 135 - 145 mmol/L 139 137 137  Potassium 3.5 - 5.1 mmol/L 3.5 4.2 3.9  Chloride 101 - 111 mmol/L 106 100(L) 103  CO2 22 - 32 mmol/L 21(L) 28 27  Calcium 8.9 - 10.3 mg/dL 9.0 9.5 9.1  Total Protein 6.5 - 8.1 g/dL - - 7.0  Total Bilirubin 0.3 - 1.2 mg/dL - - 1.3(H)  Alkaline Phos 38 -  126 U/L - - 54  AST 15 - 41 U/L - - 21  ALT 17 - 63 U/L - - 27   CBC Latest Ref Rng & Units 07/29/2017 03/15/2017 02/18/2017  WBC 4.0 - 10.5 K/uL 7.0 5.4 5.5  Hemoglobin 13.0 - 17.0 g/dL 02/20/2017 65.0 35.4  Hematocrit 39.0 - 52.0 % 41.5 41.7 40.2  Platelets 150 - 400 K/uL 209 270 208   Lipid Panel     Component Value Date/Time   CHOL 149 10/20/2016 0447   TRIG 135 10/20/2016 0447  HDL 25 (L) 10/20/2016 0447   CHOLHDL 6.0 10/20/2016 0447   VLDL 27 10/20/2016 0447   LDLCALC 97 10/20/2016 0447   HEMOGLOBIN A1C Lab Results  Component Value Date   HGBA1C 4.6 (L) 10/20/2016   MPG 85 10/20/2016   TSH No results for input(s): TSH in the last 8760 hours.   External Labs: None   Medications and allergies   Allergies  Allergen Reactions  . Keflex [Cephalexin] Anaphylaxis     Current Outpatient Medications  Medication Instructions  . gabapentin (NEURONTIN) 100 mg, Oral, Daily  . methylPREDNISolone (MEDROL DOSEPAK) 4 MG TBPK tablet Oral  . olmesartan-hydrochlorothiazide (BENICAR HCT) 40-25 MG tablet 1 tablet, Oral, Daily  . rosuvastatin (CRESTOR) 10 mg, Oral, Daily  . vitamin C (ASCORBIC ACID) 500 mg, Oral, Daily    Radiology:  No results found.   Cardiac Studies:   Echocardiogram 10/20/2016:  Left ventricle: The cavity size was normal. Wall thickness was   normal. Systolic function was normal. The estimated ejection   fraction was in the range of 60% to 65%. Wall motion was normal;   there were no regional wall motion abnormalities. Doppler   parameters are consistent with abnormal left ventricular   relaxation (grade 1 diastolic dysfunction). The E/e&' ratio is   between 8-15, suggesting indeterminate LV filling pressure. - Aorta: Mildly dilated aortic root. Aortic root dimension: 39 mm  TEE  [10/27/2016]: Normal LVEF. Negative for double contrast study for intra-cardiac or extracardiac shunting. Mild atheromatous changes of the thoracic aorta.  Nuclear stress test   [12/28/2016]:  1. The resting electrocardiogram demonstrated normal sinus rhythm, normal resting conduction, no resting arrhythmias and normal rest repolarization. The stress electrocardiogram was normal. Patient exercised on Bruce protocol for 10:00 minutes and achieved 11.75 METS. Stress test terminated due to dyspnea, chest pain(6/10) and 85% MPHR achieved (Target HR >85%). Hypertensive both in rest and stress with peak BP of 190/92 mm Hg. 2. Left ventricular cavity is noted to be normal on the rest and stress studies. SPECT images demonstrate homogeneous tracer distribution throughout the myocardium. Gated SPECT imaging demonstrates global hypokinesis The left ventricular ejection fraction was calculated to be 40%. There appeared to be inferior and latral hypokinesis. This in an intermediate risk study in view of excellent effort. Clinical correlation recommended.  Event Monitor 30 days 10/22/2016:  No symptoms reported.  Normal sinus rhythm.  No atrial fibrillation.   EKG   EKG 07/25/2020: Sinus rhythm at a rate of 64 bpm.  Left atrial enlargement.  Left axis, left anterior fascicular block..  Poor R wave progression, cannot exclude anteroseptal infarct old.  LVH with repolarization abnormality.  EKG 04/21/2019: Sinus bradycardia at rate of 58 bpm, normal axis, poor R-wave progression, cannot exclude anteroseptal infarct old.  Borderline voltage criteria for LVH.  Nonspecific inferior T normality. No significant change from  EKG 09/13/2018.  Assessment     ICD-10-CM   1. Essential hypertension  I10 EKG 12-Lead    olmesartan-hydrochlorothiazide (BENICAR HCT) 40-25 MG tablet    Basic metabolic panel    PCV ECHOCARDIOGRAM COMPLETE  2. Mild hyperlipidemia  E78.5 rosuvastatin (CRESTOR) 10 MG tablet    Lipid Panel With LDL/HDL Ratio  3. Hypertensive heart disease without heart failure  I11.9 Basic metabolic panel    CBC    PCV ECHOCARDIOGRAM COMPLETE    Meds ordered this encounter   Medications  . olmesartan-hydrochlorothiazide (BENICAR HCT) 40-25 MG tablet    Sig: Take 1 tablet by mouth daily.  Dispense:  30 tablet    Refill:  3  . rosuvastatin (CRESTOR) 10 MG tablet    Sig: Take 1 tablet (10 mg total) by mouth daily.    Dispense:  30 tablet    Refill:  3   Medications Discontinued During This Encounter  Medication Reason  . amLODipine (NORVASC) 10 MG tablet Patient has not taken in last 30 days  . olmesartan-hydrochlorothiazide (BENICAR HCT) 40-25 MG tablet Reorder  . rosuvastatin (CRESTOR) 10 MG tablet Reorder    Recommendations:   Jesse Ho  is a 45 y.o. with HLD, difficult to control hypertension. Patient had normal TEE except for minimal atheromatous changes in the thoracic aorta and normal event monitor. Nuclear stress test on 12/28/2016 had revealed global hypokinesis without evidence of ischemia, EF 40 to 45%.  Felt to be hypertensive changes.  Patient with uncontrolled hypertension, aortic atherosclerosis noted on TEE in 2018, mild hyperlipidemia presents for follow-up, last seen in our office 04/21/2019.  He has again stopped all of his cardiovascular medications and is requesting refills now.  Patient's blood pressure is elevated in the office today.  We will therefore restart olmesartan/hydrochlorothiazide 40/25 mg daily.  We will also resume rosuvastatin 10 mg daily.  As patient has had no recent labs done will obtain CBC, lipid profile testing, BMP in 7-10 days.  In view of patient's history of hypertensive heart disease as well as new left atrial enlargement noted on EKG compared to previous, will obtain echocardiogram at this time. Notably elevated blood pressure may be contributing to headaches, will reevaluate at next office visit.   Recommend patient reestablish with PCP.  Follow-up in 6 weeks, sooner if needed but if needed, for hypertension and hyperlipidemia.   Rayford Halsted, PA-C 07/25/2020, 1:38 PM Office:  [phone]  Addendum:  EKG over-read by Dr. Jacinto Halim, additionally noted inferior ischemia unchanged from previous:   EKG 07/25/2020: Sinus rhythm at a rate of 64 bpm.  Left atrial enlargement.  Left axis, left anterior fascicular block..  Poor R wave progression, cannot exclude anteroseptal infarct old.  Inferior ischemia. LVH. No significant change from prior EKG.

## 2020-07-26 NOTE — Progress Notes (Signed)
I see and have addended the note accordingly. Thank you.

## 2020-08-08 ENCOUNTER — Other Ambulatory Visit: Payer: Self-pay

## 2020-08-08 ENCOUNTER — Ambulatory Visit: Payer: BC Managed Care – PPO

## 2020-08-08 DIAGNOSIS — I119 Hypertensive heart disease without heart failure: Secondary | ICD-10-CM | POA: Diagnosis not present

## 2020-08-08 DIAGNOSIS — I10 Essential (primary) hypertension: Secondary | ICD-10-CM | POA: Diagnosis not present

## 2020-08-11 NOTE — Progress Notes (Signed)
Please notify patient echocardiogram is unchanged compared to previous.

## 2020-08-12 NOTE — Progress Notes (Signed)
Called and spoke with patient regarding his Echocardiogram results.

## 2020-08-16 ENCOUNTER — Other Ambulatory Visit: Payer: Self-pay | Admitting: Student

## 2020-08-16 ENCOUNTER — Other Ambulatory Visit: Payer: Self-pay | Admitting: Cardiology

## 2020-08-16 DIAGNOSIS — I10 Essential (primary) hypertension: Secondary | ICD-10-CM

## 2020-08-26 DIAGNOSIS — I119 Hypertensive heart disease without heart failure: Secondary | ICD-10-CM | POA: Diagnosis not present

## 2020-08-26 DIAGNOSIS — E785 Hyperlipidemia, unspecified: Secondary | ICD-10-CM | POA: Diagnosis not present

## 2020-08-26 DIAGNOSIS — I10 Essential (primary) hypertension: Secondary | ICD-10-CM | POA: Diagnosis not present

## 2020-08-28 LAB — CBC
Hematocrit: 46.1 % (ref 37.5–51.0)
Hemoglobin: 14.9 g/dL (ref 13.0–17.7)
MCH: 30.5 pg (ref 26.6–33.0)
MCHC: 32.3 g/dL (ref 31.5–35.7)
MCV: 94 fL (ref 79–97)
Platelets: 288 10*3/uL (ref 150–450)
RBC: 4.89 x10E6/uL (ref 4.14–5.80)
RDW: 13.3 % (ref 11.6–15.4)
WBC: 5.7 10*3/uL (ref 3.4–10.8)

## 2020-08-28 LAB — LIPID PANEL WITH LDL/HDL RATIO
Cholesterol, Total: 158 mg/dL (ref 100–199)
HDL: 28 mg/dL — ABNORMAL LOW (ref >39)
LDL Chol Calc (NIH): 104 mg/dL — ABNORMAL HIGH (ref 0–99)
LDL/HDL Ratio: 3.7 ratio — ABNORMAL HIGH (ref 0.0–3.6)
Triglycerides: 142 mg/dL (ref 0–149)
VLDL Cholesterol Cal: 26 mg/dL (ref 5–40)

## 2020-08-28 LAB — BASIC METABOLIC PANEL
BUN/Creatinine Ratio: 11 (ref 9–20)
BUN: 12 mg/dL (ref 6–24)
CO2: 22 mmol/L (ref 20–29)
Calcium: 9.5 mg/dL (ref 8.7–10.2)
Chloride: 99 mmol/L (ref 96–106)
Creatinine, Ser: 1.13 mg/dL (ref 0.76–1.27)
Glucose: 171 mg/dL — ABNORMAL HIGH (ref 65–99)
Potassium: 4.3 mmol/L (ref 3.5–5.2)
Sodium: 139 mmol/L (ref 134–144)
eGFR: 81 mL/min/{1.73_m2} (ref >59)

## 2020-08-30 DIAGNOSIS — Z0000 Encounter for general adult medical examination without abnormal findings: Secondary | ICD-10-CM | POA: Diagnosis not present

## 2020-08-30 DIAGNOSIS — R739 Hyperglycemia, unspecified: Secondary | ICD-10-CM | POA: Diagnosis not present

## 2020-08-30 NOTE — Progress Notes (Signed)
Please notify patient is renal function electrolytes are stable he may continue olmesartan/hydrochlorothiazide.  Will discuss lipid panel at next office visit.

## 2020-08-30 NOTE — Progress Notes (Signed)
Called patient, NA, LMAM

## 2020-09-03 NOTE — Progress Notes (Signed)
Spoke to patient he is aware

## 2020-09-04 NOTE — Progress Notes (Signed)
Primary Physician/Referring:  [facility]  Patient ID: Jesse Ho, male    DOB: 1975/12/21, 45 y.o.   MRN: [id number]  Chief Complaint  Patient presents with  . Hypertension  . Hyperlipidemia  . Follow-up    6 weeks    HPI:    Jesse Ho  is a 45 y.o.  with HLD, difficult to control hypertension. Patient had normal TEE except for minimal atheromatous changes in the thoracic aorta and normal event monitor. Nuclear stress test on 12/28/2016 had revealed global hypokinesis without evidence of ischemia, EF 40 to 45%.  Felt to be hypertensive changes.  Patient presents for 6-week follow-up of hypertension and hyperlipidemia.  At last visit patient again stopped all of his cardiovascular medications and requests refills.  At last visit restarted olmesartan/hydrochlorothiazide and rosuvastatin.  Repeat BMP remained stable with addition of hydrochlorothiazide/olmesartan.  Patient reports medication compliance, and is tolerating olmesartan/hydrochlorothiazide and rosuvastatin without issue.  Unfortunately he does not bring with him a written log of home blood pressure readings, however he reports an average of 155/78 mmHg.  He does continue to have intermittent headaches and occasional episodes of dizziness without focal neuro deficits.    Past Medical History:  Diagnosis Date  . Angina pectoris (HCC) 09/13/2018  . Arthritis    played foot ball  . Cervical dystonia   . CVA (cerebral vascular accident) (HCC)    mini stroke  . Essential hypertension 09/13/2018  . Mild hyperlipidemia 09/13/2018  . Palpitations 09/13/2018   Past Surgical History:  Procedure Laterality Date  . CYSTECTOMY Left 10/19/2014   left branchial cleft cyst removed  . FRACTURE SURGERY     fx fingers on left hand playing football   and bil L wrist   . INSERTION OF MESH N/A 03/19/2017   Procedure: INSERTION OF MESH;  Surgeon: Gaynelle Adu, MD;  Location: [facility];  Service: General;  Laterality:  N/A;  . TEE WITHOUT CARDIOVERSION N/A 10/27/2016   Procedure: TRANSESOPHAGEAL ECHOCARDIOGRAM (TEE);  Surgeon: Yates Decamp, MD;  Location: [facility] ENDOSCOPY;  Service: Cardiovascular;  Laterality: N/A;  . UMBILICAL HERNIA REPAIR N/A 03/19/2017   Procedure: LAPAROSCOPIC SUPRA UMBILICAL AND UMBILICAL HERNIA REPAIR WITH MESH;  Surgeon: Gaynelle Adu, MD;  Location: [facility];  Service: General;  Laterality: N/A;   Family History  Problem Relation Age of Onset  . Hyperlipidemia Other   . Hypertension Other   . Diabetes Other   . Obesity Other   . Diabetes Sister    Social History   Tobacco Use  . Smoking status: Never Smoker  . Smokeless tobacco: Never Used  Substance Use Topics  . Alcohol use: Yes    Comment: social   Marital Status: Married  ROS  Review of Systems  Constitutional: Negative for chills, decreased appetite, malaise/fatigue and weight gain.  Cardiovascular: Negative for chest pain, claudication, dyspnea on exertion, leg swelling, near-syncope, orthopnea, palpitations, paroxysmal nocturnal dyspnea and syncope.  Respiratory: Negative for shortness of breath.   Endocrine: Negative for cold intolerance.  Hematologic/Lymphatic: Does not bruise/bleed easily.  Musculoskeletal: Negative for joint swelling.  Gastrointestinal: Negative for abdominal pain, anorexia, change in bowel habit, hematochezia and melena.  Neurological: Positive for dizziness and headaches (intermittent ). Negative for light-headedness and weakness.  Psychiatric/Behavioral: Negative for depression and substance abuse.  All other systems reviewed and are negative.  Objective   Vitals with BMI 09/05/2020 07/25/2020 04/21/2019  Height 5\' 6"  - 5\' 6"   Weight 204 lbs 3 oz  204 lbs 201 lbs  BMI 32.97 - 32.46  Systolic 148 146 161148  Diastolic 86 103 95  Pulse 75 78 59     Physical Exam Vitals reviewed.  Constitutional:      Comments: He is well built and mildly obese in no acute distress.  HENT:     Head:  Normocephalic and atraumatic.  Neck:     Thyroid: No thyromegaly.     Vascular: No JVD.  Cardiovascular:     Rate and Rhythm: Normal rate and regular rhythm.     Pulses: Intact distal pulses.          Carotid pulses are 2+ on the right side and 2+ on the left side.      Radial pulses are 2+ on the right side and 2+ on the left side.       Popliteal pulses are 2+ on the right side and 2+ on the left side.       Dorsalis pedis pulses are 2+ on the right side and 2+ on the left side.       Posterior tibial pulses are 2+ on the right side and 2+ on the left side.     Heart sounds: Normal heart sounds, S1 normal and S2 normal. No murmur heard. No gallop.      Comments: No leg edema, no JVD. Pulmonary:     Effort: Pulmonary effort is normal. No respiratory distress.     Breath sounds: Normal breath sounds. No wheezing, rhonchi or rales.  Musculoskeletal:        General: Normal range of motion.     Cervical back: Neck supple.     Right lower leg: No edema.     Left lower leg: No edema.  Skin:    General: Skin is warm and dry.  Neurological:     Mental Status: He is alert.    Laboratory examination:   Recent Labs    08/26/20 1036  NA 139  K 4.3  CL 99  CO2 22  GLUCOSE 171*  BUN 12  CREATININE 1.13  CALCIUM 9.5   estimated creatinine clearance is 87 mL/min (by C-G formula based on SCr of 1.13 mg/dL).  CMP Latest Ref Rng & Units 08/26/2020 07/29/2017 03/15/2017  Glucose 65 - 99 mg/dL 096(E171(H) 454(U124(H) 981(X101(H)  BUN 6 - 24 mg/dL 12 11 7   Creatinine 0.76 - 1.27 mg/dL 9.141.13 7.821.20 9.561.08  Sodium 134 - 144 mmol/L 139 139 137  Potassium 3.5 - 5.2 mmol/L 4.3 3.5 4.2  Chloride 96 - 106 mmol/L 99 106 100(L)  CO2 20 - 29 mmol/L 22 21(L) 28  Calcium 8.7 - 10.2 mg/dL 9.5 9.0 9.5  Total Protein 6.5 - 8.1 g/dL - - -  Total Bilirubin 0.3 - 1.2 mg/dL - - -  Alkaline Phos 38 - 126 U/L - - -  AST 15 - 41 U/L - - -  ALT 17 - 63 U/L - - -   CBC Latest Ref Rng & Units 08/26/2020 07/29/2017 03/15/2017   WBC 3.4 - 10.8 x10E3/uL 5.7 7.0 5.4  Hemoglobin 13.0 - 17.7 g/dL 21.314.9 08.614.1 57.814.3  Hematocrit 37.5 - 51.0 % 46.1 41.5 41.7  Platelets 150 - 450 x10E3/uL 288 209 270   Lipid Panel     Component Value Date/Time   CHOL 158 08/26/2020 1036   TRIG 142 08/26/2020 1036   HDL 28 (L) 08/26/2020 1036   CHOLHDL 6.0 10/20/2016 0447   VLDL 27 10/20/2016 0447  LDLCALC 104 (H) 08/26/2020 1036   HEMOGLOBIN A1C Lab Results  Component Value Date   HGBA1C 4.6 (L) 10/20/2016   MPG 85 10/20/2016   TSH No results for input(s): TSH in the last 8760 hours.   External Labs: None   Medications and allergies   Allergies  Allergen Reactions  . Keflex [Cephalexin] Anaphylaxis     Current Outpatient Medications  Medication Instructions  . amLODipine (NORVASC) 10 mg, Oral, Daily  . gabapentin (NEURONTIN) 100 mg, Oral, Daily  . olmesartan-hydrochlorothiazide (BENICAR HCT) 40-25 MG tablet 1 tablet, Oral, Daily  . rosuvastatin (CRESTOR) 10 mg, Oral, Daily  . vitamin C (ASCORBIC ACID) 500 mg, Daily    Radiology:  No results found.   Cardiac Studies:   PCV ECHOCARDIOGRAM COMPLETE 08/08/2020 Normal LV systolic function with visual EF 60-65%. Left ventricle cavity is normal in size. Normal global wall motion. Mild to moderate left ventricular hypertrophy. Normal diastolic filling pattern, normal LAP. Mild (Grade I) mitral regurgitation. Mild tricuspid regurgitation. No evidence of pulmonary hypertension. Mild pulmonic regurgitation. No significant change compared to prior study dated 10/20/2016.  TEE  [10/27/2016]: Normal LVEF. Negative for double contrast study for intra-cardiac or extracardiac shunting. Mild atheromatous changes of the thoracic aorta.  Nuclear stress test  [12/28/2016]:  1. The resting electrocardiogram demonstrated normal sinus rhythm, normal resting conduction, no resting arrhythmias and normal rest repolarization. The stress electrocardiogram was normal. Patient exercised  on Bruce protocol for 10:00 minutes and achieved 11.75 METS. Stress test terminated due to dyspnea, chest pain(6/10) and 85% MPHR achieved (Target HR >85%). Hypertensive both in rest and stress with peak BP of 190/92 mm Hg. 2. Left ventricular cavity is noted to be normal on the rest and stress studies. SPECT images demonstrate homogeneous tracer distribution throughout the myocardium. Gated SPECT imaging demonstrates global hypokinesis The left ventricular ejection fraction was calculated to be 40%. There appeared to be inferior and latral hypokinesis. This in an intermediate risk study in view of excellent effort. Clinical correlation recommended.  Event Monitor 30 days 10/22/2016:  No symptoms reported.  Normal sinus rhythm.  No atrial fibrillation.   EKG   EKG 07/25/2020: Sinus rhythm at a rate of 64 bpm.  Left atrial enlargement.  Left axis, left anterior fascicular block..  Poor R wave progression, cannot exclude anteroseptal infarct old.  Inferior ischemia. LVH. No significant change from prior EKG.   EKG 04/21/2019: Sinus bradycardia at rate of 58 bpm, normal axis, poor R-wave progression, cannot exclude anteroseptal infarct old.  Borderline voltage criteria for LVH.  Nonspecific inferior T normality. No significant change from  EKG 09/13/2018.  Assessment     ICD-10-CM   1. Essential hypertension  I10 amLODipine (NORVASC) 10 MG tablet  2. Mild hyperlipidemia  E78.5   3. Hypertensive heart disease without heart failure  I11.9     Meds ordered this encounter  Medications  . amLODipine (NORVASC) 10 MG tablet    Sig: Take 1 tablet (10 mg total) by mouth daily.    Dispense:  30 tablet    Refill:  3    **Patient requests 30 days supply**   Medications Discontinued During This Encounter  Medication Reason  . methylPREDNISolone (MEDROL DOSEPAK) 4 MG TBPK tablet Error  . amLODipine (NORVASC) 10 MG tablet Reorder    Recommendations:   Jesse Ho  is a 45 y.o. with HLD,  difficult to control hypertension. Patient had normal TEE except for minimal atheromatous changes in the thoracic aorta and normal event  monitor. Nuclear stress test on 12/28/2016 had revealed global hypokinesis without evidence of ischemia, EF 40 to 45%.  Felt to be hypertensive changes.  Patient presents for 6-week follow-up of hypertension and hyperlipidemia.  At last visit patient again stopped all of his cardiovascular medications and requests refills.  At last visit restarted olmesartan/hydrochlorothiazide and rosuvastatin.  Repeat BMP remained stable with addition of hydrochlorothiazide/olmesartan.  Patient's blood pressure remains uncontrolled, will add amlodipine 10 mg daily as he was previously taking.  Patient will continue to monitor his blood pressure on a regular basis at home and keep a written log.  He will notify our office if blood pressure remains >130/80 mmHg.   Reviewed and discussed with patient regarding results of echocardiogram and recent lab work with patient, details above.  Patient's LDL is above goal, however he has recently resumed statin therapy.  We will plan to repeat lipid profile testing in approximately 3 months.  Counseled patient regarding importance of low-sodium and low-fat diet.  Discussed at length regarding diet and lifestyle modifications order to reduce cardiovascular risk, patient verbalized understanding and agreement.  Notably elevated blood pressure may be contributing to headaches, will reevaluate at next office visit.   Patient has recently reestablished with PCP.   Follow up in 8 weeks, sooner if needed, for hypertension and hypertensive heart disease.    Rayford Halsted, PA-C 09/05/2020, 10:00 AM Office: [phone]

## 2020-09-05 ENCOUNTER — Ambulatory Visit: Payer: BC Managed Care – PPO | Admitting: Student

## 2020-09-05 ENCOUNTER — Encounter: Payer: Self-pay | Admitting: Student

## 2020-09-05 ENCOUNTER — Other Ambulatory Visit: Payer: Self-pay

## 2020-09-05 VITALS — BP 148/86 | HR 75 | Temp 98.3°F | Resp 16 | Ht 66.0 in | Wt 204.2 lb

## 2020-09-05 DIAGNOSIS — E785 Hyperlipidemia, unspecified: Secondary | ICD-10-CM | POA: Diagnosis not present

## 2020-09-05 DIAGNOSIS — I10 Essential (primary) hypertension: Secondary | ICD-10-CM | POA: Diagnosis not present

## 2020-09-05 DIAGNOSIS — I119 Hypertensive heart disease without heart failure: Secondary | ICD-10-CM | POA: Diagnosis not present

## 2020-09-05 MED ORDER — AMLODIPINE BESYLATE 10 MG PO TABS: 10.0000 mg | ORAL_TABLET | Freq: Every day | ORAL | 3 refills | Status: DC

## 2020-09-05 NOTE — Patient Instructions (Signed)

## 2020-10-30 NOTE — Progress Notes (Deleted)
Primary Physician/Referring:  [facility]  Patient ID: Jesse Ho, male    DOB: 02-Jul-1975, 45 y.o.   MRN: [id number]  No chief complaint on file.  HPI:    Jesse Ho  is a 45 y.o.  with HLD, difficult to control hypertension. Patient had normal TEE except for minimal atheromatous changes in the thoracic aorta and normal event monitor. Nuclear stress test on 12/28/2016 had revealed global hypokinesis without evidence of ischemia, EF 40 to 45%.  Felt to be hypertensive changes.  ***Patient presents for 8-week follow-up of hypertension and hypertensive heart disease.  Last visit I added amlodipine 10 mg daily.   ***  Patient presents for 6-week follow-up of hypertension and hyperlipidemia.  At last visit patient again stopped all of his cardiovascular medications and requests refills.  At last visit restarted olmesartan/hydrochlorothiazide and rosuvastatin.  Repeat BMP remained stable with addition of hydrochlorothiazide/olmesartan.  Patient reports medication compliance, and is tolerating olmesartan/hydrochlorothiazide and rosuvastatin without issue.  Unfortunately he does not bring with him a written log of home blood pressure readings, however he reports an average of 155/78 mmHg.  He does continue to have intermittent headaches and occasional episodes of dizziness without focal neuro deficits.    Past Medical History:  Diagnosis Date  . Angina pectoris (HCC) 09/13/2018  . Arthritis    played foot ball  . Cervical dystonia   . CVA (cerebral vascular accident) (HCC)    mini stroke  . Essential hypertension 09/13/2018  . Mild hyperlipidemia 09/13/2018  . Palpitations 09/13/2018   Past Surgical History:  Procedure Laterality Date  . CYSTECTOMY Left 10/19/2014   left branchial cleft cyst removed  . FRACTURE SURGERY     fx fingers on left hand playing football   and bil L wrist   . INSERTION OF MESH N/A 03/19/2017   Procedure: INSERTION OF MESH;  Surgeon:  Gaynelle Adu, MD;  Location: [facility];  Service: General;  Laterality: N/A;  . TEE WITHOUT CARDIOVERSION N/A 10/27/2016   Procedure: TRANSESOPHAGEAL ECHOCARDIOGRAM (TEE);  Surgeon: Yates Decamp, MD;  Location: [facility];  Service: Cardiovascular;  Laterality: N/A;  . UMBILICAL HERNIA REPAIR N/A 03/19/2017   Procedure: LAPAROSCOPIC SUPRA UMBILICAL AND UMBILICAL HERNIA REPAIR WITH MESH;  Surgeon: Gaynelle Adu, MD;  Location: [facility];  Service: General;  Laterality: N/A;   Family History  Problem Relation Age of Onset  . Hyperlipidemia Other   . Hypertension Other   . Diabetes Other   . Obesity Other   . Diabetes Sister    Social History   Tobacco Use  . Smoking status: Never Smoker  . Smokeless tobacco: Never Used  Substance Use Topics  . Alcohol use: Yes    Comment: social   Marital Status: Married  ROS  Review of Systems  Constitutional: Negative for chills, decreased appetite, malaise/fatigue and weight gain.  Cardiovascular: Negative for chest pain, claudication, dyspnea on exertion, leg swelling, near-syncope, orthopnea, palpitations, paroxysmal nocturnal dyspnea and syncope.  Respiratory: Negative for shortness of breath.   Endocrine: Negative for cold intolerance.  Hematologic/Lymphatic: Does not bruise/bleed easily.  Musculoskeletal: Negative for joint swelling.  Gastrointestinal: Negative for abdominal pain, anorexia, change in bowel habit, hematochezia and melena.  Neurological: Positive for dizziness and headaches (intermittent ). Negative for light-headedness and weakness.  Psychiatric/Behavioral: Negative for depression and substance abuse.  All other systems reviewed and are negative.  Objective   Vitals with BMI 09/05/2020 07/25/2020 04/21/2019  Height 5\' 6"  - 5'  6"  Weight 204 lbs 3 oz 204 lbs 201 lbs  BMI 32.97 - 32.46  Systolic 148 146 094  Diastolic 86 103 95  Pulse 75 78 59     Physical Exam Vitals reviewed.  Constitutional:      Comments: He is well  built and mildly obese in no acute distress.  HENT:     Head: Normocephalic and atraumatic.  Neck:     Thyroid: No thyromegaly.     Vascular: No JVD.  Cardiovascular:     Rate and Rhythm: Normal rate and regular rhythm.     Pulses: Intact distal pulses.          Carotid pulses are 2+ on the right side and 2+ on the left side.      Radial pulses are 2+ on the right side and 2+ on the left side.       Popliteal pulses are 2+ on the right side and 2+ on the left side.       Dorsalis pedis pulses are 2+ on the right side and 2+ on the left side.       Posterior tibial pulses are 2+ on the right side and 2+ on the left side.     Heart sounds: Normal heart sounds, S1 normal and S2 normal. No murmur heard. No gallop.      Comments: No leg edema, no JVD. Pulmonary:     Effort: Pulmonary effort is normal. No respiratory distress.     Breath sounds: Normal breath sounds. No wheezing, rhonchi or rales.  Musculoskeletal:        General: Normal range of motion.     Cervical back: Neck supple.     Right lower leg: No edema.     Left lower leg: No edema.  Skin:    General: Skin is warm and dry.  Neurological:     Mental Status: He is alert.    Laboratory examination:   Recent Labs    08/26/20 1036  NA 139  K 4.3  CL 99  CO2 22  GLUCOSE 171*  BUN 12  CREATININE 1.13  CALCIUM 9.5   CrCl cannot be calculated (Patient's most recent lab result is older than the maximum 21 days allowed.).  CMP Latest Ref Rng & Units 08/26/2020 07/29/2017 03/15/2017  Glucose 65 - 99 mg/dL 709(G) 283(M) 629(U)  BUN 6 - 24 mg/dL 12 11 7   Creatinine 0.76 - 1.27 mg/dL 7.65 4.65  Sodium 134 - 144 mmol/L 139 139 137  Potassium 3.5 - 5.2 mmol/L 4.3 3.5 4.2  Chloride 96 - 106 mmol/L 99 106 100(L)  CO2 20 - 29 mmol/L 22 21(L) 28  Calcium 8.7 - 10.2 mg/dL 9.5 9.0 9.5  Total Protein 6.5 - 8.1 g/dL - - -  Total Bilirubin 0.3 - 1.2 mg/dL - - -  Alkaline Phos 38 - 126 U/L - - -  AST 15 - 41 U/L - - -  ALT 17  - 63 U/L - - -   CBC Latest Ref Rng & Units 08/26/2020 07/29/2017 03/15/2017  WBC 3.4 - 10.8 x10E3/uL 5.7 7.0 5.4  Hemoglobin 13.0 - 17.7 g/dL 05/15/2017 46.5 68.1  Hematocrit 37.5 - 51.0 % 46.1 41.5 41.7  Platelets 150 - 450 x10E3/uL 288 209 270   Lipid Panel     Component Value Date/Time   CHOL 158 08/26/2020 1036   TRIG 142 08/26/2020 1036   HDL 28 (L) 08/26/2020 1036   CHOLHDL 6.0 10/20/2016  0447   VLDL 27 10/20/2016 0447   LDLCALC 104 (H) 08/26/2020 1036   HEMOGLOBIN A1C Lab Results  Component Value Date   HGBA1C 4.6 (L) 10/20/2016   MPG 85 10/20/2016   TSH No results for input(s): TSH in the last 8760 hours.   External Labs: None   Medications and allergies   Allergies  Allergen Reactions  . Keflex [Cephalexin] Anaphylaxis     Current Outpatient Medications  Medication Instructions  . amLODipine (NORVASC) 10 mg, Oral, Daily  . gabapentin (NEURONTIN) 100 mg, Oral, Daily  . olmesartan-hydrochlorothiazide (BENICAR HCT) 40-25 MG tablet 1 tablet, Oral, Daily  . rosuvastatin (CRESTOR) 10 mg, Oral, Daily  . vitamin C (ASCORBIC ACID) 500 mg, Daily    Radiology:  No results found.   Cardiac Studies:   PCV ECHOCARDIOGRAM COMPLETE 08/08/2020 Normal LV systolic function with visual EF 60-65%. Left ventricle cavity is normal in size. Normal global wall motion. Mild to moderate left ventricular hypertrophy. Normal diastolic filling pattern, normal LAP. Mild (Grade I) mitral regurgitation. Mild tricuspid regurgitation. No evidence of pulmonary hypertension. Mild pulmonic regurgitation. No significant change compared to prior study dated 10/20/2016.  TEE  [10/27/2016]: Normal LVEF. Negative for double contrast study for intra-cardiac or extracardiac shunting. Mild atheromatous changes of the thoracic aorta.  Nuclear stress test  [12/28/2016]:  1. The resting electrocardiogram demonstrated normal sinus rhythm, normal resting conduction, no resting arrhythmias and normal rest  repolarization. The stress electrocardiogram was normal. Patient exercised on Bruce protocol for 10:00 minutes and achieved 11.75 METS. Stress test terminated due to dyspnea, chest pain(6/10) and 85% MPHR achieved (Target HR >85%). Hypertensive both in rest and stress with peak BP of 190/92 mm Hg. 2. Left ventricular cavity is noted to be normal on the rest and stress studies. SPECT images demonstrate homogeneous tracer distribution throughout the myocardium. Gated SPECT imaging demonstrates global hypokinesis The left ventricular ejection fraction was calculated to be 40%. There appeared to be inferior and latral hypokinesis. This in an intermediate risk study in view of excellent effort. Clinical correlation recommended.  Event Monitor 30 days 10/22/2016:  No symptoms reported.  Normal sinus rhythm.  No atrial fibrillation.   EKG   EKG 07/25/2020: Sinus rhythm at a rate of 64 bpm.  Left atrial enlargement.  Left axis, left anterior fascicular block..  Poor R wave progression, cannot exclude anteroseptal infarct old.  Inferior ischemia. LVH. No significant change from prior EKG.   EKG 04/21/2019: Sinus bradycardia at rate of 58 bpm, normal axis, poor R-wave progression, cannot exclude anteroseptal infarct old.  Borderline voltage criteria for LVH.  Nonspecific inferior T normality. No significant change from  EKG 09/13/2018.  Assessment   No diagnosis found.  No orders of the defined types were placed in this encounter.  There are no discontinued medications.  Recommendations:   Jesse Ho  is a 45 y.o. with HLD, difficult to control hypertension. Patient had normal TEE except for minimal atheromatous changes in the thoracic aorta and normal event monitor. Nuclear stress test on 12/28/2016 had revealed global hypokinesis without evidence of ischemia, EF 40 to 45%.  Felt to be hypertensive changes.  ***Patient presents for 8-week follow-up of hypertension and hypertensive heart disease.   Last visit I added amlodipine 10 mg daily.   ***repeat lipid panel in July.   ***  Patient presents for 6-week follow-up of hypertension and hyperlipidemia.  At last visit patient again stopped all of his cardiovascular medications and requests refills.  At last  visit restarted olmesartan/hydrochlorothiazide and rosuvastatin.  Repeat BMP remained stable with addition of hydrochlorothiazide/olmesartan.  Patient's blood pressure remains uncontrolled, will add amlodipine 10 mg daily as he was previously taking.  Patient will continue to monitor his blood pressure on a regular basis at home and keep a written log.  He will notify our office if blood pressure remains >130/80 mmHg.   Reviewed and discussed with patient regarding results of echocardiogram and recent lab work with patient, details above.  Patient's LDL is above goal, however he has recently resumed statin therapy.  We will plan to repeat lipid profile testing in approximately 3 months.  Counseled patient regarding importance of low-sodium and low-fat diet.  Discussed at length regarding diet and lifestyle modifications order to reduce cardiovascular risk, patient verbalized understanding and agreement.  Notably elevated blood pressure may be contributing to headaches, will reevaluate at next office visit.   Patient has recently reestablished with PCP.   Follow up in 8 weeks, sooner if needed, for hypertension and hypertensive heart disease.    Jesse HalstedCeleste C Xyler Terpening, PA-C 10/30/2020, 9:21 AM Office: [phone]

## 2020-10-31 ENCOUNTER — Ambulatory Visit: Payer: BC Managed Care – PPO | Admitting: Student

## 2020-10-31 DIAGNOSIS — I119 Hypertensive heart disease without heart failure: Secondary | ICD-10-CM

## 2020-10-31 DIAGNOSIS — I10 Essential (primary) hypertension: Secondary | ICD-10-CM

## 2020-10-31 DIAGNOSIS — E785 Hyperlipidemia, unspecified: Secondary | ICD-10-CM

## 2020-12-02 DIAGNOSIS — Z6834 Body mass index (BMI) 34.0-34.9, adult: Secondary | ICD-10-CM | POA: Diagnosis not present

## 2020-12-02 DIAGNOSIS — R0602 Shortness of breath: Secondary | ICD-10-CM | POA: Diagnosis not present

## 2020-12-02 DIAGNOSIS — Z0000 Encounter for general adult medical examination without abnormal findings: Secondary | ICD-10-CM | POA: Diagnosis not present

## 2021-01-03 DIAGNOSIS — R635 Abnormal weight gain: Secondary | ICD-10-CM | POA: Diagnosis not present

## 2021-01-03 DIAGNOSIS — K5900 Constipation, unspecified: Secondary | ICD-10-CM | POA: Diagnosis not present

## 2021-02-03 DIAGNOSIS — R635 Abnormal weight gain: Secondary | ICD-10-CM | POA: Diagnosis not present

## 2021-06-25 ENCOUNTER — Ambulatory Visit: Payer: BC Managed Care – PPO | Admitting: Podiatry

## 2021-09-17 ENCOUNTER — Other Ambulatory Visit: Payer: Self-pay | Admitting: Student

## 2021-09-17 DIAGNOSIS — I10 Essential (primary) hypertension: Secondary | ICD-10-CM

## 2021-12-31 ENCOUNTER — Ambulatory Visit: Payer: BC Managed Care – PPO | Admitting: Cardiology

## 2021-12-31 ENCOUNTER — Encounter: Payer: Self-pay | Admitting: Cardiology

## 2021-12-31 VITALS — BP 134/90 | HR 83 | Temp 97.6°F | Resp 16 | Ht 66.0 in | Wt 187.8 lb

## 2021-12-31 DIAGNOSIS — Z8673 Personal history of transient ischemic attack (TIA), and cerebral infarction without residual deficits: Secondary | ICD-10-CM

## 2021-12-31 DIAGNOSIS — I10 Essential (primary) hypertension: Secondary | ICD-10-CM | POA: Diagnosis not present

## 2021-12-31 DIAGNOSIS — E785 Hyperlipidemia, unspecified: Secondary | ICD-10-CM

## 2021-12-31 DIAGNOSIS — R072 Precordial pain: Secondary | ICD-10-CM

## 2021-12-31 MED ORDER — ASPIRIN 81 MG PO TBEC: 81.0000 mg | DELAYED_RELEASE_TABLET | Freq: Every day | ORAL | 12 refills | Status: DC

## 2021-12-31 MED ORDER — OLMESARTAN MEDOXOMIL-HCTZ 40-25 MG PO TABS: 1.0000 | ORAL_TABLET | Freq: Every day | ORAL | 3 refills | Status: DC

## 2021-12-31 MED ORDER — PRAVASTATIN SODIUM 40 MG PO TABS: 40.0000 mg | ORAL_TABLET | Freq: Every evening | ORAL | 0 refills | Status: DC

## 2021-12-31 NOTE — Progress Notes (Signed)
ID:  Jesse Ho, DOB June 13, 1975, MRN [id number]  PCP:  R[place], [place]  Cardiologist:  Rex Kras, DO, [facility] (established care 12/31/2021) Former Cardiology Providers: Dr. Adrian Prows, Lawerance Cruel, [place]  Date: 12/31/21 Last Visit: 09/05/2020  Chief Complaint  Patient presents with   Hypertension   Follow-up    HPI  Jesse Ho is a 46 y.o. African-American male who presents to the clinic for evaluation of Hypertension at the request of Ridge, [place]*. His past medical history and cardiovascular risk factors include: Hypertension, TIA, HLD.   Chest pain: Started a couple weeks ago, substernally located, radiating to the back, squeezing-like sensation, the index event lasted for 15 minutes, and symptoms self-limited.  Thereafter patient restarted his blood pressure medications which she had held for 6 to 7 months because he thought " I was doing all right."   He still has some residual discomfort, anterior chest wall, pressure-like feeling, nonexertional, does not resolve with resting, at times brought on by deep inspiration, nonpositional.  No sick contacts.  After restarting his home blood pressure medications patient states that his blood pressures around 811 mmHg systolic.  FUNCTIONAL STATUS: No structured exercise program or daily routine.   ALLERGIES: Allergies  Allergen Reactions   Keflex [Cephalexin] Anaphylaxis    MEDICATION LIST PRIOR TO VISIT: Current Meds  Medication Sig   aspirin EC 81 MG tablet Take 1 tablet (81 mg total) by mouth daily. Swallow whole.   pravastatin (PRAVACHOL) 40 MG tablet Take 1 tablet (40 mg total) by mouth every evening.   [DISCONTINUED] olmesartan-hydrochlorothiazide (BENICAR HCT) 40-25 MG tablet Take 1 tablet by mouth daily.     PAST MEDICAL HISTORY: Past Medical History:  Diagnosis Date   Angina pectoris ([place]) 09/13/2018   Arthritis    played foot ball   Cervical dystonia    CVA (cerebral  vascular accident) ([place])    mini stroke   Essential hypertension 09/13/2018   Mild hyperlipidemia 09/13/2018   Palpitations 09/13/2018    PAST SURGICAL HISTORY: Past Surgical History:  Procedure Laterality Date   CYSTECTOMY Left 10/19/2014   left branchial cleft cyst removed   FRACTURE SURGERY     fx fingers on left hand playing football   and bil L wrist    INSERTION OF MESH N/A 03/19/2017   Procedure: INSERTION OF MESH;  Surgeon: Greer Pickerel, MD;  Location: [facility];  Service: General;  Laterality: N/A;   TEE WITHOUT CARDIOVERSION N/A 10/27/2016   Procedure: TRANSESOPHAGEAL ECHOCARDIOGRAM (TEE);  Surgeon: Adrian Prows, MD;  Location: [place];  Service: Cardiovascular;  Laterality: N/A;   UMBILICAL HERNIA REPAIR N/A 03/19/2017   Procedure: LAPAROSCOPIC SUPRA UMBILICAL AND UMBILICAL HERNIA REPAIR WITH MESH;  Surgeon: Greer Pickerel, MD;  Location: [facility];  Service: General;  Laterality: N/A;    FAMILY HISTORY: The patient family history includes Diabetes in his sister and another family member; Hyperlipidemia in an other family member; Hypertension in an other family member; Obesity in an other family member.  SOCIAL HISTORY:  The patient  reports that he has never smoked. He has never used smokeless tobacco. He reports current alcohol use. He reports that he does not use drugs.  REVIEW OF SYSTEMS: Review of Systems  Cardiovascular:  Positive for chest pain. Negative for claudication, dyspnea on exertion, irregular heartbeat, leg swelling, near-syncope, orthopnea, palpitations, paroxysmal nocturnal dyspnea and syncope.  Respiratory:  Positive for shortness of breath.   Hematologic/Lymphatic: Negative for bleeding problem.  Musculoskeletal:  Negative for muscle cramps and myalgias.  Neurological:  Negative for dizziness and light-headedness.    PHYSICAL EXAM:    12/31/2021    8:59 AM 09/05/2020    8:55 AM 07/25/2020    9:39 AM  Vitals with BMI  Height _0  _1    Weight 187 lbs 13  oz 204 lbs 3 oz 204 lbs  BMI 41.63 84.53   Systolic 646 803 212  Diastolic 90 86 248  Pulse 83 75 78    Physical Exam  Constitutional: He appears healthy.  Neck: No JVD present.  Cardiovascular: Normal rate, regular rhythm, S1 normal, S2 normal, intact distal pulses and normal pulses. Exam reveals no gallop, no S3 and no S4.  No murmur heard. Pulmonary/Chest: Effort normal and breath sounds normal. No stridor. He has no wheezes. He has no rales.  Abdominal: Soft. Bowel sounds are normal. He exhibits no distension. There is no abdominal tenderness.  Musculoskeletal:        General: No edema. Normal range of motion.     Cervical back: Neck supple.  Neurological: He is alert and oriented to person, place, and time.  Skin: Skin is warm and moist.   CARDIAC DATABASE: EKG: 12/31/2021: Sinus rhythm, 74 bpm, LVH per voltage criteria, ST-T changes likely repolarization variant, nonspecific T wave abnormality.   Echocardiogram:  08/08/2020: Normal LV systolic function with visual EF 60-65%. Left ventricle cavity is normal in size. Normal global wall motion. Mild to moderate left ventricular hypertrophy. Normal diastolic filling pattern, normal LAP. Mild (Grade I) mitral regurgitation. Mild tricuspid regurgitation. No evidence of pulmonary hypertension. Mild pulmonic regurgitation. No significant change compared to prior study dated 10/20/2016.   Stress Testing: Nuclear stress test  [12/28/2016]:  1. The resting electrocardiogram demonstrated normal sinus rhythm, normal resting conduction, no resting arrhythmias and normal rest repolarization. The stress electrocardiogram was normal. Patient exercised on Bruce protocol for 10:00 minutes and achieved 11.75 METS. Stress test terminated due to dyspnea, chest pain(6/10) and 85% MPHR achieved (Target HR >85%). Hypertensive both in rest and stress with peak BP of 190/92 mm Hg. 2. Left ventricular cavity is noted to be normal on the rest and stress  studies. SPECT images demonstrate homogeneous tracer distribution throughout the myocardium. Gated SPECT imaging demonstrates global hypokinesis The left ventricular ejection fraction was calculated to be 40%. There appeared to be inferior and latral hypokinesis. This in an intermediate risk study in view of excellent effort. Clinical correlation recommended.   Heart Catheterization: None  LABORATORY DATA:    Latest Ref Rng & Units 08/26/2020   10:36 AM 07/29/2017    8:32 PM 03/15/2017    2:34 PM  CBC  WBC 3.4 - 10.8 x10E3/uL 5.7  7.0  5.4   Hemoglobin 13.0 - 17.7 g/dL 14.9  14.1  14.3   Hematocrit 37.5 - 51.0 % 46.1  41.5  41.7   Platelets 150 - 450 x10E3/uL 288  209  270        Latest Ref Rng & Units 08/26/2020   10:36 AM 07/29/2017    8:32 PM 03/15/2017    2:34 PM  CMP  Glucose 65 - 99 mg/dL 171  124  101   BUN 6 - 24 mg/dL _2 Creatinine 0.76 - 1.27 mg/dL 1.13  1.20  1.08   Sodium 134 - 144 mmol/L 139  139  137   Potassium 3.5 - 5.2 mmol/L 4.3  3.5  4.2  Chloride 96 - 106 mmol/L 99  106  100   CO2 20 - 29 mmol/L _0 Calcium 8.7 - 10.2 mg/dL 9.5  9.0  9.5     Lipid Panel  Lab Results  Component Value Date   CHOL 158 08/26/2020   HDL 28 (L) 08/26/2020   LDLCALC 104 (H) 08/26/2020   TRIG 142 08/26/2020   CHOLHDL 6.0 10/20/2016   No components found for: "NTPROBNP" No results for input(s): "PROBNP" in the last 8760 hours. No results for input(s): "TSH" in the last 8760 hours.  BMP No results for input(s): "NA", "K", "CL", "CO2", "GLUCOSE", "BUN", "CREATININE", "CALCIUM", "GFRNONAA", "GFRAA" in the last 8760 hours.  HEMOGLOBIN A1C Lab Results  Component Value Date   HGBA1C 4.6 (L) 10/20/2016   MPG 85 10/20/2016    IMPRESSION:    ICD-10-CM   1. Precordial pain  R07.2 PCV ECHOCARDIOGRAM COMPLETE    CT CARDIAC SCORING ([facility] LOCATIONS ONLY)    2. Essential hypertension  I10 EKG 12-Lead    olmesartan-hydrochlorothiazide (BENICAR HCT) 40-25 MG  tablet    3. Mild hyperlipidemia  E78.5 pravastatin (PRAVACHOL) 40 MG tablet    Lipid Panel With LDL/HDL Ratio    LDL cholesterol, direct    CMP14+EGFR    4. History of TIA (transient ischemic attack)  Z86.73 pravastatin (PRAVACHOL) 40 MG tablet    aspirin EC 81 MG tablet       RECOMMENDATIONS: Jesse Ho is a 46 y.o. African-American male whose past medical history and cardiac risk factors include: Hypertension and TIA.  Precordial pain Likely noncardiac. Likely exacerbated by uncontrolled hypertension. Echo will be ordered to evaluate for structural heart disease and left ventricular systolic function. Coronary artery calcium score for further risk stratification. Based on the coronary artery calcium score we will decide whether to proceed with coronary CTA versus MPI.  Of note patient will need an MPI given the LVH on surface ECG. Educated on seeking medical attention sooner by going to the closest ER via EMS if the symptoms increase in intensity, frequency, duration, or has typical chest pain as discussed in the office.  Patient verbalized understanding.  Essential hypertension Patient has a long history of benign essential hypertension and surface ECG notes LVH which is confirmed by echocardiography. Patient is educated on the importance of blood pressure management. Patient is educated that if blood pressure is uncontrolled for prolonged period of time it can contribute to worsening morbidity mortality and predispose the patient to comorbidities such as stroke, heart disease, and renal disease. Continue current antihypertensive medications. He is asked to keep a log of his blood pressures and to bring it in to either myself or PCP.  Mild hyperlipidemia Was on rosuvastatin which she discontinued due to myalgias. We will transition him to pravastatin 40 mg p.o. nightly Labs in 6 weeks to reevaluate lipids and LFTs.  History of TIA (transient ischemic attack) Start aspirin  81 mg p.o. daily. Statin therapy as discussed above. Educated on importance of blood pressure management. Educated importance of secondary prevention  FINAL MEDICATION LIST END OF ENCOUNTER: Meds ordered this encounter  Medications   pravastatin (PRAVACHOL) 40 MG tablet    Sig: Take 1 tablet (40 mg total) by mouth every evening.    Dispense:  90 tablet    Refill:  0   aspirin EC 81 MG tablet    Sig: Take 1 tablet (81 mg total) by mouth daily. Swallow whole.  Dispense:  30 tablet    Refill:  12   olmesartan-hydrochlorothiazide (BENICAR HCT) 40-25 MG tablet    Sig: Take 1 tablet by mouth daily.    Dispense:  30 tablet    Refill:  3    Medications Discontinued During This Encounter  Medication Reason   gabapentin (NEURONTIN) 100 MG capsule    vitamin C (ASCORBIC ACID) 500 MG tablet    rosuvastatin (CRESTOR) 10 MG tablet    amLODipine (NORVASC) 10 MG tablet    olmesartan-hydrochlorothiazide (BENICAR HCT) 40-25 MG tablet Reorder     Current Outpatient Medications:    aspirin EC 81 MG tablet, Take 1 tablet (81 mg total) by mouth daily. Swallow whole., Disp: 30 tablet, Rfl: 12   pravastatin (PRAVACHOL) 40 MG tablet, Take 1 tablet (40 mg total) by mouth every evening., Disp: 90 tablet, Rfl: 0   olmesartan-hydrochlorothiazide (BENICAR HCT) 40-25 MG tablet, Take 1 tablet by mouth daily., Disp: 30 tablet, Rfl: 3  Orders Placed This Encounter  Procedures   CT CARDIAC SCORING ([facility] LOCATIONS ONLY)   Lipid Panel With LDL/HDL Ratio   LDL cholesterol, direct   CMP14+EGFR   EKG 12-Lead   PCV ECHOCARDIOGRAM COMPLETE    There are no Patient Instructions on file for this visit.   --Continue cardiac medications as reconciled in final medication list. --Return in about 7 weeks (around 02/18/2022) for Reevaluation of, Chest pain, BP. or sooner if needed. --Continue follow-up with your primary care physician regarding the management of your other chronic comorbid conditions.  Patient's  questions and concerns were addressed to his satisfaction. He voices understanding of the instructions provided during this encounter.   This note was created using a voice recognition software as a result there may be grammatical errors inadvertently enclosed that do not reflect the nature of this encounter. Every attempt is made to correct such errors.  Rex Kras, [place], [facility]  Pager: [phone] Office: [phone]

## 2022-01-02 ENCOUNTER — Ambulatory Visit
Admission: RE | Admit: 2022-01-02 | Discharge: 2022-01-02 | Disposition: A | Discharge status: Home / Self Care | Payer: No Typology Code available for payment source | Source: Ambulatory Visit | Attending: Cardiology | Admitting: Cardiology

## 2022-01-02 ENCOUNTER — Other Ambulatory Visit: Payer: Self-pay | Admitting: Cardiology

## 2022-01-02 DIAGNOSIS — R072 Precordial pain: Secondary | ICD-10-CM

## 2022-01-03 LAB — COMPREHENSIVE METABOLIC PANEL
ALT: 28 IU/L (ref 0–44)
AST: 20 IU/L (ref 0–40)
Albumin/Globulin Ratio: 2 (ref 1.2–2.2)
Albumin: 5.1 g/dL (ref 4.1–5.1)
Alkaline Phosphatase: 69 IU/L (ref 44–121)
BUN/Creatinine Ratio: 10 (ref 9–20)
BUN: 13 mg/dL (ref 6–24)
Bilirubin Total: 1.2 mg/dL (ref 0.0–1.2)
CO2: 25 mmol/L (ref 20–29)
Calcium: 10.1 mg/dL (ref 8.7–10.2)
Chloride: 98 mmol/L (ref 96–106)
Creatinine, Ser: 1.27 mg/dL (ref 0.76–1.27)
Globulin, Total: 2.5 g/dL (ref 1.5–4.5)
Glucose: 137 mg/dL — ABNORMAL HIGH (ref 70–99)
Potassium: 4.5 mmol/L (ref 3.5–5.2)
Sodium: 138 mmol/L (ref 134–144)
Total Protein: 7.6 g/dL (ref 6.0–8.5)
eGFR: 70 mL/min/{1.73_m2} (ref >59)

## 2022-01-03 LAB — SPECIMEN STATUS REPORT

## 2022-01-03 LAB — LIPID PANEL WITH LDL/HDL RATIO
Cholesterol, Total: 169 mg/dL (ref 100–199)
HDL: 30 mg/dL — ABNORMAL LOW (ref >39)
LDL Chol Calc (NIH): 114 mg/dL — ABNORMAL HIGH (ref 0–99)
LDL/HDL Ratio: 3.8 ratio — ABNORMAL HIGH (ref 0.0–3.6)
Triglycerides: 141 mg/dL (ref 0–149)
VLDL Cholesterol Cal: 25 mg/dL (ref 5–40)

## 2022-01-03 LAB — LDL CHOLESTEROL, DIRECT: LDL Direct: 104 mg/dL — ABNORMAL HIGH (ref 0–99)

## 2022-01-04 ENCOUNTER — Other Ambulatory Visit: Payer: Self-pay | Admitting: Cardiology

## 2022-01-04 DIAGNOSIS — E785 Hyperlipidemia, unspecified: Secondary | ICD-10-CM

## 2022-01-04 DIAGNOSIS — R072 Precordial pain: Secondary | ICD-10-CM

## 2022-01-04 DIAGNOSIS — Z8673 Personal history of transient ischemic attack (TIA), and cerebral infarction without residual deficits: Secondary | ICD-10-CM

## 2022-01-04 MED ORDER — METOPROLOL TARTRATE 25 MG PO TABS: 25.0000 mg | ORAL_TABLET | Freq: Two times a day (BID) | ORAL | 0 refills | Status: DC

## 2022-01-04 NOTE — Progress Notes (Signed)
Given his CP will proceed w/ CCTA.  GXT not ideal due to LVH on surface EKG.  We discussed CCTA or MPI at the last office visit.  Will proceed w/ CCTA.  Lopressor ordered.  Spoke to the patient over the phone and he agrees.   Tessa Lerner, [place], [facility]  Pager: [phone] Office: [phone]

## 2022-01-12 DIAGNOSIS — I10 Essential (primary) hypertension: Secondary | ICD-10-CM | POA: Diagnosis not present

## 2022-01-12 DIAGNOSIS — Z131 Encounter for screening for diabetes mellitus: Secondary | ICD-10-CM | POA: Diagnosis not present

## 2022-01-12 DIAGNOSIS — Z0000 Encounter for general adult medical examination without abnormal findings: Secondary | ICD-10-CM | POA: Diagnosis not present

## 2022-01-12 DIAGNOSIS — Z1339 Encounter for screening examination for other mental health and behavioral disorders: Secondary | ICD-10-CM | POA: Diagnosis not present

## 2022-01-12 DIAGNOSIS — Z1331 Encounter for screening for depression: Secondary | ICD-10-CM | POA: Diagnosis not present

## 2022-01-25 ENCOUNTER — Other Ambulatory Visit: Payer: Self-pay | Admitting: Cardiology

## 2022-01-25 DIAGNOSIS — I10 Essential (primary) hypertension: Secondary | ICD-10-CM

## 2022-01-26 ENCOUNTER — Other Ambulatory Visit: Payer: Self-pay | Admitting: Cardiology

## 2022-01-26 DIAGNOSIS — R072 Precordial pain: Secondary | ICD-10-CM

## 2022-02-04 ENCOUNTER — Other Ambulatory Visit: Payer: BC Managed Care – PPO

## 2022-02-06 ENCOUNTER — Ambulatory Visit: Payer: BC Managed Care – PPO

## 2022-02-06 DIAGNOSIS — R1031 Right lower quadrant pain: Secondary | ICD-10-CM | POA: Diagnosis not present

## 2022-02-06 DIAGNOSIS — R072 Precordial pain: Secondary | ICD-10-CM

## 2022-02-06 DIAGNOSIS — R739 Hyperglycemia, unspecified: Secondary | ICD-10-CM | POA: Diagnosis not present

## 2022-02-06 DIAGNOSIS — I10 Essential (primary) hypertension: Secondary | ICD-10-CM | POA: Diagnosis not present

## 2022-02-06 DIAGNOSIS — E782 Mixed hyperlipidemia: Secondary | ICD-10-CM | POA: Diagnosis not present

## 2022-02-12 ENCOUNTER — Ambulatory Visit (INDEPENDENT_AMBULATORY_CARE_PROVIDER_SITE_OTHER): Payer: BC Managed Care – PPO

## 2022-02-12 ENCOUNTER — Ambulatory Visit: Payer: BC Managed Care – PPO | Admitting: Podiatry

## 2022-02-12 DIAGNOSIS — M7751 Other enthesopathy of right foot: Secondary | ICD-10-CM

## 2022-02-12 DIAGNOSIS — M10471 Other secondary gout, right ankle and foot: Secondary | ICD-10-CM | POA: Diagnosis not present

## 2022-02-12 DIAGNOSIS — M7750 Other enthesopathy of unspecified foot: Secondary | ICD-10-CM

## 2022-02-12 DIAGNOSIS — R600 Localized edema: Secondary | ICD-10-CM | POA: Diagnosis not present

## 2022-02-12 MED ORDER — METHYLPREDNISOLONE 4 MG PO TBPK: ORAL_TABLET | ORAL | 0 refills | Status: DC

## 2022-02-13 ENCOUNTER — Telehealth (HOSPITAL_COMMUNITY): Payer: Self-pay | Admitting: *Deleted

## 2022-02-13 LAB — CBC WITH DIFFERENTIAL/PLATELET
Basophils Absolute: 0 10*3/uL (ref 0.0–0.2)
Basos: 0 %
EOS (ABSOLUTE): 0 10*3/uL (ref 0.0–0.4)
Eos: 0 %
Hematocrit: 39.2 % (ref 37.5–51.0)
Hemoglobin: 13.1 g/dL (ref 13.0–17.7)
Immature Grans (Abs): 0.1 10*3/uL (ref 0.0–0.1)
Immature Granulocytes: 1 %
Lymphocytes Absolute: 0.9 10*3/uL (ref 0.7–3.1)
Lymphs: 13 %
MCH: 31.1 pg (ref 26.6–33.0)
MCHC: 33.4 g/dL (ref 31.5–35.7)
MCV: 93 fL (ref 79–97)
Monocytes Absolute: 0.2 10*3/uL (ref 0.1–0.9)
Monocytes: 3 %
Neutrophils Absolute: 5.3 10*3/uL (ref 1.4–7.0)
Neutrophils: 83 %
Platelets: 282 10*3/uL (ref 150–450)
RBC: 4.21 x10E6/uL (ref 4.14–5.80)
RDW: 13.4 % (ref 11.6–15.4)
WBC: 6.5 10*3/uL (ref 3.4–10.8)

## 2022-02-13 LAB — BASIC METABOLIC PANEL
BUN/Creatinine Ratio: 12 (ref 9–20)
BUN: 13 mg/dL (ref 6–24)
CO2: 23 mmol/L (ref 20–29)
Calcium: 9.8 mg/dL (ref 8.7–10.2)
Chloride: 100 mmol/L (ref 96–106)
Creatinine, Ser: 1.11 mg/dL (ref 0.76–1.27)
Glucose: 132 mg/dL — ABNORMAL HIGH (ref 70–99)
Potassium: 4.3 mmol/L (ref 3.5–5.2)
Sodium: 140 mmol/L (ref 134–144)
eGFR: 82 mL/min/{1.73_m2} (ref >59)

## 2022-02-13 LAB — ARTHRITIS PANEL
Anti Nuclear Antibody (ANA): NEGATIVE
Rheumatoid fact SerPl-aCnc: <10 IU/mL (ref <14.0)
Sed Rate: 2 mm/hr (ref 0–15)
Uric Acid: 7.3 mg/dL (ref 3.8–8.4)

## 2022-02-13 NOTE — Telephone Encounter (Signed)
Attempted to call patient regarding upcoming cardiac CT appointment. °Left message on voicemail with name and callback number ° °Hang Ammon RN Navigator Cardiac Imaging °[facility] °[phone] Office °[phone] Cell ° °

## 2022-02-16 ENCOUNTER — Ambulatory Visit: Payer: BC Managed Care – PPO | Admitting: Cardiology

## 2022-02-16 ENCOUNTER — Encounter: Payer: Self-pay | Admitting: Cardiology

## 2022-02-16 ENCOUNTER — Ambulatory Visit (HOSPITAL_COMMUNITY): Admission: RE | Admit: 2022-02-16 | Payer: BC Managed Care – PPO | Source: Ambulatory Visit

## 2022-02-16 VITALS — BP 113/67 | HR 71 | Temp 98.0°F | Resp 16 | Ht 66.0 in | Wt 194.0 lb

## 2022-02-16 DIAGNOSIS — Z8673 Personal history of transient ischemic attack (TIA), and cerebral infarction without residual deficits: Secondary | ICD-10-CM

## 2022-02-16 DIAGNOSIS — I10 Essential (primary) hypertension: Secondary | ICD-10-CM

## 2022-02-16 DIAGNOSIS — E785 Hyperlipidemia, unspecified: Secondary | ICD-10-CM | POA: Diagnosis not present

## 2022-02-16 DIAGNOSIS — R072 Precordial pain: Secondary | ICD-10-CM | POA: Diagnosis not present

## 2022-02-16 MED ORDER — ASPIRIN 81 MG PO TBEC: 81.0000 mg | DELAYED_RELEASE_TABLET | Freq: Every day | ORAL | 3 refills | Status: DC

## 2022-02-16 NOTE — Progress Notes (Signed)
  Subjective:  Patient ID: Jesse Ho, male    DOB: 27-Jun-1975,  MRN: [id number]  Chief Complaint  Patient presents with   Tendonitis    Right Ankle pain, unable to walk    46 y.o. male presents with the above complaint. History confirmed with patient.  Says the ankle has been painful and swollen for a couple weeks now worsening, no known traumatic injury that he is aware of  Objective:  Physical Exam: warm, good capillary refill, no trophic changes or ulcerative lesions, normal DP and PT pulses, normal sensory exam, and pain tenderness and edema to palpation of the anterior lateral right ankle.   Radiographs: Multiple views x-ray of right ankle: Joint effusion of anterior ankle joint, there are mild degenerative changes, loose body and medial ankle along deltoid Assessment:   1. Localized edema   2. Other secondary acute gout of right ankle      Plan:  Patient was evaluated and treated and all questions answered.  Given the clinical exam findings and radiographic findings.  In the absence of trauma I suspect likely this is an inflammatory arthropathy.  I discussed with him that gout would be the most likely etiology.  He does not know if he has a history of this.  Lab work to evaluate this was ordered.  Methylprednisolone taper was ordered for anti-inflammatory as well.  I also recommended corticosteroid injection.  Following sterile prep with Betadine the anterolateral ankle was injected with 5 mg Kenalog, 2 mg of dexamethasone and 1 cc of lidocaine 2% plain.  He tolerated this well. No follow-ups on file.

## 2022-02-16 NOTE — Progress Notes (Signed)
ID:  Jesse Ho, DOB 07-Oct-1975, MRN [id number]  PCP:  Ridge, [place]  Cardiologist:  Rex Kras, DO, [facility] (established care 12/31/2021) Former Cardiology Providers: Dr. Adrian Prows, Lawerance Cruel, [place]  Date: 02/16/22 Last Office Visit: 12/31/2021  Chief Complaint  Patient presents with   Chest Pain   Hypertension   Follow-up    HPI  Jesse Ho is a 46 y.o. African-American male whose  past medical history and cardiovascular risk factors include: Hypertension, TIA, HLD.   Initially referred to the practice for evaluation and management of benign essential hypertension but at the last office visit he was also complaining of symptoms of chest pain.  He was recommended to undergo echocardiogram and coronary CTA.  Coronary CTA is still pending.  Home blood pressures are better controlled on current medical therapy.    With improvement in his systolic blood pressures patient is no longer having any chest pain episodes.  At the last office visit he was recommended to have a coronary CTA but his out-of-pocket expense was approximately $700 and therefore will be scheduled in the upcoming months.  Echocardiogram notes preserved LVEF, normal diastolic function, no significant valvular heart disease.  And his total coronary calcium score is 1 placing him at the 78th percentile.  In the past he was intolerant to statin therapy and therefore he was transitioned to pravastatin.  He is tolerating the medication well without any myalgias.  Follow-up lipid profile and CMP is still pending.  ALLERGIES: Allergies  Allergen Reactions   Keflex [Cephalexin] Anaphylaxis    MEDICATION LIST PRIOR TO VISIT: Current Meds  Medication Sig   methylPREDNISolone (MEDROL DOSEPAK) 4 MG TBPK tablet 6 day dose pack - take as directed   metoprolol tartrate (LOPRESSOR) 25 MG tablet TAKE 1 TABLET BY MOUTH TWICE A DAY   olmesartan-hydrochlorothiazide (BENICAR HCT) 40-25 MG tablet TAKE 1  TABLET BY MOUTH EVERY DAY   pravastatin (PRAVACHOL) 40 MG tablet Take 1 tablet (40 mg total) by mouth every evening.     PAST MEDICAL HISTORY: Past Medical History:  Diagnosis Date   Angina pectoris ([place]) 09/13/2018   Arthritis    played foot ball   Cervical dystonia    CVA (cerebral vascular accident) ([place])    mini stroke   Essential hypertension 09/13/2018   Mild hyperlipidemia 09/13/2018   Palpitations 09/13/2018    PAST SURGICAL HISTORY: Past Surgical History:  Procedure Laterality Date   CYSTECTOMY Left 10/19/2014   left branchial cleft cyst removed   FRACTURE SURGERY     fx fingers on left hand playing football   and bil L wrist    INSERTION OF MESH N/A 03/19/2017   Procedure: INSERTION OF MESH;  Surgeon: Greer Pickerel, MD;  Location: [facility];  Service: General;  Laterality: N/A;   TEE WITHOUT CARDIOVERSION N/A 10/27/2016   Procedure: TRANSESOPHAGEAL ECHOCARDIOGRAM (TEE);  Surgeon: Adrian Prows, MD;  Location: [place];  Service: Cardiovascular;  Laterality: N/A;   UMBILICAL HERNIA REPAIR N/A 03/19/2017   Procedure: LAPAROSCOPIC SUPRA UMBILICAL AND UMBILICAL HERNIA REPAIR WITH MESH;  Surgeon: Greer Pickerel, MD;  Location: [facility];  Service: General;  Laterality: N/A;    FAMILY HISTORY: The patient family history includes Diabetes in his sister and another family member; Hyperlipidemia in an other family member; Hypertension in an other family member; Obesity in an other family member.  SOCIAL HISTORY:  The patient  reports that he has never smoked. He has never used smokeless tobacco.  He reports current alcohol use. He reports that he does not use drugs.  REVIEW OF SYSTEMS: Review of Systems  Cardiovascular:  Negative for chest pain, claudication, dyspnea on exertion, irregular heartbeat, leg swelling, near-syncope, orthopnea, palpitations, paroxysmal nocturnal dyspnea and syncope.  Respiratory:  Negative for shortness of breath.   Hematologic/Lymphatic: Negative for bleeding  problem.  Musculoskeletal:  Negative for muscle cramps and myalgias.  Neurological:  Negative for dizziness and light-headedness.    PHYSICAL EXAM:    02/16/2022    1:36 PM 12/31/2021    8:59 AM 09/05/2020    8:55 AM  Vitals with BMI  Height _0  _1  _2   Weight 194 lbs 187 lbs 13 oz 204 lbs 3 oz  BMI 31.33 75.88 32.54  Systolic 982 641 583  Diastolic 67 90 86  Pulse 71 83 75    Physical Exam  Constitutional: He appears healthy.  Neck: No JVD present.  Cardiovascular: Normal rate, regular rhythm, S1 normal, S2 normal, intact distal pulses and normal pulses. Exam reveals no gallop, no S3 and no S4.  No murmur heard. Pulmonary/Chest: Effort normal and breath sounds normal. No stridor. He has no wheezes. He has no rales.  Abdominal: Soft. Bowel sounds are normal. He exhibits no distension. There is no abdominal tenderness.  Musculoskeletal:        General: No edema. Normal range of motion.     Cervical back: Neck supple.  Neurological: He is alert and oriented to person, place, and time.  Skin: Skin is warm and moist.   CARDIAC DATABASE: EKG: 12/31/2021: Sinus rhythm, 74 bpm, LVH per voltage criteria, ST-T changes likely repolarization variant, nonspecific T wave abnormality.   Echocardiogram: 02/06/2022:  Normal LV systolic function with visual EF 60-65%. Left ventricle cavity is normal in size. Normal global wall motion. Mild to moderate left ventricular hypertrophy. Normal diastolic filling pattern, normal LAP.  Structurally normal tricuspid valve with trace regurgitation. No evidence of pulmonary hypertension.  Structurally normal pulmonic valve.  Mild pulmonic regurgitation.  no significant change compared to 08/2020.   Stress Testing: Nuclear stress test  [12/28/2016]:  1. The resting electrocardiogram demonstrated normal sinus rhythm, normal resting conduction, no resting arrhythmias and normal rest repolarization. The stress electrocardiogram was normal. Patient  exercised on Bruce protocol for 10:00 minutes and achieved 11.75 METS. Stress test terminated due to dyspnea, chest pain(6/10) and 85% MPHR achieved (Target HR >85%). Hypertensive both in rest and stress with peak BP of 190/92 mm Hg. 2. Left ventricular cavity is noted to be normal on the rest and stress studies. SPECT images demonstrate homogeneous tracer distribution throughout the myocardium. Gated SPECT imaging demonstrates global hypokinesis The left ventricular ejection fraction was calculated to be 40%. There appeared to be inferior and latral hypokinesis. This in an intermediate risk study in view of excellent effort. Clinical correlation recommended.   Heart Catheterization: None  CT Cardiac Scoring: 01/02/2022 Left Main: 1   LAD: 0   LCx: 0   RCA: 0   Total Agatston Score: 1   MESA database percentile: 78th   AORTA MEASUREMENTS:   Ascending Aorta: 3.2 cm   Descending Aorta: 2.7 cm  LABORATORY DATA:    Latest Ref Rng & Units 02/12/2022    3:16 PM 08/26/2020   10:36 AM 07/29/2017    8:32 PM  CBC  WBC 3.4 - 10.8 x10E3/uL 6.5  5.7  7.0   Hemoglobin 13.0 - 17.7 g/dL 13.1  14.9  14.1   Hematocrit 37.5 -  51.0 % 39.2  46.1  41.5   Platelets 150 - 450 x10E3/uL 282  288  209        Latest Ref Rng & Units 02/12/2022    3:16 PM 01/02/2022   10:31 AM 08/26/2020   10:36 AM  CMP  Glucose 70 - 99 mg/dL 132  137  171   BUN 6 - 24 mg/dL _0 Creatinine 0.76 - 1.27 mg/dL 1.11  1.27  1.13   Sodium 134 - 144 mmol/L 140  138  139   Potassium 3.5 - 5.2 mmol/L 4.3  4.5  4.3   Chloride 96 - 106 mmol/L 100  98  99   CO2 20 - 29 mmol/L _1 Calcium 8.7 - 10.2 mg/dL 9.8  10.1  9.5   Total Protein 6.0 - 8.5 g/dL  7.6    Total Bilirubin 0.0 - 1.2 mg/dL  1.2    Alkaline Phos 44 - 121 IU/L  69    AST 0 - 40 IU/L  20    ALT 0 - 44 IU/L  28      Lipid Panel  Lab Results  Component Value Date   CHOL 169 01/02/2022   HDL 30 (L) 01/02/2022   LDLCALC 114 (H) 01/02/2022    LDLDIRECT 104 (H) 01/02/2022   TRIG 141 01/02/2022   CHOLHDL 6.0 10/20/2016   No components found for: "NTPROBNP" No results for input(s): "PROBNP" in the last 8760 hours. No results for input(s): "TSH" in the last 8760 hours.  BMP Recent Labs    01/02/22 1031 02/12/22 1516  NA 138 140  K 4.5 4.3  CL 98 100  CO2 25 23  GLUCOSE 137* 132*  BUN 13 13  CREATININE 1.27 1.11  CALCIUM 10.1 9.8    HEMOGLOBIN A1C Lab Results  Component Value Date   HGBA1C 4.6 (L) 10/20/2016   MPG 85 10/20/2016    IMPRESSION:    ICD-10-CM   1. Precordial pain  R07.2     2. Mild hyperlipidemia  E78.5 Lipid Panel With LDL/HDL Ratio    LDL cholesterol, direct    CMP14+EGFR    3. Essential hypertension  I10     4. History of TIA (transient ischemic attack)  Z86.73 Lipid Panel With LDL/HDL Ratio    LDL cholesterol, direct    CMP14+EGFR    aspirin EC 81 MG tablet       RECOMMENDATIONS: Jesse Ho is a 46 y.o. African-American male whose past medical history and cardiac risk factors include: Hypertension and TIA.  Precordial pain Asymptomatic since last office visit. Likely secondary to uncontrolled hypertension in the past. Echocardiogram notes preserved LVEF without any significant valvular heart disease. Total coronary calcium score: 1AU, 78th percentile. We have discussed undergoing coronary CTA for further risk stratification.  However for now it is cost prohibitive and therefore this will postpone until he has the necessary needs to have the study done. Educated him on importance of improving his modifiable cardiovascular risk factors. If the patient is able to have a coronary CTA in the interim I would like to see him back after the test to go over the results as well as images.  Otherwise I will see him back on an annual basis or sooner if change in clinical status.  Mild hyperlipidemia Currently on pravastatin.   In the past has not tolerated rosuvastatin due to  myalgias Has been on pravastatin for greater  than 6 weeks.  We will check fasting lipid profile and CMP to evaluate lipids and LFTs respectively He denies myalgia or other side effects.  Essential hypertension Office blood pressures are very well controlled. Medications reconciled. No medication changes warranted at this time.  History of TIA (transient ischemic attack) Continue aspirin and statin therapy. Educated on the importance of improving his modifiable cardiovascular risk factors/secondary prevention.   FINAL MEDICATION LIST END OF ENCOUNTER: Meds ordered this encounter  Medications   aspirin EC 81 MG tablet    Sig: Take 1 tablet (81 mg total) by mouth daily. Swallow whole.    Dispense:  90 tablet    Refill:  3    Medications Discontinued During This Encounter  Medication Reason   aspirin EC 81 MG tablet Reorder     Current Outpatient Medications:    methylPREDNISolone (MEDROL DOSEPAK) 4 MG TBPK tablet, 6 day dose pack - take as directed, Disp: 21 tablet, Rfl: 0   metoprolol tartrate (LOPRESSOR) 25 MG tablet, TAKE 1 TABLET BY MOUTH TWICE A DAY, Disp: 60 tablet, Rfl: 0   olmesartan-hydrochlorothiazide (BENICAR HCT) 40-25 MG tablet, TAKE 1 TABLET BY MOUTH EVERY DAY, Disp: 90 tablet, Rfl: 2   pravastatin (PRAVACHOL) 40 MG tablet, Take 1 tablet (40 mg total) by mouth every evening., Disp: 90 tablet, Rfl: 0   aspirin EC 81 MG tablet, Take 1 tablet (81 mg total) by mouth daily. Swallow whole., Disp: 90 tablet, Rfl: 3  Orders Placed This Encounter  Procedures   Lipid Panel With LDL/HDL Ratio   LDL cholesterol, direct   CMP14+EGFR    There are no Patient Instructions on file for this visit.   --Continue cardiac medications as reconciled in final medication list. --Return in about 1 year (around 02/17/2023) for Annual follow up. or sooner if needed. --Continue follow-up with your primary care physician regarding the management of your other chronic comorbid  conditions.  Patient's questions and concerns were addressed to his satisfaction. He voices understanding of the instructions provided during this encounter.   This note was created using a voice recognition software as a result there may be grammatical errors inadvertently enclosed that do not reflect the nature of this encounter. Every attempt is made to correct such errors.  Rex Kras, [place], [facility]  Pager: [phone] Office: [phone]

## 2022-02-18 ENCOUNTER — Ambulatory Visit: Payer: BC Managed Care – PPO | Admitting: Cardiology

## 2022-02-18 ENCOUNTER — Encounter: Payer: Self-pay | Admitting: Podiatry

## 2022-02-19 ENCOUNTER — Ambulatory Visit: Payer: BC Managed Care – PPO | Admitting: Podiatrist

## 2022-02-19 ENCOUNTER — Encounter: Payer: Self-pay | Admitting: Podiatrist

## 2022-02-19 DIAGNOSIS — M25571 Pain in right ankle and joints of right foot: Secondary | ICD-10-CM | POA: Diagnosis not present

## 2022-02-19 DIAGNOSIS — M10471 Other secondary gout, right ankle and foot: Secondary | ICD-10-CM | POA: Diagnosis not present

## 2022-02-19 MED ORDER — COLCHICINE 0.6 MG PO TABS: 0.6000 mg | ORAL_TABLET | Freq: Every day | ORAL | 1 refills | Status: DC

## 2022-02-19 MED ORDER — METHYLPREDNISOLONE 4 MG PO TBPK: ORAL_TABLET | ORAL | 0 refills | Status: DC

## 2022-02-19 NOTE — Progress Notes (Unsigned)
Chief Complaint  Patient presents with   Ankle Pain    Follow up capsulitis/gout ankle right    "The medication and shot made it better, but its now started to hurt again and yesterday I had like these dents in my shin"     HPI: Patient is 46 y.o. male who presents today for follow up of right ankle pain- suspected gout. He relates the shot was beneficial however the pain returned and yesterday he noticed some swelling and dents in his shin which have now gone away.  He had his bloodwork done and uric acid was high normal of 7.3.     Allergies  Allergen Reactions   Keflex [Cephalexin] Anaphylaxis    Review of systems is negative except as noted in the HPI.  Denies nausea/ vomiting/ fevers/ chills or night sweats.   Denies difficulty breathing, denies calf pain or tenderness  Physical Exam  Patient is awake, alert, and oriented x 3.  In no acute distress.    Vascular status is intact with palpable pedal pulses DP and PT bilateral and capillary refill time less than 3 seconds bilateral.  No edema or erythema noted.   Neurological exam reveals epicritic and protective sensation grossly intact bilateral.   Dermatological exam reveals skin is supple and dry to bilateral feet.  No open lesions present.  No skin dents or abnormalities at his shins noted.    Musculoskeletal exam: Musculature intact with dorsiflexion, plantarflexion, inversion, eversion. Pain present in the ankle with palpation and with dorsiflexion and plantarflexion    Assessment:   ICD-10-CM   1. Right ankle pain, unspecified chronicity  M25.571     2. Other secondary acute gout of right ankle  M10.471 methylPREDNISolone (MEDROL DOSEPAK) 4 MG TBPK tablet       Plan: Discussed exam and treatment options.  Discussed his uric acid level is at the higher end of normal so gout could still be the cause.  Discussed we could do a joint aspiration, however, I would recommend waiting to see if he experiences another episode  of acute inflammation in the ankle.  At todays visit I recommended trying another medrol dose pack as well as colchicine to see if this helps with the pain.  Rx for each was written.  He will return for follow up if symptoms fail to resolve in 10-14 days.

## 2022-02-20 DIAGNOSIS — Z9889 Other specified postprocedural states: Secondary | ICD-10-CM | POA: Diagnosis not present

## 2022-02-20 DIAGNOSIS — R1011 Right upper quadrant pain: Secondary | ICD-10-CM | POA: Diagnosis not present

## 2022-02-20 DIAGNOSIS — Z8719 Personal history of other diseases of the digestive system: Secondary | ICD-10-CM | POA: Diagnosis not present

## 2022-02-25 ENCOUNTER — Other Ambulatory Visit: Payer: Self-pay | Admitting: Cardiology

## 2022-02-25 DIAGNOSIS — R072 Precordial pain: Secondary | ICD-10-CM

## 2022-02-26 ENCOUNTER — Encounter: Payer: Self-pay | Admitting: Podiatrist

## 2022-03-10 ENCOUNTER — Ambulatory Visit: Payer: BC Managed Care – PPO | Admitting: Podiatry

## 2022-03-14 ENCOUNTER — Other Ambulatory Visit: Payer: Self-pay | Admitting: Podiatrist

## 2022-03-16 NOTE — Telephone Encounter (Signed)
Please advise 

## 2022-03-23 ENCOUNTER — Other Ambulatory Visit: Payer: Self-pay | Admitting: Cardiology

## 2022-03-23 DIAGNOSIS — E785 Hyperlipidemia, unspecified: Secondary | ICD-10-CM

## 2022-03-23 DIAGNOSIS — Z8673 Personal history of transient ischemic attack (TIA), and cerebral infarction without residual deficits: Secondary | ICD-10-CM

## 2022-05-26 ENCOUNTER — Ambulatory Visit: Payer: BC Managed Care – PPO | Attending: Family Medicine | Admitting: Family Medicine

## 2022-05-26 ENCOUNTER — Encounter: Payer: Self-pay | Admitting: Family Medicine

## 2022-05-26 VITALS — BP 159/89 | HR 69 | Temp 98.1°F | Ht 66.0 in | Wt 195.0 lb

## 2022-05-26 DIAGNOSIS — I10 Essential (primary) hypertension: Secondary | ICD-10-CM

## 2022-05-26 DIAGNOSIS — Z1159 Encounter for screening for other viral diseases: Secondary | ICD-10-CM

## 2022-05-26 DIAGNOSIS — Z131 Encounter for screening for diabetes mellitus: Secondary | ICD-10-CM

## 2022-05-26 DIAGNOSIS — N528 Other male erectile dysfunction: Secondary | ICD-10-CM | POA: Diagnosis not present

## 2022-05-26 DIAGNOSIS — K5909 Other constipation: Secondary | ICD-10-CM

## 2022-05-26 DIAGNOSIS — Z23 Encounter for immunization: Secondary | ICD-10-CM

## 2022-05-26 DIAGNOSIS — R519 Headache, unspecified: Secondary | ICD-10-CM | POA: Diagnosis not present

## 2022-05-26 DIAGNOSIS — Z1211 Encounter for screening for malignant neoplasm of colon: Secondary | ICD-10-CM

## 2022-05-26 DIAGNOSIS — G459 Transient cerebral ischemic attack, unspecified: Secondary | ICD-10-CM

## 2022-05-26 MED ORDER — LUBIPROSTONE 8 MCG PO CAPS: 8.0000 ug | ORAL_CAPSULE | Freq: Two times a day (BID) | ORAL | 5 refills | Status: DC

## 2022-05-26 MED ORDER — FLUTICASONE PROPIONATE 50 MCG/ACT NA SUSP: 2.0000 | Freq: Every day | NASAL | 6 refills | Status: DC

## 2022-05-26 MED ORDER — CETIRIZINE HCL 10 MG PO TABS: 10.0000 mg | ORAL_TABLET | Freq: Every day | ORAL | 1 refills | Status: DC

## 2022-05-26 MED ORDER — SILDENAFIL CITRATE 50 MG PO TABS: 50.0000 mg | ORAL_TABLET | Freq: Every day | ORAL | 0 refills | Status: DC | PRN

## 2022-05-26 NOTE — Patient Instructions (Addendum)
Erectile Dysfunction Erectile dysfunction (ED) is the inability to get or keep an erection in order to have sexual intercourse. ED is considered a symptom of an underlying disorder and is not considered a disease. ED may include: Inability to get an erection. Lack of enough hardness of the erection to allow penetration. Loss of erection before sex is finished. What are the causes? This condition may be caused by: Physical causes, such as: Artery problems. This may include heart disease, high blood pressure, atherosclerosis, and diabetes. Hormonal problems, such as low testosterone. Obesity. Nerve problems. This may include back or pelvic injuries, multiple sclerosis, Parkinson's disease, spinal cord injury, and stroke. Certain medicines, such as: Pain relievers. Antidepressants. Blood pressure medicines and water pills (diuretics). Cancer medicines. Antihistamines. Muscle relaxants. Lifestyle factors, such as: Use of drugs such as marijuana, cocaine, or opioids. Excessive use of alcohol. Smoking. Lack of physical activity or exercise. Psychological causes, such as: Anxiety or stress. Sadness or depression. Exhaustion. Fear about sexual performance. Guilt. What are the signs or symptoms? Symptoms of this condition include: Inability to get an erection. Lack of enough hardness of the erection to allow penetration. Loss of the erection before sex is finished. Sometimes having normal erections, but with frequent unsatisfactory episodes. Low sexual satisfaction in either partner due to erection problems. A curved penis occurring with erection. The curve may cause pain, or the penis may be too curved to allow for intercourse. Never having nighttime or morning erections. How is this diagnosed? This condition is often diagnosed by: Performing a physical exam to find other diseases or specific problems with the penis. Asking you detailed questions about the problem. Doing tests,  such as: Blood tests to check for diabetes mellitus or high cholesterol, or to measure hormone levels. Other tests to check for underlying health conditions. An ultrasound exam to check for scarring. A test to check blood flow to the penis. Doing a sleep study at home to measure nighttime erections. How is this treated? This condition may be treated by: Medicines, such as: Medicine taken by mouth to help you achieve an erection (oral medicine). Hormone replacement therapy to replace low testosterone levels. Medicine that is injected into the penis. Your health care provider may instruct you how to give yourself these injections at home. Medicine that is delivered with a short applicator tube. The tube is inserted into the opening at the tip of the penis, which is the opening of the urethra. A tiny pellet of medicine is put in the urethra. The pellet dissolves and enhances erectile function. This is also called MUSE (medicated urethral system for erections) therapy. Vacuum pump. This is a pump with a ring on it. The pump and ring are placed on the penis and used to create pressure that helps the penis become erect. Penile implant surgery. In this procedure, you may receive: An inflatable implant. This consists of cylinders, a pump, and a reservoir. The cylinders can be inflated with a fluid that helps to create an erection, and they can be deflated after intercourse. A semi-rigid implant. This consists of two silicone rubber rods. The rods provide some rigidity. They are also flexible, so the penis can both curve downward in its normal position and become straight for sexual intercourse. Blood vessel surgery to improve blood flow to the penis. During this procedure, a blood vessel from a different part of the body is placed into the penis to allow blood to flow around (bypass) damaged or blocked blood vessels. Lifestyle changes,  such as exercising more, losing weight, and quitting smoking. Follow  these instructions at home: Medicines  Take over-the-counter and prescription medicines only as told by your health care provider. Do not increase the dosage without first discussing it with your health care provider. If you are using self-injections, do injections as directed by your health care provider. Make sure you avoid any veins that are on the surface of the penis. After giving an injection, apply pressure to the injection site for 5 minutes. Talk to your health care provider about how to prevent headaches while taking ED medicines. These medicines may cause a sudden headache due to the increase in blood flow in your body. General instructions Exercise regularly, as directed by your health care provider. Work with your health care provider to lose weight, if needed. Do not use any products that contain nicotine or tobacco. These products include cigarettes, chewing tobacco, and vaping devices, such as e-cigarettes. If you need help quitting, ask your health care provider. Before using a vacuum pump, read the instructions that come with the pump and discuss any questions with your health care provider. Keep all follow-up visits. This is important. Contact a health care provider if: You feel nauseous. You are vomiting. You get sudden headaches while taking ED medicines. You have any concerns about your sexual health. Get help right away if: You are taking oral or injectable medicines and you have an erection that lasts longer than 4 hours. If your health care provider is unavailable, go to the nearest emergency room for evaluation. An erection that lasts much longer than 4 hours can result in permanent damage to your penis. You have severe pain in your groin or abdomen. You develop redness or severe swelling of your penis. You have redness spreading at your groin or lower abdomen. You are unable to urinate. You experience chest pain or a rapid heartbeat (palpitations) after taking oral  medicines. These symptoms may represent a serious problem that is an emergency. Do not wait to see if the symptoms will go away. Get medical help right away. Call your local emergency services (911 in the [place] Do not drive yourself to the hospital. Summary Erectile dysfunction (ED) is the inability to get or keep an erection during sexual intercourse. This condition is diagnosed based on a physical exam, your symptoms, and tests to determine the cause. Treatment varies depending on the cause and may include medicines, hormone therapy, surgery, or a vacuum pump. You may need follow-up visits to make sure that you are using your medicines or devices correctly. Get help right away if you are taking or injecting medicines and you have an erection that lasts longer than 4 hours. This information is not intended to replace advice given to you by your health care provider. Make sure you discuss any questions you have with your health care provider. Document Revised: 08/21/2020 Document Reviewed: 08/21/2020 Elsevier Patient Education  [place]. Influenza (Flu) Vaccine (Inactivated or Recombinant): What You Need to Know 1. Why get vaccinated? Influenza vaccine can prevent influenza (flu). Flu is a contagious disease that spreads around the [place] every year, usually between October and May. Anyone can get the flu, but it is more dangerous for some people. Infants and young children, people 3 years and older, pregnant people, and people with certain health conditions or a weakened immune system are at greatest risk of flu complications. Pneumonia, bronchitis, sinus infections, and ear infections are examples of flu-related complications. If you have  a medical condition, such as heart disease, cancer, or diabetes, flu can make it worse. Flu can cause fever and chills, sore throat, muscle aches, fatigue, cough, headache, and runny or stuffy nose. Some people may have vomiting and diarrhea,  though this is more common in children than adults. In an average year, thousands of people in the [place] die from flu, and many more are hospitalized. Flu vaccine prevents millions of illnesses and flu-related visits to the doctor each year. 2. Influenza vaccines CDC recommends everyone 6 months and older get vaccinated every flu season. Children 6 months through 54 years of age may need 2 doses during a single flu season. Everyone else needs only 1 dose each flu season. It takes about 2 weeks for protection to develop after vaccination. There are many flu viruses, and they are always changing. Each year a new flu vaccine is made to protect against the influenza viruses believed to be likely to cause disease in the upcoming flu season. Even when the vaccine doesn't exactly match these viruses, it may still provide some protection. Influenza vaccine does not cause flu. Influenza vaccine may be given at the same time as other vaccines. 3. Talk with your health care provider Tell your vaccination provider if the person getting the vaccine: Has had an allergic reaction after a previous dose of influenza vaccine, or has any severe, life-threatening allergies Has ever had Guillain-Barr Syndrome (also called "GBS") In some cases, your health care provider may decide to postpone influenza vaccination until a future visit. Influenza vaccine can be administered at any time during pregnancy. People who are or will be pregnant during influenza season should receive inactivated influenza vaccine. People with minor illnesses, such as a cold, may be vaccinated. People who are moderately or severely ill should usually wait until they recover before getting influenza vaccine. Your health care provider can give you more information. 4. Risks of a vaccine reaction Soreness, redness, and swelling where the shot is given, fever, muscle aches, and headache can happen after influenza vaccination. There may be a  very small increased risk of Guillain-Barr Syndrome (GBS) after inactivated influenza vaccine (the flu shot). Young children who get the flu shot along with pneumococcal vaccine (PCV13) and/or DTaP vaccine at the same time might be slightly more likely to have a seizure caused by fever. Tell your health care provider if a child who is getting flu vaccine has ever had a seizure. People sometimes faint after medical procedures, including vaccination. Tell your provider if you feel dizzy or have vision changes or ringing in the ears. As with any medicine, there is a very remote chance of a vaccine causing a severe allergic reaction, other serious injury, or death. 5. What if there is a serious problem? An allergic reaction could occur after the vaccinated person leaves the clinic. If you see signs of a severe allergic reaction (hives, swelling of the face and throat, difficulty breathing, a fast heartbeat, dizziness, or weakness), call 9-1-1 and get the person to the nearest hospital. For other signs that concern you, call your health care provider. Adverse reactions should be reported to the Vaccine Adverse Event Reporting System (VAERS). Your health care provider will usually file this report, or you can do it yourself. Visit the VAERS website at www.vaers.SamedayNews.es or call (228)054-8714. VAERS is only for reporting reactions, and VAERS staff members do not give medical advice. 6. The National Vaccine Injury Compensation Program The [employer] Vaccine Injury Compensation Program ([place]) is a  federal program that was created to compensate people who may have been injured by certain vaccines. Claims regarding alleged injury or death due to vaccination have a time limit for filing, which may be as short as two years. Visit the VICP website at SpiritualWord.at or call 646-252-7192 to learn about the program and about filing a claim. 7. How can I learn more? Ask your health care provider. Call  your local or state health department. Visit the website of the Food and Drug Administration (FDA) for vaccine package inserts and additional information at FinderList.no. Contact the Centers for Disease Control and Prevention (CDC): Call 936-294-5537 (1-800-CDC-INFO) or Visit CDC's website at BiotechRoom.com.cy. Source: CDC Vaccine Information Statement Inactivated Influenza Vaccine (01/12/2020) This same material is available at FootballExhibition.com.br for no charge. This information is not intended to replace advice given to you by your health care provider. Make sure you discuss any questions you have with your health care provider. Document Revised: 04/22/2021 Document Reviewed: 02/13/2021 Elsevier Patient Education  2023 [employer].

## 2022-05-26 NOTE — Progress Notes (Signed)
Headaches last 2 weeks. Constipation.

## 2022-05-26 NOTE — Progress Notes (Signed)
Subjective:  Patient ID: Jesse Ho, male    DOB: 1975-11-04  Age: 46 y.o. MRN: [id number]  CC: New Patient (Initial Visit)   HPI HARROL NOVELLO is a 46 y.o. year old male with a history of hypertension, hyperlipidemia, TIA Previously followed by a Clinician in [place] but can no longer go there to see distance.  Interval History: He is currently under the care of [facility] cardiology last visit in 02/2022.  He has no residual from his TIA and has no cardiac symptoms. Bp is elevated and he is yet to take his antihypertensives today as it was in a hurry.  Today he complains of constipation and headaches. Constipation is chronic and he moves his bowels every 3 days. Miralax and fiber supplements have been ineffective.  For the last 2 weeks he has had headaches on the right side of his face, frontal regions with some facial pressure worse in the mornings and night but no nasal congestion or post nasal drip. Ibuprofen provides mild relief He complains of problems with erection and would like treatment for this. Past Medical History:  Diagnosis Date   Angina pectoris (HCC) 09/13/2018   Arthritis    played foot ball   Cervical dystonia    CVA (cerebral vascular accident) (HCC)    mini stroke   Essential hypertension 09/13/2018   Mild hyperlipidemia 09/13/2018   Palpitations 09/13/2018    Past Surgical History:  Procedure Laterality Date   CYSTECTOMY Left 10/19/2014   left branchial cleft cyst removed   FRACTURE SURGERY     fx fingers on left hand playing football   and bil L wrist    INSERTION OF MESH N/A 03/19/2017   Procedure: INSERTION OF MESH;  Surgeon: Gaynelle Adu, MD;  Location: [facility];  Service: General;  Laterality: N/A;   TEE WITHOUT CARDIOVERSION N/A 10/27/2016   Procedure: TRANSESOPHAGEAL ECHOCARDIOGRAM (TEE);  Surgeon: Yates Decamp, MD;  Location: [facility] ENDOSCOPY;  Service: Cardiovascular;  Laterality: N/A;   UMBILICAL HERNIA REPAIR N/A 03/19/2017   Procedure: LAPAROSCOPIC  SUPRA UMBILICAL AND UMBILICAL HERNIA REPAIR WITH MESH;  Surgeon: Gaynelle Adu, MD;  Location: [facility];  Service: General;  Laterality: N/A;    Family History  Problem Relation Age of Onset   Hyperlipidemia Other    Hypertension Other    Diabetes Other    Obesity Other    Diabetes Sister     Social History   Socioeconomic History   Marital status: Married    Spouse name: Lowella Bandy   Number of children: 2   Years of education: 12+   Highest education level: Not on file  Occupational History    Comment: [employer], designs websites  Tobacco Use   Smoking status: Never   Smokeless tobacco: Never  Vaping Use   Vaping Use: Never used  Substance and Sexual Activity   Alcohol use: Yes    Comment: social   Drug use: No   Sexual activity: Yes  Other Topics Concern   Not on file  Social History Narrative   Lives at home w/ his wife and children   Right-handed   Caffeine: 3 cups of coffee/tea   Social Determinants of Health   Financial Resource Strain: Not on file  Food Insecurity: Not on file  Transportation Needs: Not on file  Physical Activity: Not on file  Stress: Not on file  Social Connections: Not on file    Allergies  Allergen Reactions   Keflex [Cephalexin] Anaphylaxis  Outpatient Medications Prior to Visit  Medication Sig Dispense Refill   aspirin EC 81 MG tablet Take 1 tablet (81 mg total) by mouth daily. Swallow whole. 90 tablet 3   methylPREDNISolone (MEDROL DOSEPAK) 4 MG TBPK tablet 6 day dose pack - take as directed 21 tablet 0   metoprolol tartrate (LOPRESSOR) 25 MG tablet TAKE 1 TABLET BY MOUTH TWICE A DAY 180 tablet 1   olmesartan-hydrochlorothiazide (BENICAR HCT) 40-25 MG tablet TAKE 1 TABLET BY MOUTH EVERY DAY 90 tablet 2   pravastatin (PRAVACHOL) 40 MG tablet TAKE 1 TABLET BY MOUTH EVERY DAY IN THE EVENING 90 tablet 0   colchicine 0.6 MG tablet TAKE 1 TABLET (0.6 MG TOTAL) BY MOUTH DAILY. TAKE FOR GOUT FLARE- TAKE FOR 3-5 DAYS (Patient not taking:  Reported on 05/26/2022) 90 tablet 1   No facility-administered medications prior to visit.     ROS Review of Systems  Constitutional:  Negative for activity change and appetite change.  HENT:  Negative for sinus pressure and sore throat.   Respiratory:  Negative for chest tightness, shortness of breath and wheezing.   Cardiovascular:  Negative for chest pain and palpitations.  Gastrointestinal:  Positive for constipation. Negative for abdominal distention and abdominal pain.  Genitourinary: Negative.   Musculoskeletal: Negative.   Neurological:  Positive for headaches.  Psychiatric/Behavioral:  Negative for behavioral problems and dysphoric mood.     Objective:  BP (!) 159/89   Pulse 69   Temp 98.1 F (36.7 C) (Oral)   Ht 5\' 6"  (1.676 m)   Wt 195 lb (88.5 kg)   SpO2 96%   BMI 31.47 kg/m      05/26/2022    8:54 AM 02/16/2022    1:36 PM 12/31/2021    8:59 AM  BP/Weight  Systolic BP 159 113 134  Diastolic BP 89 67 90  Wt. (Lbs) 195 194 187.8  BMI 31.47 kg/m2 31.31 kg/m2 30.31 kg/m2      Physical Exam Constitutional:      Appearance: He is well-developed.  HENT:     Right Ear: Tympanic membrane normal.     Left Ear: Tympanic membrane normal.     Mouth/Throat:     Mouth: Mucous membranes are moist.  Cardiovascular:     Rate and Rhythm: Normal rate.     Heart sounds: Normal heart sounds. No murmur heard. Pulmonary:     Effort: Pulmonary effort is normal.     Breath sounds: Normal breath sounds. No wheezing or rales.  Chest:     Chest wall: No tenderness.  Abdominal:     General: Bowel sounds are normal. There is no distension.     Palpations: Abdomen is soft. There is no mass.     Tenderness: There is no abdominal tenderness.  Musculoskeletal:        General: Normal range of motion.     Right lower leg: No edema.     Left lower leg: No edema.  Neurological:     Mental Status: He is alert and oriented to person, place, and time.  Psychiatric:        Mood  and Affect: Mood normal.        Latest Ref Rng & Units 02/12/2022    3:16 PM 01/02/2022   10:31 AM 08/26/2020   10:36 AM  CMP  Glucose 70 - 99 mg/dL 08/28/2020  191  478   BUN 6 - 24 mg/dL 13  13  12    Creatinine 0.76 - 1.27  mg/dL 9.76  7.34  1.93   Sodium 134 - 144 mmol/L 140  138  139   Potassium 3.5 - 5.2 mmol/L 4.3  4.5  4.3   Chloride 96 - 106 mmol/L 100  98  99   CO2 20 - 29 mmol/L 23  25  22    Calcium 8.7 - 10.2 mg/dL 9.8   9.5   Total Protein 6.0 - 8.5 g/dL  7.6    Total Bilirubin 0.0 - 1.2 mg/dL  1.2    Alkaline Phos 44 - 121 IU/L  69    AST 0 - 40 IU/L  20    ALT 0 - 44 IU/L  28      Lipid Panel     Component Value Date/Time   CHOL 169 01/02/2022 1031   TRIG 141 01/02/2022 1031   HDL 30 (L) 01/02/2022 1031   CHOLHDL 6.0 10/20/2016 0447   VLDL 27 10/20/2016 0447   LDLCALC 114 (H) 01/02/2022 1031   LDLDIRECT 104 (H) 01/02/2022 1031    CBC    Component Value Date/Time   WBC 6.5 02/12/2022 1516   WBC 7.0 07/29/2017 2032   RBC 4.21 02/12/2022 1516   RBC 4.54 07/29/2017 2032   HGB 13.1 02/12/2022 1516   HCT 39.2 02/12/2022 1516   PLT 282 02/12/2022 1516   MCV 93 02/12/2022 1516   MCH 31.1 02/12/2022 1516   MCH 31.1 07/29/2017 2032   MCHC 33.4 02/12/2022 1516   MCHC 34.0 07/29/2017 2032   RDW 13.4 02/12/2022 1516   LYMPHSABS 0.9 02/12/2022 1516   MONOABS 0.5 02/18/2017 1252   EOSABS 0.0 02/12/2022 1516   BASOSABS 0.0 02/12/2022 1516    Lab Results  Component Value Date   HGBA1C 4.6 (L) 10/20/2016    Assessment & Plan:  1. Sinus headache - cetirizine (ZYRTEC) 10 MG tablet; Take 1 tablet (10 mg total) by mouth daily.  Dispense: 30 tablet; Refill: 1 - fluticasone (FLONASE) 50 MCG/ACT nasal spray; Place 2 sprays into both nostrils daily.  Dispense: 16 g; Refill: 6  2. Other constipation Counseled on increasing fiber intake, fruits and vegetable, limit intake of foods like cheese, white bread, white rice Symptoms have not improved with fiber supplements  and MiraLAX Will place on Amitiza - lubiprostone (AMITIZA) 8 MCG capsule; Take 1 capsule (8 mcg total) by mouth 2 (two) times daily with a meal.  Dispense: 60 capsule; Refill: 5  3. Other male erectile dysfunction Counseled on pathophysiology of erectile dysfunction - Testosterone, Free, Total, SHBG - sildenafil (VIAGRA) 50 MG tablet; Take 1 tablet (50 mg total) by mouth daily as needed for erectile dysfunction.  Dispense: 10 tablet; Refill: 0  4. Essential hypertension Uncontrolled due to the fact that he is yet to take his antihypertensive Medication adherence has been strongly recommended Counseled on blood pressure goal of less than 130/80, low-sodium, DASH diet, medication compliance, 150 minutes of moderate intensity exercise per week. Discussed medication compliance, adverse effects.  5. Screening for diabetes mellitus - Hemoglobin A1c  6. Screening for viral disease - HCV Ab w Reflex to Quant PCR  7. TIA (transient ischemic attack) No residual deficits Risk factor modification Continue statin  8. Screening for colon cancer - Ambulatory referral to Gastroenterology   Health Care Maintenance: Last Colonoscopy was 2 years ago at [facility] and he was placed on a 2 year recall Meds ordered this encounter  Medications   lubiprostone (AMITIZA) 8 MCG capsule    Sig: Take  1 capsule (8 mcg total) by mouth 2 (two) times daily with a meal.    Dispense:  60 capsule    Refill:  5   cetirizine (ZYRTEC) 10 MG tablet    Sig: Take 1 tablet (10 mg total) by mouth daily.    Dispense:  30 tablet    Refill:  1   fluticasone (FLONASE) 50 MCG/ACT nasal spray    Sig: Place 2 sprays into both nostrils daily.    Dispense:  16 g    Refill:  6   sildenafil (VIAGRA) 50 MG tablet    Sig: Take 1 tablet (50 mg total) by mouth daily as needed for erectile dysfunction.    Dispense:  10 tablet    Refill:  0    Follow-up: Return in about 6 months (around 11/25/2022) for Chronic medical  conditions.       Hoy RegisterEnobong Kriston Mckinnie, MD, FAAFP. [facility] [place], [place] [phone]   05/26/2022, 9:22 AM

## 2022-05-29 ENCOUNTER — Encounter: Payer: Self-pay | Admitting: Family Medicine

## 2022-05-29 LAB — HCV INTERPRETATION

## 2022-05-29 LAB — HCV AB W REFLEX TO QUANT PCR: HCV Ab: NONREACTIVE

## 2022-05-29 LAB — TESTOSTERONE, FREE, TOTAL, SHBG
Sex Hormone Binding: 17.9 nmol/L (ref 16.5–55.9)
Testosterone, Free: 6.2 pg/mL — ABNORMAL LOW (ref 6.8–21.5)
Testosterone: 213 ng/dL — ABNORMAL LOW (ref 264–916)

## 2022-05-29 LAB — HEMOGLOBIN A1C
Est. average glucose Bld gHb Est-mCnc: 94 mg/dL
Hgb A1c MFr Bld: 4.9 % (ref 4.8–5.6)

## 2022-06-04 ENCOUNTER — Ambulatory Visit: Payer: BC Managed Care – PPO | Attending: Family Medicine | Admitting: Family Medicine

## 2022-06-04 ENCOUNTER — Encounter: Payer: Self-pay | Admitting: Family Medicine

## 2022-06-04 DIAGNOSIS — E349 Endocrine disorder, unspecified: Secondary | ICD-10-CM | POA: Insufficient documentation

## 2022-06-04 MED ORDER — TESTOSTERONE CYPIONATE 200 MG/ML IM SOLN: 200.0000 mg | INTRAMUSCULAR | 5 refills | Status: DC

## 2022-06-04 NOTE — Patient Instructions (Signed)
Testosterone Injection What is this medication? TESTOSTERONE (tes TOS ter one) is used to increase testosterone levels in your body. It belongs to a group of medications called androgen hormones. This medicine may be used for other purposes; ask your health care provider or pharmacist if you have questions. COMMON BRAND NAME(S): Andro-L.A., Aveed, Delatestryl, Depo-Testosterone, Virilon What should I tell my care team before I take this medication? They need to know if you have any of these conditions: Cancer Diabetes Heart disease Kidney disease Liver disease Lung disease Prostate disease An unusual or allergic reaction to testosterone, other medications, foods, dyes, or preservatives If a male partner is pregnant or trying to get pregnant Breast-feeding How should I use this medication? This medication is for injection into a muscle. It is usually given in a hospital or clinic. A special MedGuide will be given to you by the pharmacist with each prescription and refill. Be sure to read this information carefully each time. Contact your care team regarding the use of this medication in children. While this medication may be prescribed for children as young as 12 years of age for selected conditions, precautions do apply. Overdosage: If you think you have taken too much of this medicine contact a poison control center or emergency room at once. NOTE: This medicine is only for you. Do not share this medicine with others. What if I miss a dose? Try not to miss a dose. Your care team will tell you when your next injection is due. Notify the office if you are unable to keep an appointment. What may interact with this medication? Medications for diabetes Medications that treat or prevent blood clots like warfarin Oxyphenbutazone Propranolol Steroid medications like prednisone or cortisone This list may not describe all possible interactions. Give your health care provider a list of all the  medicines, herbs, non-prescription drugs, or dietary supplements you use. Also tell them if you smoke, drink alcohol, or use illegal drugs. Some items may interact with your medicine. What should I watch for while using this medication? Visit your care team for regular checks on your progress. They will need to check the level of testosterone in your blood. This medication is only approved for use in men who have low levels of testosterone related to certain medical conditions. Heart attacks and strokes have been reported with the use of this medication. Notify your care team and seek emergency treatment if you develop breathing problems; changes in vision; confusion; chest pain or chest tightness; sudden arm pain; severe, sudden headache; trouble speaking or understanding; sudden numbness or weakness of the face, arm or leg; loss of balance or coordination. Talk to your care team about the risks and benefits of this medication. This medication may affect blood sugar levels. If you have diabetes, check with your care team before you change your diet or the dose of your diabetic medication. Testosterone injections are not commonly used in women. Women should inform their care team if they wish to become pregnant or think they might be pregnant. There is a potential for serious side effects to an unborn child. Talk to your care team or pharmacist for more information. Talk with your care team about your birth control options while taking this medication. This medication is banned from use in athletes by most athletic organizations. What side effects may I notice from receiving this medication? Side effects that you should report to your care team as soon as possible: Allergic reactions--skin rash, itching, hives, swelling of the   face, lips, tongue, or throat Blood clot--pain, swelling, or warmth in the leg, shortness of breath, chest pain Heart attack--pain or tightness in the chest, shoulders, arms or jaw,  nausea, shortness of breath, cold or clammy skin, feeling faint or lightheaded Increase in blood pressure Liver injury--right upper belly pain, loss of appetite, nausea, light-colored stool, dark yellow or brown urine, yellowing of the skin or eyes, unusual weakness or fatigue Mood swings, irritability, or hostility Prolonged or painful erection Sleep apnea--loud snoring, gasping during sleep, daytime sleepiness Stroke--sudden numbness or weakness of the face, arm or leg, trouble speaking, confusion, trouble walking, loss of balance or coordination, dizziness, severe headache, change in vision Swelling of the ankles, hands, or feet Thoughts of suicide or self-harm, worsening mood, feelings of depression Side effects that usually do not require medical attention (report to your care team if they continue or are bothersome): Acne Change in sex drive or performance Pain, redness, or irritation at the application site Unexpected breast tissue growth This list may not describe all possible side effects. Call your doctor for medical advice about side effects. You may report side effects to FDA at 1-800-FDA-1088. Where should I keep my medication? Keep out of the reach of children. This medication can be abused. Keep your medication in a safe place to protect it from theft. Do not share this medication with anyone. Selling or giving away this medication is dangerous and against the law. Store at room temperature between 20 and 25 degrees C (68 and 77 degrees F). Do not freeze. Protect from light. Follow the directions for the product you are prescribed. Throw away any unused medication after the expiration date. NOTE: This sheet is a summary. It may not cover all possible information. If you have questions about this medicine, talk to your doctor, pharmacist, or health care provider.  2023 Elsevier/Gold Standard (2020-05-21 00:00:00)  

## 2022-06-04 NOTE — Progress Notes (Signed)
Virtual Visit via Telephone Note  I connected with Jesse Ho, on 06/04/2022 at 2:01 PM by telephone and verified that I am speaking with the correct person using two identifiers.   Consent: I discussed the limitations, risks, security and privacy concerns of performing an evaluation and management service by telephone and the availability of in person appointments. I also discussed with the patient that there may be a patient responsible charge related to this service. The patient expressed understanding and agreed to proceed.   Location of Patient: Home  Location of Provider: Clinic   Persons participating in Telemedicine visit: Jesse Ho Dr. Alvis Lemmings     History of Present Illness: Jesse Ho is a 46 y.o. year old male with history of hypertension, hyperlipidemia, previous TIA seen for an acute visit. At his last visit he had complained of erectile dysfunction, Viagra was initiated and labs had revealed testosterone deficiency.  He is interested in discussing testosterone replacement therapy.   Past Medical History:  Diagnosis Date   Angina pectoris (HCC) 09/13/2018   Arthritis    played foot ball   Cervical dystonia    CVA (cerebral vascular accident) (HCC)    mini stroke   Essential hypertension 09/13/2018   Mild hyperlipidemia 09/13/2018   Palpitations 09/13/2018   Allergies  Allergen Reactions   Keflex [Cephalexin] Anaphylaxis    Current Outpatient Medications on File Prior to Visit  Medication Sig Dispense Refill   aspirin EC 81 MG tablet Take 1 tablet (81 mg total) by mouth daily. Swallow whole. 90 tablet 3   cetirizine (ZYRTEC) 10 MG tablet Take 1 tablet (10 mg total) by mouth daily. 30 tablet 1   colchicine 0.6 MG tablet TAKE 1 TABLET (0.6 MG TOTAL) BY MOUTH DAILY. TAKE FOR GOUT FLARE- TAKE FOR 3-5 DAYS (Patient not taking: Reported on 05/26/2022) 90 tablet 1   fluticasone (FLONASE) 50 MCG/ACT nasal spray Place 2 sprays into both nostrils daily. 16  g 6   lubiprostone (AMITIZA) 8 MCG capsule Take 1 capsule (8 mcg total) by mouth 2 (two) times daily with a meal. 60 capsule 5   methylPREDNISolone (MEDROL DOSEPAK) 4 MG TBPK tablet 6 day dose pack - take as directed 21 tablet 0   metoprolol tartrate (LOPRESSOR) 25 MG tablet TAKE 1 TABLET BY MOUTH TWICE A DAY 180 tablet 1   olmesartan-hydrochlorothiazide (BENICAR HCT) 40-25 MG tablet TAKE 1 TABLET BY MOUTH EVERY DAY 90 tablet 2   pravastatin (PRAVACHOL) 40 MG tablet TAKE 1 TABLET BY MOUTH EVERY DAY IN THE EVENING 90 tablet 0   sildenafil (VIAGRA) 50 MG tablet Take 1 tablet (50 mg total) by mouth daily as needed for erectile dysfunction. 10 tablet 0   No current facility-administered medications on file prior to visit.    ROS: See HPI  Observations/Objective: Awake, alert, oriented x3 Not in acute distress Normal mood      Latest Ref Rng & Units 02/12/2022    3:16 PM 01/02/2022   10:31 AM 08/26/2020   10:36 AM  CMP  Glucose 70 - 99 mg/dL 790  240  973   BUN 6 - 24 mg/dL 13  13  12    Creatinine 0.76 - 1.27 mg/dL  5.32  9.92   Sodium 134 - 144 mmol/L 140  138  139   Potassium 3.5 - 5.2 mmol/L 4.3  4.5  4.3   Chloride 96 - 106 mmol/L 100  98  99   CO2 20 - 29  mmol/L 23  25  22    Calcium 8.7 - 10.2 mg/dL 9.8   9.5   Total Protein 6.0 - 8.5 g/dL  7.6    Total Bilirubin 0.0 - 1.2 mg/dL  1.2    Alkaline Phos 44 - 121 IU/L  69    AST 0 - 40 IU/L  20    ALT 0 - 44 IU/L  28      Lipid Panel     Component Value Date/Time   CHOL 169 01/02/2022 1031   TRIG 141 01/02/2022 1031   HDL 30 (L) 01/02/2022 1031   CHOLHDL 6.0 10/20/2016 0447   VLDL 27 10/20/2016 0447   LDLCALC 114 (H) 01/02/2022 1031   LDLDIRECT 104 (H) 01/02/2022 1031   LABVLDL 25 01/02/2022 1031    Lab Results  Component Value Date   HGBA1C 4.9 05/26/2022    Assessment and Plan: 1. Testosterone deficiency Discussed risk and benefits of testosterone replacement therapy including but not limited to  polycythemia, increased PSA, increased risk for thrombosis especially in the light of his previous TIA He would like to proceed with replacement Once he obtains his medication he will call to schedule a nurse visit for injections Will check PSA, CBC, testosterone level at next visit. - testosterone cypionate (DEPOTESTOSTERONE CYPIONATE) 200 MG/ML injection; Inject 1 mL (200 mg total) into the muscle every 28 (twenty-eight) days.  Dispense: 10 mL; Refill: 5   Follow Up Instructions: Previously scheduled appointment   I discussed the assessment and treatment plan with the patient. The patient was provided an opportunity to ask questions and all were answered. The patient agreed with the plan and demonstrated an understanding of the instructions.   The patient was advised to call back or seek an in-person evaluation if the symptoms worsen or if the condition fails to improve as anticipated.     I provided 11 minutes total of non-face-to-face time during this encounter.   05/28/2022, MD, FAAFP. [facility] [place], [place]   06/04/2022, 2:01 PM

## 2022-06-10 ENCOUNTER — Telehealth: Payer: Self-pay | Admitting: Family Medicine

## 2022-06-10 ENCOUNTER — Telehealth: Payer: Self-pay

## 2022-06-10 NOTE — Telephone Encounter (Signed)
Copied from CRM [id number]. Topic: Appointment Scheduling - Scheduling Inquiry for Clinic >> Jun 10, 2022  2:26 PM Dominique E wrote: Reason for CRM: Pt called reporting that he has testosterone shots that he is ready to have scheduled. Please advise. Prefers early morning slots any day   Best contact: [phone] 

## 2022-06-10 NOTE — Telephone Encounter (Signed)
Copied from [place] [id number]. Topic: Appointment Scheduling - Scheduling Inquiry for Clinic >> Jun 10, 2022  2:26 PM Jesse Ho E wrote: Reason for CRM: Pt called reporting that he has testosterone shots that he is ready to have scheduled. Please advise. Prefers early morning slots any day   Best contact: [phone]

## 2022-06-11 NOTE — Telephone Encounter (Signed)
Please reach out to patient and schedule him for a nurse visit.

## 2022-06-15 ENCOUNTER — Ambulatory Visit: Payer: BC Managed Care – PPO | Attending: Family Medicine | Admitting: *Deleted

## 2022-06-15 ENCOUNTER — Other Ambulatory Visit: Payer: Self-pay | Admitting: Family Medicine

## 2022-06-15 DIAGNOSIS — E349 Endocrine disorder, unspecified: Secondary | ICD-10-CM | POA: Diagnosis not present

## 2022-06-15 MED ORDER — TESTOSTERONE CYPIONATE 100 MG/ML IM SOLN
100.0000 mg | INTRAMUSCULAR | Status: DC
  Administered 2022-06-15 – 2022-07-22 (×2): 100 mg via INTRAMUSCULAR

## 2022-06-15 MED ORDER — DOXYCYCLINE HYCLATE 100 MG PO TABS: 100.0000 mg | ORAL_TABLET | Freq: Two times a day (BID) | ORAL | 0 refills | Status: DC

## 2022-06-15 NOTE — Progress Notes (Signed)
Patient here today for Testosterone injection.  Testosterone injection given today left deltoid.  Site unremarkable & patient tolerated injection.  Next injection due July 13, 2022. Carilyn Goodpasture, RN,BSN

## 2022-06-17 ENCOUNTER — Other Ambulatory Visit: Payer: Self-pay | Admitting: Family Medicine

## 2022-06-17 DIAGNOSIS — R519 Headache, unspecified: Secondary | ICD-10-CM

## 2022-06-17 NOTE — Telephone Encounter (Signed)
Unable to refill per protocol, Rx request is too soon. Last refill 05/26/22 for 30 and 1 refill.  Requested Prescriptions  Pending Prescriptions Disp Refills   cetirizine (ZYRTEC) 10 MG tablet [Pharmacy Med Name: CETIRIZINE HCL 10 MG TABLET] 90 tablet 1    Sig: TAKE 1 TABLET BY MOUTH EVERY DAY     Ear, Nose, and Throat:  Antihistamines 2 Passed - 06/17/2022  2:31 PM      Passed - Cr in normal range and within 360 days    Creatinine, Ser  Date Value Ref Range Status  02/12/2022 1.11 0.76 - 1.27 mg/dL Final         Passed - Valid encounter within last 12 months    Recent Outpatient Visits           1 week ago Testosterone deficiency   [place], Charlane Ferretti, MD   3 weeks ago Sinus headache   [place], Enobong, MD       Future Appointments             In 5 months Charlott Rakes, MD [place]   In 8 months [place], [place], DO [facility]

## 2022-06-25 ENCOUNTER — Ambulatory Visit: Payer: BC Managed Care – PPO | Admitting: Cardiology

## 2022-06-25 ENCOUNTER — Encounter: Payer: Self-pay | Admitting: Cardiology

## 2022-06-25 VITALS — BP 125/83 | HR 57 | Resp 18 | Ht 66.0 in | Wt 196.0 lb

## 2022-06-25 DIAGNOSIS — Z8673 Personal history of transient ischemic attack (TIA), and cerebral infarction without residual deficits: Secondary | ICD-10-CM | POA: Diagnosis not present

## 2022-06-25 DIAGNOSIS — I10 Essential (primary) hypertension: Secondary | ICD-10-CM | POA: Diagnosis not present

## 2022-06-25 DIAGNOSIS — K219 Gastro-esophageal reflux disease without esophagitis: Secondary | ICD-10-CM

## 2022-06-25 DIAGNOSIS — E785 Hyperlipidemia, unspecified: Secondary | ICD-10-CM | POA: Diagnosis not present

## 2022-06-25 DIAGNOSIS — R072 Precordial pain: Secondary | ICD-10-CM

## 2022-06-25 MED ORDER — PANTOPRAZOLE SODIUM 40 MG PO TBEC: 40.0000 mg | DELAYED_RELEASE_TABLET | Freq: Every day | ORAL | 11 refills | Status: AC

## 2022-06-25 MED ORDER — PRAVASTATIN SODIUM 80 MG PO TABS: 80.0000 mg | ORAL_TABLET | Freq: Every evening | ORAL | 0 refills | Status: DC

## 2022-06-25 MED ORDER — NITROGLYCERIN 0.4 MG SL SUBL: 0.4000 mg | SUBLINGUAL_TABLET | SUBLINGUAL | 0 refills | Status: AC | PRN

## 2022-06-25 NOTE — Progress Notes (Signed)
ID:  Jesse Ho, DOB Jun 03, 1976, MRN [id number]  PCP:  Charlott Rakes, MD  Cardiologist:  Rex Kras, DO, [facility] (established care 12/31/2021) Former Cardiology Providers: Dr. Adrian Prows, Lawerance Cruel, [place]  Date: 06/25/22 Last Office Visit: 02/16/2022  Chief Complaint  Patient presents with   Chest Pain   Arm Pain    HPI  Jesse Ho is a 47 y.o. African-American male whose  past medical history and cardiovascular risk factors include: Hypertension, TIA, HLD.   Patient was referred to the practice in the past due to elevated blood pressures.  With uptitration of medical therapy his blood pressures did improve.  However during prior visits he is also complained of symptoms of chest pain.  He was recommended to undergo coronary CTA which was cost prohibitive and his out-of-pocket expense was approximately $700.  He presents today for sick visit for chest pain evaluation.  It is left-sided, ongoing for the last 1 week, continuous without interruptions, exacerbated by exertion, better with resting but not completely resolved, had an episode of pretty bad heartburn 2 days ago (could not sleep but felt that the intensity/frequency/duration was not any worse compared to his baseline at times).   Denies heart failure symptoms, near-syncope/syncope.  Does not check his blood pressures at home regularly; however, yesterday it was 148/87.  Started testosterone treatments 2 weeks ago and prior to that was using ED medications.  ALLERGIES: Allergies  Allergen Reactions   Keflex [Cephalexin] Anaphylaxis    MEDICATION LIST PRIOR TO VISIT: Current Meds  Medication Sig   aspirin EC 81 MG tablet Take 1 tablet (81 mg total) by mouth daily. Swallow whole.   cetirizine (ZYRTEC) 10 MG tablet Take 1 tablet (10 mg total) by mouth daily.   metoprolol tartrate (LOPRESSOR) 25 MG tablet TAKE 1 TABLET BY MOUTH TWICE A DAY   olmesartan-hydrochlorothiazide (BENICAR HCT) 40-25 MG tablet TAKE 1  TABLET BY MOUTH EVERY DAY   testosterone cypionate (DEPOTESTOSTERONE CYPIONATE) 200 MG/ML injection Inject 1 mL (200 mg total) into the muscle every 28 (twenty-eight) days.   Current Facility-Administered Medications for the 06/25/22 encounter (Office Visit) with Rex Kras, DO  Medication   testosterone cypionate (DEPOTESTOTERONE CYPIONATE) injection 100 mg     PAST MEDICAL HISTORY: Past Medical History:  Diagnosis Date   Angina pectoris ([place]) 09/13/2018   Arthritis    played foot ball   Cervical dystonia    CVA (cerebral vascular accident) ([place])    mini stroke   Essential hypertension 09/13/2018   Mild hyperlipidemia 09/13/2018   Palpitations 09/13/2018    PAST SURGICAL HISTORY: Past Surgical History:  Procedure Laterality Date   CYSTECTOMY Left 10/19/2014   left branchial cleft cyst removed   FRACTURE SURGERY     fx fingers on left hand playing football   and bil L wrist    INSERTION OF MESH N/A 03/19/2017   Procedure: INSERTION OF MESH;  Surgeon: Greer Pickerel, MD;  Location: [facility];  Service: General;  Laterality: N/A;   TEE WITHOUT CARDIOVERSION N/A 10/27/2016   Procedure: TRANSESOPHAGEAL ECHOCARDIOGRAM (TEE);  Surgeon: Adrian Prows, MD;  Location: [place];  Service: Cardiovascular;  Laterality: N/A;   UMBILICAL HERNIA REPAIR N/A 03/19/2017   Procedure: LAPAROSCOPIC SUPRA UMBILICAL AND UMBILICAL HERNIA REPAIR WITH MESH;  Surgeon: Greer Pickerel, MD;  Location: [facility];  Service: General;  Laterality: N/A;    FAMILY HISTORY: The patient family history includes Diabetes in his sister and another family member; Hyperlipidemia in an other family  member; Hypertension in an other family member; Obesity in an other family member.  SOCIAL HISTORY:  The patient  reports that he has never smoked. He has never used smokeless tobacco. He reports current alcohol use. He reports that he does not use drugs.  REVIEW OF SYSTEMS: Review of Systems  Cardiovascular:  Negative for chest pain,  claudication, dyspnea on exertion, irregular heartbeat, leg swelling, near-syncope, orthopnea, palpitations, paroxysmal nocturnal dyspnea and syncope.  Respiratory:  Negative for shortness of breath.   Hematologic/Lymphatic: Negative for bleeding problem.  Musculoskeletal:  Negative for muscle cramps and myalgias.  Neurological:  Negative for dizziness and light-headedness.    PHYSICAL EXAM:    06/25/2022    9:39 AM 05/26/2022    8:54 AM 02/16/2022    1:36 PM  Vitals with BMI  Height 5\' 6"  5\' 6"  5\' 6"   Weight 196 lbs 195 lbs 194 lbs  BMI 31.65 31.49 31.33  Systolic 125 159  Diastolic 83 89 67  Pulse 57 69 71    Physical Exam  Constitutional: He appears healthy.  Neck: No JVD present.  Cardiovascular: Normal rate, regular rhythm, S1 normal, S2 normal, intact distal pulses and normal pulses. Exam reveals no gallop, no S3 and no S4.  No murmur heard. Pulmonary/Chest: Effort normal and breath sounds normal. No stridor. He has no wheezes. He has no rales.  Abdominal: Soft. Bowel sounds are normal. He exhibits no distension. There is no abdominal tenderness.  Musculoskeletal:        General: No edema. Normal range of motion.     Cervical back: Neck supple.  Neurological: He is alert and oriented to person, place, and time.  Skin: Skin is warm and moist.   CARDIAC DATABASE: EKG: 06/25/2022: Bradycardia, 47 bpm, LVH with voltage criteria without ST-T abnormality, TWI in the inferior leads cannot rule out ischemia.  Findings are similar to prior EKG dated 12/31/2021.  Echocardiogram: 02/06/2022:  Normal LV systolic function with visual EF 60-65%. Left ventricle cavity is normal in size. Normal global wall motion. Mild to moderate left ventricular hypertrophy. Normal diastolic filling pattern, normal LAP.  Mild pulmonic regurgitation.  No significant change compared to 08/2020.   Stress Testing: Nuclear stress test  [12/28/2016]:  1. The resting electrocardiogram demonstrated  normal sinus rhythm, normal resting conduction, no resting arrhythmias and normal rest repolarization. The stress electrocardiogram was normal. Patient exercised on Bruce protocol for 10:00 minutes and achieved 11.75 METS. Stress test terminated due to dyspnea, chest pain(6/10) and 85% MPHR achieved (Target HR >85%). Hypertensive both in rest and stress with peak BP of 190/92 mm Hg. 2. Left ventricular cavity is noted to be normal on the rest and stress studies. SPECT images demonstrate homogeneous tracer distribution throughout the myocardium. Gated SPECT imaging demonstrates global hypokinesis The left ventricular ejection fraction was calculated to be 40%. There appeared to be inferior and latral hypokinesis. This in an intermediate risk study in view of excellent effort. Clinical correlation recommended.   Heart Catheterization: None  CT Cardiac Scoring: 01/02/2022 Left Main: 1   LAD: 0   LCx: 0   RCA: 0   Total Agatston Score: 1   MESA database percentile: 78th   AORTA MEASUREMENTS:   Ascending Aorta: 3.2 cm   Descending Aorta: 2.7 cm  LABORATORY DATA:    Latest Ref Rng & Units 02/12/2022    3:16 PM 08/26/2020   10:36 AM 07/29/2017    8:32 PM  CBC  WBC 3.4 - 10.8 x10E3/uL 6.5  5.7  7.0   Hemoglobin 13.0 - 17.7 g/dL 13.1  14.9  14.1   Hematocrit 37.5 - 51.0 % 39.2  46.1  41.5   Platelets 150 - 450 x10E3/uL 282  288  209        Latest Ref Rng & Units 02/12/2022    3:16 PM 01/02/2022   10:31 AM 08/26/2020   10:36 AM  CMP  Glucose 70 - 99 mg/dL 132  137  171   BUN 6 - 24 mg/dL 13  13  12    Creatinine 0.76 - 1.27 mg/dL 1.11  1.27  1.13   Sodium 134 - 144 mmol/L 140  138  139   Potassium 3.5 - 5.2 mmol/L 4.3  4.5  4.3   Chloride 96 - 106 mmol/L 100  98  99   CO2 20 - 29 mmol/L 23  25  22    Calcium 8.7 - 10.2 mg/dL 9.8  10.1  9.5   Total Protein 6.0 - 8.5 g/dL  7.6    Total Bilirubin 0.0 - 1.2 mg/dL  1.2    Alkaline Phos 44 - 121 IU/L  69    AST 0 - 40 IU/L  20    ALT 0  - 44 IU/L  28      Lipid Panel  Lab Results  Component Value Date   CHOL 169 01/02/2022   HDL 30 (L) 01/02/2022   LDLCALC 114 (H) 01/02/2022   LDLDIRECT 104 (H) 01/02/2022   TRIG 141 01/02/2022   CHOLHDL 6.0 10/20/2016   No components found for: "NTPROBNP" No results for input(s): "PROBNP" in the last 8760 hours. No results for input(s): "TSH" in the last 8760 hours.  BMP Recent Labs    01/02/22 1031 02/12/22 1516  NA 138 140  K 4.5 4.3  CL 98 100  CO2 25 23  GLUCOSE 137* 132*  BUN 13 13  CREATININE 1.27 1.11  CALCIUM 10.1 9.8    HEMOGLOBIN A1C Lab Results  Component Value Date   HGBA1C 4.9 05/26/2022   MPG 85 10/20/2016    IMPRESSION:    ICD-10-CM   1. Precordial pain  R07.2 EKG 12-Lead    2. Mild hyperlipidemia  E78.5     3. Essential hypertension  I10     4. History of TIA (transient ischemic attack)  Z86.73     5. Gastroesophageal reflux disease, unspecified whether esophagitis present  K21.9        RECOMMENDATIONS: Jesse Ho is a 47 y.o. African-American male whose past medical history and cardiac risk factors include: Hypertension and TIA.  Precordial pain Mixed features of both cardiac and noncardiac discomfort EKG: Sinus rhythm with TWI in inferior leads, similar to prior readings. Repeat echocardiogram due to change in clinical status -to reevaluate LVEF and regional wall motion abnormalities. Coronary CTA to evaluate for obstructive CAD Continue aspirin 81 mg p.o. daily Patient has had issues with statins in the past and therefore we will start with pravastatin 80 mg p.o. nightly for now.  Ideally would need fasting lipids 6 weeks later to reevaluate therapy and LFTs.  Will order it at the next visit. Sublingual nitroglycerin tablets to use on a as needed basis.  Patient is aware of the drug to drug interactions between sublingual nitro and erectile dysfunction medications. Educated on seeking medical attention sooner by going to the  closest ER via EMS if the symptoms increase in intensity, frequency, duration, or has typical chest pain as discussed in the  office.  Patient verbalized understanding.  Mild hyperlipidemia Will start him on pravastatin. Given his severe TIA would recommend a goal LDL of less than 70 mg/dL Will need fasting lipid profile rechecked in 6 weeks after initiating therapy.  Essential hypertension Office blood pressures are well-controlled. I have asked him to keep a track of his blood pressures at home. Continue medical therapy. Once the coronary CTA is done we will transition him from Lopressor 25 twice daily to Toprol-XL 25 daily  History of TIA (transient ischemic attack) Education importance of secondary prevention. Continue aspirin. Start statin as discussed above  Gastroesophageal reflux disease, unspecified whether esophagitis present Component of his symptoms may be secondary to untreated heartburn. Will give him a trial of Protonix 40 mg p.o. daily for now. I have asked him to follow-up with PCP regarding further management.   FINAL MEDICATION LIST END OF ENCOUNTER: No orders of the defined types were placed in this encounter.   Medications Discontinued During This Encounter  Medication Reason   doxycycline (VIBRA-TABS) 100 MG tablet Completed Course   fluticasone (FLONASE) 50 MCG/ACT nasal spray    lubiprostone (AMITIZA) 8 MCG capsule    pravastatin (PRAVACHOL) 40 MG tablet    sildenafil (VIAGRA) 50 MG tablet      Current Outpatient Medications:    aspirin EC 81 MG tablet, Take 1 tablet (81 mg total) by mouth daily. Swallow whole., Disp: 90 tablet, Rfl: 3   cetirizine (ZYRTEC) 10 MG tablet, Take 1 tablet (10 mg total) by mouth daily., Disp: 30 tablet, Rfl: 1   metoprolol tartrate (LOPRESSOR) 25 MG tablet, TAKE 1 TABLET BY MOUTH TWICE A DAY, Disp: 180 tablet, Rfl: 1   olmesartan-hydrochlorothiazide (BENICAR HCT) 40-25 MG tablet, TAKE 1 TABLET BY MOUTH EVERY DAY, Disp: 90  tablet, Rfl: 2   testosterone cypionate (DEPOTESTOSTERONE CYPIONATE) 200 MG/ML injection, Inject 1 mL (200 mg total) into the muscle every 28 (twenty-eight) days., Disp: 10 mL, Rfl: 5   colchicine 0.6 MG tablet, TAKE 1 TABLET (0.6 MG TOTAL) BY MOUTH DAILY. TAKE FOR GOUT FLARE- TAKE FOR 3-5 DAYS (Patient not taking: Reported on 05/26/2022), Disp: 90 tablet, Rfl: 1   methylPREDNISolone (MEDROL DOSEPAK) 4 MG TBPK tablet, 6 day dose pack - take as directed, Disp: 21 tablet, Rfl: 0  Current Facility-Administered Medications:    testosterone cypionate (DEPOTESTOTERONE CYPIONATE) injection 100 mg, 100 mg, Intramuscular, Q28 days, Newlin, Enobong, MD, 100 mg at 06/15/22 0347  Orders Placed This Encounter  Procedures   EKG 12-Lead    There are no Patient Instructions on file for this visit.   --Continue cardiac medications as reconciled in final medication list. --No follow-ups on file. or sooner if needed. --Continue follow-up with your primary care physician regarding the management of your other chronic comorbid conditions.  Patient's questions and concerns were addressed to his satisfaction. He voices understanding of the instructions provided during this encounter.   This note was created using a voice recognition software as a result there may be grammatical errors inadvertently enclosed that do not reflect the nature of this encounter. Every attempt is made to correct such errors.  Tessa Lerner, [place], [facility]  Pager: [phone] Office: [phone]

## 2022-06-30 ENCOUNTER — Other Ambulatory Visit: Payer: BC Managed Care – PPO

## 2022-07-08 ENCOUNTER — Ambulatory Visit: Payer: BC Managed Care – PPO

## 2022-07-08 DIAGNOSIS — R072 Precordial pain: Secondary | ICD-10-CM

## 2022-07-11 LAB — CMP14+EGFR
ALT: 28 IU/L (ref 0–44)
AST: 23 IU/L (ref 0–40)
Albumin/Globulin Ratio: 1.9 (ref 1.2–2.2)
Albumin: 5 g/dL (ref 4.1–5.1)
Alkaline Phosphatase: 71 IU/L (ref 44–121)
BUN/Creatinine Ratio: 13 (ref 9–20)
BUN: 15 mg/dL (ref 6–24)
Bilirubin Total: 1 mg/dL (ref 0.0–1.2)
CO2: 25 mmol/L (ref 20–29)
Calcium: 9.5 mg/dL (ref 8.7–10.2)
Chloride: 101 mmol/L (ref 96–106)
Creatinine, Ser: 1.18 mg/dL (ref 0.76–1.27)
Globulin, Total: 2.6 g/dL (ref 1.5–4.5)
Glucose: 110 mg/dL — ABNORMAL HIGH (ref 70–99)
Potassium: 4.1 mmol/L (ref 3.5–5.2)
Sodium: 140 mmol/L (ref 134–144)
Total Protein: 7.6 g/dL (ref 6.0–8.5)
eGFR: 77 mL/min/{1.73_m2} (ref >59)

## 2022-07-11 LAB — LIPID PANEL WITH LDL/HDL RATIO
Cholesterol, Total: 140 mg/dL (ref 100–199)
HDL: 29 mg/dL — ABNORMAL LOW (ref >39)
LDL Chol Calc (NIH): 88 mg/dL (ref 0–99)
LDL/HDL Ratio: 3 ratio (ref 0.0–3.6)
Triglycerides: 129 mg/dL (ref 0–149)
VLDL Cholesterol Cal: 23 mg/dL (ref 5–40)

## 2022-07-11 LAB — LDL CHOLESTEROL, DIRECT: LDL Direct: 92 mg/dL (ref 0–99)

## 2022-07-17 ENCOUNTER — Telehealth (HOSPITAL_COMMUNITY): Payer: Self-pay | Admitting: Emergency Medicine

## 2022-07-17 ENCOUNTER — Other Ambulatory Visit: Payer: Self-pay | Admitting: Family Medicine

## 2022-07-17 DIAGNOSIS — R519 Headache, unspecified: Secondary | ICD-10-CM

## 2022-07-17 NOTE — Telephone Encounter (Signed)
Requested medication (s) are due for refill today - yes  Requested medication (s) are on the active medication list -yes  Future visit scheduled -yes  Last refill: 05/26/22 #30 1RF  Notes to clinic: Rx written for acute symptoms- want to continue Rx daily?  Requested Prescriptions  Pending Prescriptions Disp Refills   cetirizine (ZYRTEC) 10 MG tablet [Pharmacy Med Name: CETIRIZINE HCL 10 MG TABLET] 90 tablet 1    Sig: TAKE 1 TABLET BY MOUTH EVERY DAY     Ear, Nose, and Throat:  Antihistamines 2 Passed - 07/17/2022  2:33 PM      Passed - Cr in normal range and within 360 days    Creatinine, Ser  Date Value Ref Range Status  07/10/2022 1.18 0.76 - 1.27 mg/dL Final         Passed - Valid encounter within last 12 months    Recent Outpatient Visits           1 month ago Testosterone deficiency   [place], MD   1 month ago Sinus headache   [place], MD       Future Appointments             In 1 week [place], [place], [facility]   In 4 months Charlott Rakes, MD [place]   In 7 months [place], [place], [facility]               Requested Prescriptions  Pending Prescriptions Disp Refills   cetirizine (ZYRTEC) 10 MG tablet [Pharmacy Med Name: CETIRIZINE HCL 10 MG TABLET] 90 tablet 1    Sig: TAKE 1 TABLET BY MOUTH EVERY DAY     Ear, Nose, and Throat:  Antihistamines 2 Passed - 07/17/2022  2:33 PM      Passed - Cr in normal range and within 360 days    Creatinine, Ser  Date Value Ref Range Status  07/10/2022 1.18 0.76 - 1.27 mg/dL Final         Passed - Valid encounter within last 12 months    Recent Outpatient Visits           1 month ago Testosterone deficiency   [place], MD   1 month ago Sinus headache   [place], MD       Future Appointments             In 1 week [place], [place], [facility]   In 4 months Charlott Rakes, MD [place]   In 7 months [place], [place], [place] [facility]

## 2022-07-17 NOTE — Telephone Encounter (Signed)
Attempted to call patient regarding upcoming cardiac CT appointment. °Left message on voicemail with name and callback number °Lorretta Kerce RN Navigator Cardiac Imaging °[facility] °[phone] Office °[phone] Cell ° °

## 2022-07-20 ENCOUNTER — Ambulatory Visit (HOSPITAL_COMMUNITY)
Admission: RE | Admit: 2022-07-20 | Discharge: 2022-07-20 | Disposition: A | Discharge status: Home / Self Care | Payer: BC Managed Care – PPO | Source: Ambulatory Visit | Attending: Cardiology | Admitting: Cardiology

## 2022-07-20 ENCOUNTER — Ambulatory Visit: Payer: BC Managed Care – PPO

## 2022-07-20 DIAGNOSIS — R072 Precordial pain: Secondary | ICD-10-CM | POA: Diagnosis not present

## 2022-07-20 DIAGNOSIS — R911 Solitary pulmonary nodule: Secondary | ICD-10-CM | POA: Insufficient documentation

## 2022-07-20 DIAGNOSIS — I2510 Atherosclerotic heart disease of native coronary artery without angina pectoris: Secondary | ICD-10-CM | POA: Insufficient documentation

## 2022-07-20 MED ORDER — NITROGLYCERIN 0.4 MG SL SUBL
0.8000 mg | SUBLINGUAL_TABLET | Freq: Once | SUBLINGUAL | Status: AC
  Administered 2022-07-20: 0.8 mg via SUBLINGUAL

## 2022-07-20 MED ORDER — IOHEXOL 350 MG/ML SOLN
95.0000 mL | Freq: Once | INTRAVENOUS | Status: AC | PRN
  Administered 2022-07-20: 95 mL via INTRAVENOUS

## 2022-07-20 MED ORDER — NITROGLYCERIN 0.4 MG SL SUBL
SUBLINGUAL_TABLET | SUBLINGUAL | Status: AC
  Filled 2022-07-20: qty 2

## 2022-07-22 ENCOUNTER — Ambulatory Visit: Payer: BC Managed Care – PPO | Attending: Family Medicine

## 2022-07-22 DIAGNOSIS — E349 Endocrine disorder, unspecified: Secondary | ICD-10-CM

## 2022-07-22 DIAGNOSIS — R072 Precordial pain: Secondary | ICD-10-CM | POA: Diagnosis not present

## 2022-07-24 NOTE — Progress Notes (Signed)
Called and reminded patient of 2/20 appointment. Let him know results would be reviewed at this appointment.

## 2022-07-28 ENCOUNTER — Ambulatory Visit: Payer: BC Managed Care – PPO | Admitting: Cardiology

## 2022-07-28 ENCOUNTER — Encounter: Payer: Self-pay | Admitting: Cardiology

## 2022-07-28 VITALS — BP 124/84 | HR 65 | Ht 67.0 in | Wt 200.0 lb

## 2022-07-28 DIAGNOSIS — E785 Hyperlipidemia, unspecified: Secondary | ICD-10-CM | POA: Diagnosis not present

## 2022-07-28 DIAGNOSIS — R931 Abnormal findings on diagnostic imaging of heart and coronary circulation: Secondary | ICD-10-CM | POA: Diagnosis not present

## 2022-07-28 DIAGNOSIS — K219 Gastro-esophageal reflux disease without esophagitis: Secondary | ICD-10-CM

## 2022-07-28 DIAGNOSIS — R072 Precordial pain: Secondary | ICD-10-CM

## 2022-07-28 DIAGNOSIS — I10 Essential (primary) hypertension: Secondary | ICD-10-CM

## 2022-07-28 DIAGNOSIS — Z8673 Personal history of transient ischemic attack (TIA), and cerebral infarction without residual deficits: Secondary | ICD-10-CM

## 2022-07-28 MED ORDER — ROSUVASTATIN CALCIUM 10 MG PO TABS: 10.0000 mg | ORAL_TABLET | Freq: Every evening | ORAL | 0 refills | Status: DC

## 2022-07-28 NOTE — Progress Notes (Signed)
ID:  Jesse Ho, DOB 04/04/1976, MRN [id number]  PCP:  Charlott Rakes, MD  Cardiologist:  Rex Kras, DO, [facility] (established care 12/31/2021) Former Cardiology Providers: Dr. Adrian Prows, Lawerance Cruel, [place]  Date: 07/28/22 Last Office Visit: 06/25/2022  Chief Complaint  Patient presents with   Precordial pain   Follow-up    HPI  Jesse Ho is a 47 y.o. African-American male whose  past medical history and cardiovascular risk factors include: Mild coronary artery calcification (total CAC 20.1, 87th percentile), hypertension, TIA, HLD.   Initially referred to the practice for elevated blood pressures.  With uptitration of medical therapy his blood pressures have improved.  In the recent past he had come in for sick visit for chest pain evaluation.  And shared decision was to proceed with additional workup.  Since last office visit he has undergone echocardiogram as well as a coronary CTA.  Results reviewed with him in great detail and noted below for further reference.  Clinically, still has nonspecific precordial pain that is reproducible with palpation and also worsened after exercise.  Located in the left intercostal space below the nipple.  No prior history of shingles/zoster.  ALLERGIES: Allergies  Allergen Reactions   Keflex [Cephalexin] Anaphylaxis    MEDICATION LIST PRIOR TO VISIT: Current Meds  Medication Sig   aspirin EC 81 MG tablet Take 1 tablet (81 mg total) by mouth daily. Swallow whole.   cetirizine (ZYRTEC) 10 MG tablet TAKE 1 TABLET BY MOUTH EVERY DAY (Patient taking differently: Take 10 mg by mouth daily as needed for allergies.)   colchicine 0.6 MG tablet TAKE 1 TABLET (0.6 MG TOTAL) BY MOUTH DAILY. TAKE FOR GOUT FLARE- TAKE FOR 3-5 DAYS   metoprolol tartrate (LOPRESSOR) 25 MG tablet TAKE 1 TABLET BY MOUTH TWICE A DAY   nitroGLYCERIN (NITROSTAT) 0.4 MG SL tablet Place 1 tablet (0.4 mg total) under the tongue every 5 (five) minutes as needed for chest  pain. If you require more than two tablets five minutes apart go to the nearest ER via EMS.   olmesartan-hydrochlorothiazide (BENICAR HCT) 40-25 MG tablet TAKE 1 TABLET BY MOUTH EVERY DAY   pantoprazole (PROTONIX) 40 MG tablet Take 1 tablet (40 mg total) by mouth daily.   rosuvastatin (CRESTOR) 10 MG tablet Take 1 tablet (10 mg total) by mouth at bedtime.   testosterone cypionate (DEPOTESTOSTERONE CYPIONATE) 200 MG/ML injection Inject 1 mL (200 mg total) into the muscle every 28 (twenty-eight) days.   [DISCONTINUED] pravastatin (PRAVACHOL) 80 MG tablet Take 1 tablet (80 mg total) by mouth every evening.   Current Facility-Administered Medications for the 07/28/22 encounter (Office Visit) with Rex Kras, DO  Medication   testosterone cypionate (DEPOTESTOTERONE CYPIONATE) injection 100 mg     PAST MEDICAL HISTORY: Past Medical History:  Diagnosis Date   Angina pectoris ([place]) 09/13/2018   Arthritis    played foot ball   Cervical dystonia    CVA (cerebral vascular accident) ([place])    mini stroke   Essential hypertension 09/13/2018   Mild hyperlipidemia 09/13/2018   Palpitations 09/13/2018    PAST SURGICAL HISTORY: Past Surgical History:  Procedure Laterality Date   CYSTECTOMY Left 10/19/2014   left branchial cleft cyst removed   FRACTURE SURGERY     fx fingers on left hand playing football   and bil L wrist    INSERTION OF MESH N/A 03/19/2017   Procedure: INSERTION OF MESH;  Surgeon: Greer Pickerel, MD;  Location: [facility];  Service: General;  Laterality: N/A;   TEE WITHOUT CARDIOVERSION N/A 10/27/2016   Procedure: TRANSESOPHAGEAL ECHOCARDIOGRAM (TEE);  Surgeon: Adrian Prows, MD;  Location: [facility];  Service: Cardiovascular;  Laterality: N/A;   UMBILICAL HERNIA REPAIR N/A 03/19/2017   Procedure: LAPAROSCOPIC SUPRA UMBILICAL AND UMBILICAL HERNIA REPAIR WITH MESH;  Surgeon: Greer Pickerel, MD;  Location: [facility];  Service: General;  Laterality: N/A;    FAMILY HISTORY: The patient family  history includes Diabetes in his sister and another family member; Hyperlipidemia in an other family member; Hypertension in an other family member; Obesity in an other family member.  SOCIAL HISTORY:  The patient  reports that he has never smoked. He has never used smokeless tobacco. He reports current alcohol use. He reports that he does not use drugs.  REVIEW OF SYSTEMS: Review of Systems  Cardiovascular:  Negative for chest pain, claudication, dyspnea on exertion, irregular heartbeat, leg swelling, near-syncope, orthopnea, palpitations, paroxysmal nocturnal dyspnea and syncope.  Respiratory:  Negative for shortness of breath.   Hematologic/Lymphatic: Negative for bleeding problem.  Musculoskeletal:  Negative for muscle cramps and myalgias.  Neurological:  Negative for dizziness and light-headedness.    PHYSICAL EXAM:    07/28/2022    9:48 AM 07/20/2022    8:20 AM 07/20/2022    7:54 AM  Vitals with BMI  Height 5' 7"$     Weight 200 lbs    BMI [id number]    Systolic [id number]  Diastolic 84 70 90  Pulse 65      Physical Exam  Constitutional: He appears healthy.  Neck: No JVD present.  Cardiovascular: Normal rate, regular rhythm, S1 normal, S2 normal, intact distal pulses and normal pulses. Exam reveals no gallop, no S3 and no S4.  No murmur heard. Pulmonary/Chest: Effort normal and breath sounds normal. No stridor. He has no wheezes. He has no rales.  Abdominal: Soft. Bowel sounds are normal. He exhibits no distension. There is no abdominal tenderness.  Musculoskeletal:        General: No edema. Normal range of motion.     Cervical back: Neck supple.  Neurological: He is alert and oriented to person, place, and time.  Skin: Skin is warm and moist.   CARDIAC DATABASE: EKG: 06/25/2022: Bradycardia, 47 bpm, LVH with voltage criteria without ST-T abnormality, TWI in the inferior leads cannot rule out ischemia.  Findings are similar to prior EKG dated  12/31/2021.  Echocardiogram: 07/08/2022: Normal LV systolic function with visual EF 60-65%. Left ventricle cavity is normal in size. Normal left ventricular wall thickness. Normal global wall motion. Normal diastolic filling pattern, normal LAP.  Trace tricuspid regurgitation.  Mild pulmonic regurgitation. Cannot rule out patent foramen ovale. Consider limited echocardiogram with bubble study for further evaluation. Clinical correlation required.  Compared to 02/06/2022 no significant change.    Stress Testing: Nuclear stress test  [12/28/2016]:  1. The resting electrocardiogram demonstrated normal sinus rhythm, normal resting conduction, no resting arrhythmias and normal rest repolarization. The stress electrocardiogram was normal. Patient exercised on Bruce protocol for 10:00 minutes and achieved 11.75 METS. Stress test terminated due to dyspnea, chest pain(6/10) and 85% MPHR achieved (Target HR >85%). Hypertensive both in rest and stress with peak BP of 190/92 mm Hg. 2. Left ventricular cavity is noted to be normal on the rest and stress studies. SPECT images demonstrate homogeneous tracer distribution throughout the myocardium. Gated SPECT imaging demonstrates global hypokinesis The left ventricular ejection fraction was calculated to be 40%. There appeared to be inferior and latral hypokinesis.  This in an intermediate risk study in view of excellent effort. Clinical correlation recommended.   Heart Catheterization: None  CCTA 07/20/2022: Coronary artery calcification score: Left main: 0 Left anterior descending artery: 20.1 Left circumflex artery: 0 Right coronary artery: 0  1. Total coronary calcium score of 20.1. This was 87th percentile for age and sex matched control.  2. Normal coronary origin with right dominance.  3. CAD-RADS = 1. Left Main: Patent. LAD: Minimal calcified plaque at the ostial LAD otherwise vessel ispatent. LCX: Patent. RCA: Patent.  Noncardiac findings:  No acute findings in the imaged extracardiac chest.   LABORATORY DATA:    Latest Ref Rng & Units 02/12/2022    3:16 PM 08/26/2020   10:36 AM 07/29/2017    8:32 PM  CBC  WBC 3.4 - 10.8 x10E3/uL 6.5  5.7  7.0   Hemoglobin 13.0 - 17.7 g/dL 13.1  14.9  14.1   Hematocrit 37.5 - 51.0 % 39.2  46.1  41.5   Platelets 150 - 450 x10E3/uL 282  288  209        Latest Ref Rng & Units 07/10/2022    1:34 PM 02/12/2022    3:16 PM 01/02/2022   10:31 AM  CMP  Glucose 70 - 99 mg/dL 110  132  137   BUN 6 - 24 mg/dL 15  13  13   $ Creatinine 0.76 - 1.27 mg/dL 1.18  1.11  1.27   Sodium 134 - 144 mmol/L 140  140  138   Potassium 3.5 - 5.2 mmol/L 4.1  4.3  4.5   Chloride 96 - 106 mmol/L 101  100  98   CO2 20 - 29 mmol/L 25  23  25   $ Calcium 8.7 - 10.2 mg/dL 9.5  9.8  10.1   Total Protein 6.0 - 8.5 g/dL 7.6   7.6   Total Bilirubin 0.0 - 1.2 mg/dL 1.0   1.2   Alkaline Phos 44 - 121 IU/L 71   69   AST 0 - 40 IU/L 23   20   ALT 0 - 44 IU/L 28   28     Lipid Panel  Lab Results  Component Value Date   CHOL 140 07/10/2022   HDL 29 (L) 07/10/2022   LDLCALC 88 07/10/2022   LDLDIRECT 92 07/10/2022   TRIG 129 07/10/2022   CHOLHDL 6.0 10/20/2016   No components found for: "NTPROBNP" No results for input(s): "PROBNP" in the last 8760 hours. No results for input(s): "TSH" in the last 8760 hours.  BMP Recent Labs    01/02/22 1031 02/12/22 1516 07/10/22 1334  NA 138 140 140  K 4.5 4.3 4.1  CL 98 100 101  CO2 25 23 25  $ GLUCOSE 137* 132* 110*  BUN 13 13 15  $ CREATININE 1.27 1.11 1.18  CALCIUM 10.1 9.8 9.5    HEMOGLOBIN A1C Lab Results  Component Value Date   HGBA1C 4.9 05/26/2022   MPG 85 10/20/2016    IMPRESSION:    ICD-10-CM   1. Agatston coronary artery calcium score less than 100  R93.1 rosuvastatin (CRESTOR) 10 MG tablet    Lipid Panel With LDL/HDL Ratio    LDL cholesterol, direct    CMP14+EGFR    2. Precordial pain  R07.2     3. Mild hyperlipidemia  E78.5     4. Essential  hypertension  I10     5. History of TIA (transient ischemic attack)  Z86.73     6. Gastroesophageal  reflux disease, unspecified whether esophagitis present  K21.9        RECOMMENDATIONS: Jesse Ho is a 47 y.o. African-American male whose past medical history and cardiac risk factors include: Mild coronary artery calcification (total CAC 20.1, 87th percentile), hypertension, TIA, HLD.   Agatston coronary artery calcium score less than 100 Precordial pain He continues to have precordial pain likely noncardiac. Coronary CTA results and echo results reviewed with the patient. No additional testing warranted at this time. Patient is made aware that his coronary calcium score in 2023 was 1 and now it is 20.  Despite reasonable lipid profile he would benefit from changing medical therapy.  Will discontinue pravastatin 80 mg p.o. nightly and transition him to rosuvastatin 10 mg p.o. nightly with fasting lipid profile check in 6 weeks.  No use of sublingual nitroglycerin tablets. Given his CAC, history of TIA, continue aspirin 81 mg p.o. daily  Mild hyperlipidemia Will transition pravastatin to Crestor He denies myalgia or other side effects. Most recent lipids dated February 2024, independently reviewed as noted above. Fasting lipid profile in 6 weeks after changing medical therapy.  Essential hypertension Office blood pressures are well-controlled. Medications reconciled.   History of TIA (transient ischemic attack) Educated him on the importance of secondary prevention. Continue aspirin and statin therapy.  FINAL MEDICATION LIST END OF ENCOUNTER: Meds ordered this encounter  Medications   rosuvastatin (CRESTOR) 10 MG tablet    Sig: Take 1 tablet (10 mg total) by mouth at bedtime.    Dispense:  90 tablet    Refill:  0    Medications Discontinued During This Encounter  Medication Reason   methylPREDNISolone (MEDROL DOSEPAK) 4 MG TBPK tablet Completed Course   pravastatin  (PRAVACHOL) 80 MG tablet Change in therapy     Current Outpatient Medications:    aspirin EC 81 MG tablet, Take 1 tablet (81 mg total) by mouth daily. Swallow whole., Disp: 90 tablet, Rfl: 3   cetirizine (ZYRTEC) 10 MG tablet, TAKE 1 TABLET BY MOUTH EVERY DAY (Patient taking differently: Take 10 mg by mouth daily as needed for allergies.), Disp: 90 tablet, Rfl: 1   colchicine 0.6 MG tablet, TAKE 1 TABLET (0.6 MG TOTAL) BY MOUTH DAILY. TAKE FOR GOUT FLARE- TAKE FOR 3-5 DAYS, Disp: 90 tablet, Rfl: 1   metoprolol tartrate (LOPRESSOR) 25 MG tablet, TAKE 1 TABLET BY MOUTH TWICE A DAY, Disp: 180 tablet, Rfl: 1   nitroGLYCERIN (NITROSTAT) 0.4 MG SL tablet, Place 1 tablet (0.4 mg total) under the tongue every 5 (five) minutes as needed for chest pain. If you require more than two tablets five minutes apart go to the nearest ER via EMS., Disp: 30 tablet, Rfl: 0   olmesartan-hydrochlorothiazide (BENICAR HCT) 40-25 MG tablet, TAKE 1 TABLET BY MOUTH EVERY DAY, Disp: 90 tablet, Rfl: 2   pantoprazole (PROTONIX) 40 MG tablet, Take 1 tablet (40 mg total) by mouth daily., Disp: 30 tablet, Rfl: 11   rosuvastatin (CRESTOR) 10 MG tablet, Take 1 tablet (10 mg total) by mouth at bedtime., Disp: 90 tablet, Rfl: 0   testosterone cypionate (DEPOTESTOSTERONE CYPIONATE) 200 MG/ML injection, Inject 1 mL (200 mg total) into the muscle every 28 (twenty-eight) days., Disp: 10 mL, Rfl: 5  Current Facility-Administered Medications:    testosterone cypionate (DEPOTESTOTERONE CYPIONATE) injection 100 mg, 100 mg, Intramuscular, Q28 days, Newlin, Enobong, MD, 100 mg at 07/22/22 [id number]  Orders Placed This Encounter  Procedures   Lipid Panel With LDL/HDL Ratio   LDL cholesterol, direct  CMP14+EGFR    There are no Patient Instructions on file for this visit.   --Continue cardiac medications as reconciled in final medication list. --Return in about 6 months (around 01/26/2023) for Follow up, Coronary artery calcification. or sooner  if needed. --Continue follow-up with your primary care physician regarding the management of your other chronic comorbid conditions.  Patient's questions and concerns were addressed to his satisfaction. He voices understanding of the instructions provided during this encounter.   This note was created using a voice recognition software as a result there may be grammatical errors inadvertently enclosed that do not reflect the nature of this encounter. Every attempt is made to correct such errors.  Rex Kras, [place], [facility]  Pager: [phone] Office: [phone]

## 2022-07-30 NOTE — Progress Notes (Signed)
Patient here  07/22/2022 for Testosterone injection.  Testosterone injection given today left deltoid.  Site unremarkable & patient tolerated injection.  Next injection due for 03/04 2024.

## 2022-08-20 MED ORDER — TESTOSTERONE CYPIONATE 200 MG/ML IM SOLN: 200.0000 mg | Freq: Once | INTRAMUSCULAR | 0 refills | Status: AC

## 2022-08-21 ENCOUNTER — Ambulatory Visit: Payer: Self-pay

## 2022-08-21 NOTE — Telephone Encounter (Signed)
  Chief Complaint: chest pain Symptoms: L sided chest pain, present 6/10, constant just gets worse at times Frequency: 6-7 months Pertinent Negatives: Patient denies SOB, dizziness Disposition: [] ED /[] Urgent Care (no appt availability in office) / [x] Appointment(In office/virtual)/ []  [place] [facility]/ [] Home Care/ [] Refused Recommended Disposition /[] [facility]/ []  Follow-up with PCP Additional Notes: pt states he has been seen by Cardiology and they didn't find anything and recommended he FU with PCP. Scheduled OV for 08/24/22 at 1530 d/t first available.   Reason for Disposition  [1] Chest pain from known angina comes and goes AND [2] is NOT happening more often (increasing in frequency) or getting worse (increasing in severity)  Answer Assessment - Initial Assessment Questions 1. LOCATION: "Where does it hurt?"       L sided chest  2. RADIATION: "Does the pain go anywhere else?" (e.g., into neck, jaw, arms, back)     Back  3. ONSET: "When did the chest pain begin?" (Minutes, hours or days)      6-7 months  4. PATTERN: "Does the pain come and go, or has it been constant since it started?"  "Does it get worse with exertion?"      Constant just worse at times  6. SEVERITY: "How bad is the pain?"  (e.g., Scale 1-10; mild, moderate, or severe)    - MILD (1-3): doesn't interfere with normal activities     - MODERATE (4-7): interferes with normal activities or awakens from sleep    - SEVERE (8-10): excruciating pain, unable to do any normal activities       6-7 7. CARDIAC RISK FACTORS: "Do you have any history of heart problems or risk factors for heart disease?" (e.g., angina, prior heart attack; diabetes, high blood pressure, high cholesterol, smoker, or strong family history of heart disease)     HLD, HTN  10. OTHER SYMPTOMS: "Do you have any other symptoms?" (e.g., dizziness, nausea, vomiting, sweating, fever, difficulty breathing, cough)       no  Protocols used:  Chest Pain-A-AH

## 2022-08-24 ENCOUNTER — Ambulatory Visit: Payer: BC Managed Care – PPO | Admitting: Internal Medicine

## 2022-08-25 ENCOUNTER — Telehealth: Payer: Self-pay | Admitting: *Deleted

## 2022-08-25 NOTE — Telephone Encounter (Signed)
Late entry- 08/24/2022 Patient left appointment yesterday after waiting in lobby for per patient 40 minutes.  He was told that he would have a wait in his room for the provider. He stated that he was at work and did not plan to take off so long.   A virtual appointment was offered but patient rejected because he was on his way back to work.   Patient stated that he did not wish to continue services with the [facility] office for personal reasons but  he disclosed because it was a community clinic site. He stated he also has to wait along time  most times when has an appt.   Asked patient if he would like to transfer [facility] he states it would be too far out.   He stated he will probably transfer back to an. [facility] office.

## 2022-08-28 ENCOUNTER — Ambulatory Visit: Payer: BC Managed Care – PPO | Attending: Family Medicine

## 2022-08-28 DIAGNOSIS — E349 Endocrine disorder, unspecified: Secondary | ICD-10-CM

## 2022-08-28 MED ORDER — TESTOSTERONE CYPIONATE 200 MG/ML IM SOLN
200.0000 mg | Freq: Once | INTRAMUSCULAR | Status: AC
  Administered 2022-08-28: 200 mg via INTRAMUSCULAR

## 2022-08-28 MED ORDER — TESTOSTERONE CYPIONATE 200 MG/ML IM SOLN: 200.0000 mg | Freq: Once | INTRAMUSCULAR | 0 refills | Status: DC

## 2022-08-28 MED ORDER — TESTOSTERONE CYPIONATE 200 MG/ML IM SOLN: 200.0000 mg | Freq: Once | INTRAMUSCULAR | 0 refills | Status: AC

## 2022-08-28 NOTE — Progress Notes (Signed)
Patient here  08/28/2022 for Testosterone injection.  Testosterone injection given today left deltoid.  Site unremarkable & patient tolerated injection.  Next injection due for 10/01/2022.

## 2022-09-03 DIAGNOSIS — M25562 Pain in left knee: Secondary | ICD-10-CM | POA: Diagnosis not present

## 2022-09-08 DIAGNOSIS — S76312A Strain of muscle, fascia and tendon of the posterior muscle group at thigh level, left thigh, initial encounter: Secondary | ICD-10-CM | POA: Diagnosis not present

## 2022-09-08 DIAGNOSIS — M25562 Pain in left knee: Secondary | ICD-10-CM | POA: Diagnosis not present

## 2022-09-20 ENCOUNTER — Other Ambulatory Visit: Payer: Self-pay | Admitting: Cardiology

## 2022-09-20 DIAGNOSIS — E785 Hyperlipidemia, unspecified: Secondary | ICD-10-CM

## 2022-09-20 DIAGNOSIS — Z8673 Personal history of transient ischemic attack (TIA), and cerebral infarction without residual deficits: Secondary | ICD-10-CM

## 2022-09-23 ENCOUNTER — Ambulatory Visit: Payer: BC Managed Care – PPO | Admitting: Physician Assistant

## 2022-10-01 ENCOUNTER — Ambulatory Visit: Payer: BC Managed Care – PPO

## 2022-10-03 ENCOUNTER — Other Ambulatory Visit: Payer: Self-pay | Admitting: Cardiology

## 2022-10-03 DIAGNOSIS — R072 Precordial pain: Secondary | ICD-10-CM

## 2022-10-08 ENCOUNTER — Ambulatory Visit: Payer: BC Managed Care – PPO | Admitting: Physician Assistant

## 2022-10-13 DIAGNOSIS — R0789 Other chest pain: Secondary | ICD-10-CM | POA: Diagnosis not present

## 2022-10-13 DIAGNOSIS — R252 Cramp and spasm: Secondary | ICD-10-CM | POA: Diagnosis not present

## 2022-10-13 DIAGNOSIS — R739 Hyperglycemia, unspecified: Secondary | ICD-10-CM | POA: Diagnosis not present

## 2022-10-13 DIAGNOSIS — I10 Essential (primary) hypertension: Secondary | ICD-10-CM | POA: Diagnosis not present

## 2022-10-19 ENCOUNTER — Ambulatory Visit: Payer: BC Managed Care – PPO | Admitting: Physician Assistant

## 2022-10-23 ENCOUNTER — Other Ambulatory Visit: Payer: Self-pay | Admitting: Cardiology

## 2022-10-23 DIAGNOSIS — R931 Abnormal findings on diagnostic imaging of heart and coronary circulation: Secondary | ICD-10-CM

## 2022-11-13 ENCOUNTER — Encounter: Payer: Self-pay | Admitting: Cardiology

## 2022-11-13 ENCOUNTER — Ambulatory Visit: Payer: BC Managed Care – PPO | Admitting: Cardiology

## 2022-11-13 VITALS — BP 132/89 | HR 61 | Ht 67.0 in | Wt 200.0 lb

## 2022-11-13 DIAGNOSIS — R931 Abnormal findings on diagnostic imaging of heart and coronary circulation: Secondary | ICD-10-CM | POA: Diagnosis not present

## 2022-11-13 DIAGNOSIS — E785 Hyperlipidemia, unspecified: Secondary | ICD-10-CM

## 2022-11-13 DIAGNOSIS — R072 Precordial pain: Secondary | ICD-10-CM

## 2022-11-13 DIAGNOSIS — Z8673 Personal history of transient ischemic attack (TIA), and cerebral infarction without residual deficits: Secondary | ICD-10-CM | POA: Diagnosis not present

## 2022-11-13 DIAGNOSIS — I10 Essential (primary) hypertension: Secondary | ICD-10-CM

## 2022-11-13 DIAGNOSIS — K219 Gastro-esophageal reflux disease without esophagitis: Secondary | ICD-10-CM

## 2022-11-13 NOTE — Progress Notes (Signed)
ID:  Jesse Ho, DOB [birth year], MRN [id number]  PCP:  Hoy Register, MD  Cardiologist:  Tessa Lerner, DO, [facility] (established care 12/31/2021) Former Cardiology Providers: Dr. Yates Decamp, Elvin So, [place]  Date: 11/13/22 Last Office Visit: 07/28/2022  Chief Complaint  Patient presents with   Follow-up   Chest Pain    HPI  Jesse Ho is a 47 y.o. African-American male whose  past medical history and cardiovascular risk factors include: Mild coronary artery calcification (total CAC 20.1, 87th percentile), hypertension, TIA, HLD.   Initially referred to the practice for benign essential hypertension.  With uptitration of medical therapy as home blood pressures have improved.  However, in the past he had voiced concerns for chest pain and did undergo coronary CTA.  He was asked to follow-up on a yearly basis.  However, he comes sooner due to recent episode of precordial discomfort.  Approximately 2 weeks ago patient states that he was having left-sided precordial discomfort around pectoralis major muscle.  The symptoms were constant for 1 week and then self-limited.  The symptoms are not brought on by effort related activities, does not resolve with rest.  No recent musculoskeletal injury.  He is currently asymptomatic.  ALLERGIES: Allergies  Allergen Reactions   Keflex [Cephalexin] Anaphylaxis    MEDICATION LIST PRIOR TO VISIT: Current Meds  Medication Sig   aspirin EC 81 MG tablet Take 1 tablet (81 mg total) by mouth daily. Swallow whole.   cetirizine (ZYRTEC) 10 MG tablet TAKE 1 TABLET BY MOUTH EVERY DAY (Patient taking differently: Take 10 mg by mouth daily as needed for allergies.)   metoprolol tartrate (LOPRESSOR) 25 MG tablet TAKE 1 TABLET BY MOUTH TWICE A DAY   nitroGLYCERIN (NITROSTAT) 0.4 MG SL tablet Place 1 tablet (0.4 mg total) under the tongue every 5 (five) minutes as needed for chest pain. If you require more than two tablets five minutes apart go to the  nearest ER via EMS.   olmesartan-hydrochlorothiazide (BENICAR HCT) 40-25 MG tablet TAKE 1 TABLET BY MOUTH EVERY DAY   pantoprazole (PROTONIX) 40 MG tablet Take 1 tablet (40 mg total) by mouth daily.   rosuvastatin (CRESTOR) 10 MG tablet TAKE 1 TABLET BY MOUTH EVERYDAY AT BEDTIME     PAST MEDICAL HISTORY: Past Medical History:  Diagnosis Date   Angina pectoris (HCC) 09/13/2018   Arthritis    played foot ball   Cervical dystonia    CVA (cerebral vascular accident) (HCC)    mini stroke   Essential hypertension 09/13/2018   Mild hyperlipidemia 09/13/2018   Palpitations 09/13/2018    PAST SURGICAL HISTORY: Past Surgical History:  Procedure Laterality Date   CYSTECTOMY Left 10/19/2014   left branchial cleft cyst removed   FRACTURE SURGERY     fx fingers on left hand playing football   and bil L wrist    INSERTION OF MESH N/A 03/19/2017   Procedure: INSERTION OF MESH;  Surgeon: Gaynelle Adu, MD;  Location: [facility];  Service: General;  Laterality: N/A;   TEE WITHOUT CARDIOVERSION N/A 10/27/2016   Procedure: TRANSESOPHAGEAL ECHOCARDIOGRAM (TEE);  Surgeon: Yates Decamp, MD;  Location: [facility] ENDOSCOPY;  Service: Cardiovascular;  Laterality: N/A;   UMBILICAL HERNIA REPAIR N/A 03/19/2017   Procedure: LAPAROSCOPIC SUPRA UMBILICAL AND UMBILICAL HERNIA REPAIR WITH MESH;  Surgeon: Gaynelle Adu, MD;  Location: [facility];  Service: General;  Laterality: N/A;    FAMILY HISTORY: The patient family history includes Diabetes in his sister and another family member; Hyperlipidemia in  an other family member; Hypertension in an other family member; Obesity in an other family member.  SOCIAL HISTORY:  The patient  reports that he has never smoked. He has never used smokeless tobacco. He reports current alcohol use. He reports that he does not use drugs.  REVIEW OF SYSTEMS: Review of Systems  Cardiovascular:  Positive for chest pain (none now - see HPI). Negative for claudication, dyspnea on exertion, irregular  heartbeat, leg swelling, near-syncope, orthopnea, palpitations, paroxysmal nocturnal dyspnea and syncope.  Respiratory:  Negative for shortness of breath.   Hematologic/Lymphatic: Negative for bleeding problem.  Musculoskeletal:  Negative for muscle cramps and myalgias.  Neurological:  Negative for dizziness and light-headedness.    PHYSICAL EXAM:    11/13/2022   10:36 AM 11/13/2022   10:28 AM 07/28/2022    9:48 AM  Vitals with BMI  Height  5\' 7"  5\' 7"   Weight  200 lbs 200 lbs  BMI  31.32 31.32  Systolic 132 144 161  Diastolic 89 92 84  Pulse 61 62 65    Physical Exam  Constitutional: He appears healthy.  Neck: No JVD present.  Cardiovascular: Normal rate, regular rhythm, S1 normal, S2 normal, intact distal pulses and normal pulses. Exam reveals no gallop, no S3 and no S4.  No murmur heard. Pulmonary/Chest: Effort normal and breath sounds normal. No stridor. He has no wheezes. He has no rales.  Abdominal: Soft. Bowel sounds are normal. He exhibits no distension. There is no abdominal tenderness.  Musculoskeletal:        General: No edema. Normal range of motion.     Cervical back: Neck supple.  Neurological: He is alert and oriented to person, place, and time.  Skin: Skin is warm and moist.   CARDIAC DATABASE: EKG: 06/25/2022: Bradycardia, 47 bpm, LVH with voltage criteria without ST-T abnormality, TWI in the inferior leads cannot rule out ischemia.  Findings are similar to prior EKG dated 12/31/2021.  November 13, 2022: Sinus bradycardia, 53 bpm, without underlying ischemia or injury pattern, LVH per voltage criteria.  Echocardiogram: 07/08/2022: Normal LV systolic function with visual EF 60-65%. Left ventricle cavity is normal in size. Normal left ventricular wall thickness. Normal global wall motion. Normal diastolic filling pattern, normal LAP.  Trace tricuspid regurgitation.  Mild pulmonic regurgitation. Cannot rule out patent foramen ovale. Consider limited echocardiogram with  bubble study for further evaluation. Clinical correlation required.  Compared to 02/06/2022 no significant change.    Stress Testing: Nuclear stress test  [12/28/2016]:  1. The resting electrocardiogram demonstrated normal sinus rhythm, normal resting conduction, no resting arrhythmias and normal rest repolarization. The stress electrocardiogram was normal. Patient exercised on Bruce protocol for 10:00 minutes and achieved 11.75 METS. Stress test terminated due to dyspnea, chest pain(6/10) and 85% MPHR achieved (Target HR >85%). Hypertensive both in rest and stress with peak BP of 190/92 mm Hg. 2. Left ventricular cavity is noted to be normal on the rest and stress studies. SPECT images demonstrate homogeneous tracer distribution throughout the myocardium. Gated SPECT imaging demonstrates global hypokinesis The left ventricular ejection fraction was calculated to be 40%. There appeared to be inferior and latral hypokinesis. This in an intermediate risk study in view of excellent effort. Clinical correlation recommended.   Heart Catheterization: None  CCTA 07/20/2022: Coronary artery calcification score: Left main: 0 Left anterior descending artery: 20.1 Left circumflex artery: 0 Right coronary artery: 0  1. Total coronary calcium score of 20.1. This was 87th percentile for age and sex matched  control.  2. Normal coronary origin with right dominance.  3. CAD-RADS = 1. Left Main: Patent. LAD: Minimal calcified plaque at the ostial LAD otherwise vessel ispatent. LCX: Patent. RCA: Patent.  Noncardiac findings: No acute findings in the imaged extracardiac chest.   LABORATORY DATA:    Latest Ref Rng & Units 02/12/2022    3:16 PM 08/26/2020   10:36 AM 07/29/2017    8:32 PM  CBC  WBC 3.4 - 10.8 x10E3/uL 6.5  5.7  7.0   Hemoglobin 13.0 - 17.7 g/dL 46.9  62.9  52.8   Hematocrit 37.5 - 51.0 % 39.2  46.1  41.5   Platelets 150 - 450 x10E3/uL 282  288  209        Latest Ref Rng & Units  07/10/2022    1:34 PM 02/12/2022    3:16 PM 01/02/2022   10:31 AM  CMP  Glucose 70 - 99 mg/dL 413  244  010   BUN 6 - 24 mg/dL 15  13  13    Creatinine 0.76 - 1.27 mg/dL 2.72  5.36  6.44   Sodium 134 - 144 mmol/L 140  140  138   Potassium 3.5 - 5.2 mmol/L 4.1  4.3  4.5   Chloride 96 - 106 mmol/L 101  100  98   CO2 20 - 29 mmol/L 25  23  25    Calcium 8.7 - 10.2 mg/dL 9.5  9.8  03.4   Total Protein 6.0 - 8.5 g/dL 7.6   7.6   Total Bilirubin 0.0 - 1.2 mg/dL 1.0   1.2   Alkaline Phos 44 - 121 IU/L 71   69   AST 0 - 40 IU/L 23   20   ALT 0 - 44 IU/L 28   28     Lipid Panel  Lab Results  Component Value Date   CHOL 140 07/10/2022   HDL 29 (L) 07/10/2022   LDLCALC 88 07/10/2022   LDLDIRECT 92 07/10/2022   TRIG 129 07/10/2022   CHOLHDL 6.0 10/20/2016   No components found for: "NTPROBNP" No results for input(s): "PROBNP" in the last 8760 hours. No results for input(s): "TSH" in the last 8760 hours.  BMP Recent Labs    01/02/22 1031 02/12/22 1516 07/10/22 1334  NA 138 140 140  K 4.5 4.3 4.1  CL 98 100 101  CO2 25 23 25   GLUCOSE 137* 132* 110*  BUN 13 13 15   CREATININE 1.27 1.11 1.18  CALCIUM 10.1 9.8 9.5    HEMOGLOBIN A1C Lab Results  Component Value Date   HGBA1C 4.9 05/26/2022   MPG 85 10/20/2016    IMPRESSION:    ICD-10-CM   1. Precordial pain  R07.2 EKG 12-Lead    2. Agatston coronary artery calcium score less than 100  R93.1     3. Mild hyperlipidemia  E78.5     4. Essential hypertension  I10     5. History of TIA (transient ischemic attack)  Z86.73     6. Gastroesophageal reflux disease, unspecified whether esophagitis present  K21.9        RECOMMENDATIONS: KEHINDE PAREKH is a 47 y.o. African-American male whose past medical history and cardiac risk factors include: Mild coronary artery calcification (total CAC 20.1, 87th percentile), hypertension, TIA, HLD.   Agatston coronary artery calcium score less than 100 Precordial pain Precordial  discomfort approximately 2 weeks ago which was constant, not brought on by effort related activities, did not resolve with rest. Likely  noncardiac.  Currently asymptomatic. EKG shows sinus rhythm without underlying ischemia or injury pattern. Recently had a coronary CTA and echocardiogram results reviewed we reviewed at today's visit to help medical decision making. No use of sublingual nitroglycerin tablets since the last office visit. Given his CAC, history of TIA, continue aspirin 81 mg p.o. daily  Mild hyperlipidemia In the past transition to from pravastatin to Crestor He denies myalgia or other side effects. Follow-up lipid profile is pending.  Essential hypertension Office blood pressures are acceptable. I have asked him to keep a log of his blood pressures at home.  To see if additional medication titration is warranted.  She will follow-up with PCP. Medications reconciled.  History of TIA (transient ischemic attack) Educated him on the importance of secondary prevention. He currently wears an iWatch which has the capability of doing EKGs.  I have asked him to capture EKGs if and when he has episodes of palpitation. Continue aspirin and statin therapy.  FINAL MEDICATION LIST END OF ENCOUNTER: No orders of the defined types were placed in this encounter.   Medications Discontinued During This Encounter  Medication Reason   colchicine 0.6 MG tablet Completed Course     Current Outpatient Medications:    aspirin EC 81 MG tablet, Take 1 tablet (81 mg total) by mouth daily. Swallow whole., Disp: 90 tablet, Rfl: 3   cetirizine (ZYRTEC) 10 MG tablet, TAKE 1 TABLET BY MOUTH EVERY DAY (Patient taking differently: Take 10 mg by mouth daily as needed for allergies.), Disp: 90 tablet, Rfl: 1   metoprolol tartrate (LOPRESSOR) 25 MG tablet, TAKE 1 TABLET BY MOUTH TWICE A DAY, Disp: 180 tablet, Rfl: 1   nitroGLYCERIN (NITROSTAT) 0.4 MG SL tablet, Place 1 tablet (0.4 mg total) under the  tongue every 5 (five) minutes as needed for chest pain. If you require more than two tablets five minutes apart go to the nearest ER via EMS., Disp: 30 tablet, Rfl: 0   olmesartan-hydrochlorothiazide (BENICAR HCT) 40-25 MG tablet, TAKE 1 TABLET BY MOUTH EVERY DAY, Disp: 90 tablet, Rfl: 2   pantoprazole (PROTONIX) 40 MG tablet, Take 1 tablet (40 mg total) by mouth daily., Disp: 30 tablet, Rfl: 11   rosuvastatin (CRESTOR) 10 MG tablet, TAKE 1 TABLET BY MOUTH EVERYDAY AT BEDTIME, Disp: 90 tablet, Rfl: 0  Orders Placed This Encounter  Procedures   EKG 12-Lead    There are no Patient Instructions on file for this visit.   --Continue cardiac medications as reconciled in final medication list. --Return in about 1 year (around 11/13/2023) for Follow up, Coronary artery calcification. or sooner if needed. --Continue follow-up with your primary care physician regarding the management of your other chronic comorbid conditions.  Patient's questions and concerns were addressed to his satisfaction. He voices understanding of the instructions provided during this encounter.   This note was created using a voice recognition software as a result there may be grammatical errors inadvertently enclosed that do not reflect the nature of this encounter. Every attempt is made to correct such errors.   Tessa Lerner, [place], [facility]  Pager:  [phone] Office: [phone]

## 2022-11-19 ENCOUNTER — Other Ambulatory Visit: Payer: Self-pay | Admitting: Family Medicine

## 2022-11-19 DIAGNOSIS — R1310 Dysphagia, unspecified: Secondary | ICD-10-CM

## 2022-11-21 ENCOUNTER — Other Ambulatory Visit: Payer: Self-pay | Admitting: Family Medicine

## 2022-11-21 DIAGNOSIS — K5909 Other constipation: Secondary | ICD-10-CM

## 2022-11-23 NOTE — Telephone Encounter (Signed)
Unable to refill per protocol, Rx expired. Discontinued 06/25/22.  Requested Prescriptions  Pending Prescriptions Disp Refills   lubiprostone (AMITIZA) 8 MCG capsule [Pharmacy Med Name: LUBIPROSTONE 8 MCG CAPSULE] 180 capsule 1    Sig: TAKE 1 CAPSULE (8 MCG TOTAL) BY MOUTH 2 (TWO) TIMES DAILY WITH A MEAL.     Gastroenterology: Irritable Bowel Syndrome - lubiprostone Passed - 11/21/2022 12:15 AM      Passed - AST in normal range and within 360 days    AST  Date Value Ref Range Status  07/10/2022 23 0 - 40 IU/L Final         Passed - ALT in normal range and within 360 days    ALT  Date Value Ref Range Status  07/10/2022 28 0 - 44 IU/L Final         Passed - Valid encounter within last 12 months    Recent Outpatient Visits           5 months ago Testosterone deficiency   [facility] Hoy Register, MD   6 months ago Sinus headache   [facility] Hoy Register, MD       Future Appointments             In 3 days Hoy Register, MD [facility]

## 2022-11-26 ENCOUNTER — Ambulatory Visit: Payer: BC Managed Care – PPO | Admitting: Family Medicine

## 2022-11-30 ENCOUNTER — Other Ambulatory Visit: Payer: BC Managed Care – PPO

## 2022-12-02 ENCOUNTER — Ambulatory Visit
Admission: RE | Admit: 2022-12-02 | Discharge: 2022-12-02 | Disposition: A | Discharge status: Home / Self Care | Payer: BC Managed Care – PPO | Source: Ambulatory Visit | Attending: Family Medicine | Admitting: Family Medicine

## 2022-12-02 DIAGNOSIS — R1310 Dysphagia, unspecified: Secondary | ICD-10-CM | POA: Diagnosis not present

## 2022-12-02 DIAGNOSIS — R0789 Other chest pain: Secondary | ICD-10-CM | POA: Diagnosis not present

## 2022-12-02 DIAGNOSIS — R12 Heartburn: Secondary | ICD-10-CM | POA: Diagnosis not present

## 2023-01-19 DIAGNOSIS — K219 Gastro-esophageal reflux disease without esophagitis: Secondary | ICD-10-CM | POA: Diagnosis not present

## 2023-01-19 DIAGNOSIS — M25531 Pain in right wrist: Secondary | ICD-10-CM | POA: Diagnosis not present

## 2023-01-19 DIAGNOSIS — K5900 Constipation, unspecified: Secondary | ICD-10-CM | POA: Diagnosis not present

## 2023-01-19 DIAGNOSIS — Z86010 Personal history of colonic polyps: Secondary | ICD-10-CM | POA: Diagnosis not present

## 2023-01-27 DIAGNOSIS — M25531 Pain in right wrist: Secondary | ICD-10-CM | POA: Diagnosis not present

## 2023-02-07 ENCOUNTER — Ambulatory Visit (HOSPITAL_COMMUNITY)
Admission: EM | Admit: 2023-02-07 | Discharge: 2023-02-07 | Disposition: A | Discharge status: Home / Self Care | Payer: BC Managed Care – PPO

## 2023-02-07 ENCOUNTER — Encounter (HOSPITAL_COMMUNITY): Payer: Self-pay | Admitting: Emergency Medicine

## 2023-02-07 DIAGNOSIS — S0501XA Injury of conjunctiva and corneal abrasion without foreign body, right eye, initial encounter: Secondary | ICD-10-CM

## 2023-02-07 DIAGNOSIS — H0011 Chalazion right upper eyelid: Secondary | ICD-10-CM | POA: Diagnosis not present

## 2023-02-07 MED ORDER — EYE WASH OP SOLN
OPHTHALMIC | Status: AC
  Filled 2023-02-07: qty 118

## 2023-02-07 MED ORDER — TETRACAINE HCL 0.5 % OP SOLN
OPHTHALMIC | Status: AC
  Filled 2023-02-07: qty 4

## 2023-02-07 MED ORDER — FLUORESCEIN SODIUM 1 MG OP STRP
ORAL_STRIP | OPHTHALMIC | Status: AC
  Filled 2023-02-07: qty 1

## 2023-02-07 MED ORDER — BACITRACIN 500 UNIT/GM OP OINT: TOPICAL_OINTMENT | Freq: Two times a day (BID) | OPHTHALMIC | 0 refills | Status: AC

## 2023-02-07 NOTE — ED Provider Notes (Signed)
Eye exam completed [facility]    CSN: [id number] Arrival date & time: 02/07/23  1137      History   Chief Complaint Chief Complaint  Patient presents with   Facial Swelling    HPI Jesse Ho is a 47 y.o. male.  Patient reports waking up 2 days ago with swelling of the right eyelid.  He denies any eye discharge or redness.  He does report that it feels like there is a stone or grittiness in his eye.  The history is provided by the patient.  Eye Pain This is a new problem. The current episode started 2 days ago. The problem occurs constantly. The problem has not changed since onset.He has tried nothing for the symptoms.    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   Left-sided weakness 11/04/2016   Left facial numbness 10/20/2016   TIA (transient ischemic attack)    Cervical dystonia 05/20/2016   Contusion of left wrist 11/05/2015    Past Surgical History:  Procedure Laterality Date   CYSTECTOMY Left 10/19/2014   left branchial cleft cyst removed   FRACTURE SURGERY     fx fingers on left hand playing football   and bil L wrist    INSERTION OF MESH N/A 03/19/2017   Procedure: INSERTION OF MESH;  Surgeon: Gaynelle Adu, MD;  Location: [facility];  Service: General;  Laterality: N/A;   TEE WITHOUT CARDIOVERSION N/A 10/27/2016   Procedure: TRANSESOPHAGEAL ECHOCARDIOGRAM (TEE);  Surgeon: Yates Decamp, MD;  Location: [facility] ENDOSCOPY;  Service: Cardiovascular;  Laterality: N/A;    UMBILICAL HERNIA REPAIR N/A 03/19/2017   Procedure: LAPAROSCOPIC SUPRA UMBILICAL AND UMBILICAL HERNIA REPAIR WITH MESH;  Surgeon: Gaynelle Adu, MD;  Location: [facility];  Service: General;  Laterality: N/A;       Home Medications    Prior to Admission medications   Medication Sig Start Date End Date Taking? Authorizing Provider  bacitracin ophthalmic ointment Place into the right eye in the morning and at bedtime for 5 days. apply to eye 02/07/23 02/12/23 Yes Emmagrace Runkel, Linde Gillis, NP  meloxicam (MOBIC) 7.5 MG tablet Take 7.5 mg by mouth daily. 01/19/23  Yes [provider]  aspirin EC 81 MG tablet Take 1 tablet (81 mg total) by mouth daily. Swallow whole. 02/16/22 02/16/23  Tolia, Sunit, DO  cetirizine (ZYRTEC) 10 MG tablet TAKE 1 TABLET BY MOUTH EVERY DAY Patient taking differently: Take 10 mg by mouth daily as needed for allergies. 07/17/22   Hoy Register, MD  metoprolol tartrate (LOPRESSOR) 25 MG tablet TAKE 1 TABLET BY MOUTH TWICE A DAY 10/05/22   Tolia, Sunit, DO  nitroGLYCERIN (NITROSTAT) 0.4 MG SL tablet Place 1 tablet (0.4 mg total) under the tongue every 5 (five) minutes as needed for chest pain. If you require more than two tablets five minutes apart go to the nearest ER via EMS. 06/25/22 11/13/22  Tolia, Sunit, DO  olmesartan-hydrochlorothiazide (BENICAR HCT) 40-25 MG tablet TAKE 1 TABLET  BY MOUTH EVERY DAY 01/26/22   Tolia, Sunit, DO  pantoprazole (PROTONIX) 40 MG tablet Take 1 tablet (40 mg total) by mouth daily. 06/25/22   Tolia, Sunit, DO  rosuvastatin (CRESTOR) 10 MG tablet TAKE 1 TABLET BY MOUTH EVERYDAY AT BEDTIME 10/23/22   Tolia, Sunit, DO    Family History Family History  Problem Relation Age of Onset   Hyperlipidemia Other    Hypertension Other    Diabetes Other    Obesity Other    Diabetes Sister     Social History Social History   Tobacco Use   Smoking status: Never   Smokeless tobacco: Never  Vaping Use   Vaping status: Never Used  Substance Use Topics    Alcohol use: Yes    Comment: social   Drug use: No     Allergies   Keflex [cephalexin]   Review of Systems Review of Systems  Eyes:  Positive for pain.  All other systems reviewed and are negative.    Physical Exam Triage Vital Signs ED Triage Vitals  Encounter Vitals Group     BP 02/07/23 1257 (!) 144/92     Systolic BP Percentile --      Diastolic BP Percentile --      Pulse Rate 02/07/23 1257 (!) 57     Resp 02/07/23 1257 16     Temp 02/07/23 1257 98.1 F (36.7 C)     Temp Source 02/07/23 1257 Oral     SpO2 02/07/23 1257 97 %     Weight --      Height --      Head Circumference --      Peak Flow --      Pain Score 02/07/23 1258 8     Pain Loc --      Pain Education --      Exclude from Growth Chart --    No data found.  Updated Vital Signs BP (!) 144/92 (BP Location: Left Arm)   Pulse (!) 57   Temp 98.1 F (36.7 C) (Oral)   Resp 16   SpO2 97%   Visual Acuity Right Eye Distance:   Left Eye Distance:   Bilateral Distance:    Right Eye Near:   Left Eye Near:    Bilateral Near:     Physical Exam Vitals and nursing note reviewed.  Constitutional:      Appearance: Normal appearance.  Eyes:     General: Lids are everted, no foreign bodies appreciated. Vision grossly intact. Gaze aligned appropriately.     Extraocular Movements: Extraocular movements intact.     Conjunctiva/sclera: Conjunctivae normal.     Pupils: Pupils are equal, round, and reactive to light.     Comments: Eye exam showed small 1/4 cm abrasion to the right lower cornea.  There is noted chalazion on the right mid/outer upper lid.  Neurological:     Mental Status: He is alert.      UC Treatments / Results  Labs (all labs ordered are listed, but only abnormal results are displayed) Labs Reviewed - No data to display  EKG   Radiology No results found.  Procedures Procedures (including critical care time)  Medications Ordered in UC Medications - No data to  display  Initial Impression / Assessment and Plan / UC Course  I have reviewed the triage vital signs and the nursing notes.  Pertinent labs & imaging results that were available during my care of the patient were reviewed by me and  considered in my medical decision making (see chart for details).  Eye exam completed.  Small chalazion noted on top outer eyelid.  On fluorescein exam a small corneal abrasion was noted.  Medicated with bacitracin as erythromycin was not on his formulary.  Patient also advised to use warm compresses to eyelid.  Patient is advised to follow-up with ophthalmology if symptoms do not improve.    Final Clinical Impressions(s) / UC Diagnoses   Final diagnoses:  Chalazion of right upper eyelid  Abrasion of right cornea, initial encounter     Discharge Instructions      Use warm compresses to the eye. Place ointment on lower eye lid twice daily.  Use antibiotic ointment for 5 days.  If continued issues or worsening treatments please follow-up with a ophthalmologist.     ED Prescriptions     Medication Sig Dispense Auth. Provider   bacitracin ophthalmic ointment Place into the right eye in the morning and at bedtime for 5 days. apply to eye 3.5 g Garlan Drewes, Linde Gillis, NP      PDMP not reviewed this encounter.   Nelda Marseille, NP 02/07/23 1815

## 2023-02-07 NOTE — Discharge Instructions (Addendum)
Use warm compresses to the eye. Place ointment on lower eye lid twice daily.  Use antibiotic ointment for 5 days.  If continued issues or worsening treatments please follow-up with a ophthalmologist.

## 2023-02-07 NOTE — ED Triage Notes (Signed)
Pt reports since Thursday/Friday had right eye swelling. Denies visual impairment, or injury. Pt reports feels like has rocks in his eye. Wears glasses.

## 2023-02-16 ENCOUNTER — Other Ambulatory Visit: Payer: Self-pay | Admitting: Cardiology

## 2023-02-16 DIAGNOSIS — Z8673 Personal history of transient ischemic attack (TIA), and cerebral infarction without residual deficits: Secondary | ICD-10-CM

## 2023-02-18 ENCOUNTER — Ambulatory Visit: Payer: BC Managed Care – PPO | Admitting: Cardiology

## 2023-03-16 DIAGNOSIS — R2231 Localized swelling, mass and lump, right upper limb: Secondary | ICD-10-CM | POA: Diagnosis not present

## 2023-03-16 DIAGNOSIS — M1811 Unilateral primary osteoarthritis of first carpometacarpal joint, right hand: Secondary | ICD-10-CM | POA: Diagnosis not present

## 2023-03-23 DIAGNOSIS — R1310 Dysphagia, unspecified: Secondary | ICD-10-CM | POA: Diagnosis not present

## 2023-03-23 DIAGNOSIS — R1013 Epigastric pain: Secondary | ICD-10-CM | POA: Diagnosis not present

## 2023-03-23 DIAGNOSIS — Z860101 Personal history of adenomatous and serrated colon polyps: Secondary | ICD-10-CM | POA: Diagnosis not present

## 2023-03-23 DIAGNOSIS — K219 Gastro-esophageal reflux disease without esophagitis: Secondary | ICD-10-CM | POA: Diagnosis not present

## 2023-04-06 ENCOUNTER — Telehealth: Payer: Self-pay | Admitting: Cardiology

## 2023-04-06 NOTE — Telephone Encounter (Signed)
Stat call from patient who states he is experiencing chest pain. Patient states he has been experiencing continuous chest pain for a week. Patient denies SOB and states it occurs at rest and with exertion. His last visit was 11/13/22 with Dr. Odis Hollingshead and at that time he did c/o intermittent CP. He states he does not check his HR or BP at home.  I had him check while he was on the phone with me and it was 72. He states his applewatch can also do EKG and it told him his heart rhythm was normal "nothing was detected".   I asked him if he had taken any nitroglycerin and he said he has never taken it because "I thought it was only for emergencies". I had him take one nitroglycerin with full resolution of his symptoms. He then reported that he is also having episodes of his heart racing/pounding, the most recent of which was last night when he was resting. He was not able to capture any BP/HR or EKG info at that time. He denies any leg swelling or weight gain.   I made him a DOD appt for 04/12/23 and advised on ED precautions in the meantime. Patient verbalizes understanding to call 911 if CP w/ or w/o SOB does not resolve with 3 doses of nitroglycerin taken 5 mins apart. Also advised that if he has episode of heart racing/pounding that does not resolve with rest, especially if accompanied by SOB and chest pain he needs to call 911. Patient verbalized understanding.

## 2023-04-06 NOTE — Telephone Encounter (Signed)
Agree.   Rex Kras, DO, [facility]

## 2023-04-06 NOTE — Telephone Encounter (Signed)
Pt c/o of Chest Pain: STAT if CP now or developed within 24 hours  1. Are you having CP right now? Yes   2. Are you experiencing any other symptoms (ex. SOB, nausea, vomiting, sweating)? No   3. How long have you been experiencing CP? For a week   4. Is your CP continuous or coming and going? Continuous   5. Have you taken Nitroglycerin? No  ?

## 2023-04-06 NOTE — Telephone Encounter (Signed)
Patient is requesting provider switch from Dr. Odis Hollingshead to Dr. Elease Hashimoto. He states Dr. Elease Hashimoto is his wife's cardiologist.

## 2023-04-06 NOTE — Telephone Encounter (Signed)
I am okay with that.   Betsy Rosello Gackle, DO, [facility]

## 2023-04-08 NOTE — Telephone Encounter (Signed)
Congrats Jesse Ho   I agree. Please update the patient and see what he wishes to do.   Tamikia Chowning Duquesne, DO, [facility]

## 2023-04-12 ENCOUNTER — Ambulatory Visit (INDEPENDENT_AMBULATORY_CARE_PROVIDER_SITE_OTHER): Payer: BC Managed Care – PPO

## 2023-04-12 ENCOUNTER — Encounter: Payer: Self-pay | Admitting: Cardiovascular Disease

## 2023-04-12 ENCOUNTER — Ambulatory Visit: Payer: BC Managed Care – PPO | Attending: Cardiovascular Disease | Admitting: Cardiovascular Disease

## 2023-04-12 VITALS — BP 112/82 | HR 89 | Ht 67.0 in | Wt 202.6 lb

## 2023-04-12 DIAGNOSIS — I2510 Atherosclerotic heart disease of native coronary artery without angina pectoris: Secondary | ICD-10-CM | POA: Diagnosis not present

## 2023-04-12 DIAGNOSIS — R002 Palpitations: Secondary | ICD-10-CM

## 2023-04-12 DIAGNOSIS — M79644 Pain in right finger(s): Secondary | ICD-10-CM | POA: Diagnosis not present

## 2023-04-12 DIAGNOSIS — I10 Essential (primary) hypertension: Secondary | ICD-10-CM | POA: Diagnosis not present

## 2023-04-12 MED ORDER — METOPROLOL TARTRATE 50 MG PO TABS: 50.0000 mg | ORAL_TABLET | Freq: Two times a day (BID) | ORAL | 3 refills | Status: DC

## 2023-04-12 MED ORDER — ROSUVASTATIN CALCIUM 10 MG PO TABS: 10.0000 mg | ORAL_TABLET | Freq: Every day | ORAL | 3 refills | Status: DC

## 2023-04-12 NOTE — Progress Notes (Signed)
Chief Complaint  Patient presents with   Follow-up    CAD    History of Present Illness: 47 yo male with history of CAD, HTN, HLD and prior TIA who is here today for follow up. He has been followed by Dr. Odis Hollingshead but is changing to my clinic today. Coronary CTA February 2024 with calcium score of 20 and mild plaque in the LAD. Echo February 2024 with normal LV systolic function and no significant valve disease. He was seen in June 2024 by Dr. Odis Hollingshead for chest pain that was felt to be non-cardiac.   He is here today for follow up. He has pain over the left chest wall that is sore to touch. No worsening with exertion. He also has palpitations every other night. He notices this while laying bed. No lower extremity edema, orthopnea, PND, dizziness, near syncope or syncope.   Primary Care Physician: Henrine Screws, MD   Past Medical History:  Diagnosis Date   Angina pectoris (HCC) 09/13/2018   Arthritis    played foot ball   Cervical dystonia    CVA (cerebral vascular accident) (HCC)    mini stroke   Essential hypertension 09/13/2018   Mild hyperlipidemia 09/13/2018   Palpitations 09/13/2018    Past Surgical History:  Procedure Laterality Date   CYSTECTOMY Left 10/19/2014   left branchial cleft cyst removed   FRACTURE SURGERY     fx fingers on left hand playing football   and bil L wrist    INSERTION OF MESH N/A 03/19/2017   Procedure: INSERTION OF MESH;  Surgeon: Gaynelle Adu, MD;  Location: [facility] ORS;  Service: General;  Laterality: N/A;   TEE WITHOUT CARDIOVERSION N/A 10/27/2016   Procedure: TRANSESOPHAGEAL ECHOCARDIOGRAM (TEE);  Surgeon: Yates Decamp, MD;  Location: [facility] ENDOSCOPY;  Service: Cardiovascular;  Laterality: N/A;   UMBILICAL HERNIA REPAIR N/A 03/19/2017   Procedure: LAPAROSCOPIC SUPRA UMBILICAL AND UMBILICAL HERNIA REPAIR WITH MESH;  Surgeon: Gaynelle Adu, MD;  Location: [facility] ORS;  Service: General;  Laterality: N/A;    Current Outpatient Medications  Medication Sig Dispense Refill    [facility] ASPIRIN LOW DOSE 81 MG tablet TAKE 1 TABLET (81 MG TOTAL) BY MOUTH DAILY. SWALLOW WHOLE. 90 tablet 3   metoprolol tartrate (LOPRESSOR) 50 MG tablet Take 1 tablet (50 mg total) by mouth 2 (two) times daily. 180 tablet 3   olmesartan-hydrochlorothiazide (BENICAR HCT) 40-25 MG tablet TAKE 1 TABLET BY MOUTH EVERY DAY 90 tablet 2   pantoprazole (PROTONIX) 40 MG tablet Take 1 tablet (40 mg total) by mouth daily. 30 tablet 11   rosuvastatin (CRESTOR) 10 MG tablet Take 1 tablet (10 mg total) by mouth daily. 90 tablet 3   nitroGLYCERIN (NITROSTAT) 0.4 MG SL tablet Place 1 tablet (0.4 mg total) under the tongue every 5 (five) minutes as needed for chest pain. If you require more than two tablets five minutes apart go to the nearest ER via EMS. 30 tablet 0   No current facility-administered medications for this visit.    Allergies  Allergen Reactions   Keflex [Cephalexin] Anaphylaxis    Social History   Socioeconomic History   Marital status: Married    Spouse name: Lowella Bandy   Number of children: 2   Years of education: 12+   Highest education level: Not on file  Occupational History    Comment: [employer], designs websites  Tobacco Use   Smoking status: Never   Smokeless tobacco: Never  Vaping Use   Vaping status: Never Used  Substance and Sexual Activity   Alcohol use: Yes    Comment: social   Drug use: No   Sexual activity: Yes  Other Topics Concern   Not on file  Social History Narrative   Lives at home w/ his wife and children   Right-handed   Caffeine: 3 cups of coffee/tea   Social Determinants of Health   Financial Resource Strain: Not on file  Food Insecurity: Not on file  Transportation Needs: Not on file  Physical Activity: Not on file  Stress: Not on file  Social Connections: Not on file  Intimate Partner Violence: Not on file    Family History  Problem Relation Age of Onset   Hyperlipidemia Other    Hypertension Other    Diabetes Other    Obesity Other     Diabetes Sister     Review of Systems:  As stated in the HPI and otherwise negative.   BP 112/82   Pulse 89   Ht 5\' 7"  (1.702 m)   Wt 91.9 kg   SpO2 95%   BMI 31.73 kg/m   Physical Examination: General: Well developed, well nourished, NAD  HEENT: OP clear, mucus membranes moist  SKIN: warm, dry. No rashes. Neuro: No focal deficits  Musculoskeletal: Muscle strength 5/5 all ext  Psychiatric: Mood and affect normal  Neck: No JVD, no carotid bruits, no thyromegaly, no lymphadenopathy.  Lungs:Clear bilaterally, no wheezes, rhonci, crackles Cardiovascular: Regular rate and rhythm. No murmurs, gallops or rubs. Abdomen:Soft. Bowel sounds present. Non-tender.  Extremities: No lower extremity edema. Pulses are 2 + in the bilateral DP/PT.  EKG:  EKG is ordered today. The ekg ordered today demonstrates  EKG Interpretation Date/Time:  Monday April 12 2023 16:23:25 EST Ventricular Rate:  82 PR Interval:  188 QRS Duration:  96 QT Interval:  354 QTC Calculation: 413 R Axis:   3  Text Interpretation: Normal sinus rhythm Moderate voltage criteria for LVH, may be normal variant ( R in aVL , Sokolow-Lyon ) ST Non-specific change in ST segment in lateral leads likely due to repolarization, unchanged from last EKG Confirmed by Verne Carrow [phone]) on 04/12/2023 4:44:21 PM    Recent Labs: 07/10/2022: ALT 28; BUN 15; Creatinine, Ser 1.18; Potassium 4.1; Sodium 140   Lipid Panel    Component Value Date/Time   CHOL 140 07/10/2022 1334   TRIG 129 07/10/2022 1334   HDL 29 (L) 07/10/2022 1334   CHOLHDL 6.0 10/20/2016 0447   VLDL 27 10/20/2016 0447   LDLCALC 88 07/10/2022 1334   LDLDIRECT 92 07/10/2022 1334     Wt Readings from Last 3 Encounters:  04/12/23 91.9 kg  11/13/22 90.7 kg  07/28/22 90.7 kg    Assessment and Plan:   1. CAD without angina: His chest pain is likely musculoskeletal. Given mild CAD seen on coronary CTA, will have him start Crestor 10 mg daily and continue  ASA 81 mg daily. Lipids and LFTs in 12 weeks.   2. HTN: BP has been elevated at home. Will increase Lopressor to 50 mg po BID. Continue Benicar/hydrochlorothiazide.   3. Palpitations: Will arrange 14 day cardiac monitor.   Labs/ tests ordered today include:  Orders Placed This Encounter  Procedures   Lipid Profile   Hepatic function panel   LONG TERM MONITOR (3-14 DAYS)   EKG 12-Lead   Disposition:   F/U with me in one year   Signed, Verne Carrow, MD, [facility] 04/12/2023 4:53 PM    [facility] [address] [place], [place], [place]  [address] Phone: [phone]; Fax: [phone]

## 2023-04-12 NOTE — Patient Instructions (Signed)
Medication Instructions:  Your physician has recommended you make the following change in your medication:  1.) increase Lopressor to 50 mg - one tablet twice a day 2.) start Crestor (rosuvastatin) 10 mg - one tablet daily  *If you need a refill on your cardiac medications before your next appointment, please call your pharmacy*   Lab Work: Please return for blood work in 3 months (lipids, liver)   Testing/Procedures: Jesse Ho- Long Term Monitor   Follow-Up: At [facility], you and your health needs are our priority.  As part of our continuing mission to provide you with exceptional heart care, we have created designated Provider Care Teams.  These Care Teams include your primary Cardiologist (physician) and Advanced Practice Providers (APPs -  Physician Assistants and Nurse Practitioners) who all work together to provide you with the care you need, when you need it.   Your next appointment:   12 month(s)  Provider:   Verne Carrow, MD  Other Instructions Jesse Ho- Long Term Monitor Instructions  Your physician has requested you wear a ZIO patch monitor for 14 days.  This is a single patch monitor. [employer] supplies one patch monitor per enrollment. Additional stickers are not available. Please do not apply patch if you will be having a Nuclear Stress Test,  Echocardiogram, Cardiac CT, MRI, or Chest Xray during the period you would be wearing the  monitor. The patch cannot be worn during these tests. You cannot remove and re-apply the  ZIO XT patch monitor.  Your ZIO patch monitor will be mailed 3 day USPS to your address on file. It may take 3-5 days  to receive your monitor after you have been enrolled.  Once you have received your monitor, please review the enclosed instructions. Your monitor  has already been registered assigning a specific monitor serial # to you.  Billing and Patient Assistance Program Information  We have supplied [employer] with any of your  insurance information on file for billing purposes. [employer] offers a sliding scale Patient Assistance Program for patients that do not have  insurance, or whose insurance does not completely cover the cost of the ZIO monitor.  You must apply for the Patient Assistance Program to qualify for this discounted rate.  To apply, please call [employer] at [phone], select option 4, select option 2, ask to apply for  Patient Assistance Program. Jesse Ho will ask your household income, and how many people  are in your household. They will quote your out-of-pocket cost based on that information.  [employer] will also be able to set up a 12-month, interest-free payment plan if needed.  Applying the monitor   Shave hair from upper left chest.  Hold abrader disc by orange tab. Rub abrader in 40 strokes over the upper left chest as  indicated in your monitor instructions.  Clean area with 4 enclosed alcohol pads. Let dry.  Apply patch as indicated in monitor instructions. Patch will be placed under collarbone on left  side of chest with arrow pointing upward.  Rub patch adhesive wings for 2 minutes. Remove white label marked "1". Remove the white  label marked "2". Rub patch adhesive wings for 2 additional minutes.  While looking in a mirror, press and release button in center of patch. A small green light will  flash 3-4 times. This will be your only indicator that the monitor has been turned on.  Do not shower for the first 24 hours. You may shower after the first 24 hours.  Press the button if you feel a symptom. You will hear a small click. Record Date, Time and  Symptom in the Patient Logbook.  When you are ready to remove the patch, follow instructions on the last 2 pages of Patient  Logbook. Stick patch monitor onto the last page of Patient Logbook.  Place Patient Logbook in the blue and white box. Use locking tab on box and tape box closed  securely. The blue and white box has prepaid postage on  it. Please place it in the mailbox as  soon as possible. Your physician should have your test results approximately 7 days after the  monitor has been mailed back to [facility].  Call [facility] Customer Care at [phone] if you have questions regarding  your ZIO XT patch monitor. Call them immediately if you see an orange light blinking on your  monitor.  If your monitor falls off in less than 4 days, contact our Monitor department at [phone].  If your monitor becomes loose or falls off after 4 days call [employer] at [phone] for  suggestions on securing your monitor

## 2023-04-12 NOTE — Progress Notes (Unsigned)
Enrolled for [employer] to mail a ZIO XT long term holter monitor to the patients address on file.  

## 2023-04-16 DIAGNOSIS — R002 Palpitations: Secondary | ICD-10-CM

## 2023-04-23 ENCOUNTER — Ambulatory Visit: Payer: BC Managed Care – PPO | Admitting: Nurse Practitioner

## 2023-05-03 DIAGNOSIS — K2930 Chronic superficial gastritis without bleeding: Secondary | ICD-10-CM | POA: Diagnosis not present

## 2023-05-11 ENCOUNTER — Telehealth: Payer: Self-pay | Admitting: *Deleted

## 2023-05-11 NOTE — Telephone Encounter (Signed)
-----   Message from Verne Carrow sent at 05/11/2023  3:48 PM EST ----- Palpitations likely due to early beats. Avoid stimulants. The early beats are rate. chris

## 2023-05-11 NOTE — Telephone Encounter (Signed)
Called patient w monitor results.  He voices understanding and is pleased to hear.  Will work to avoid things like caffeine, energy drinks, get adequate sleep, control stress.

## 2023-05-27 DIAGNOSIS — M79645 Pain in left finger(s): Secondary | ICD-10-CM | POA: Diagnosis not present

## 2023-06-18 DIAGNOSIS — M79645 Pain in left finger(s): Secondary | ICD-10-CM | POA: Diagnosis not present

## 2023-06-29 DIAGNOSIS — M79645 Pain in left finger(s): Secondary | ICD-10-CM | POA: Diagnosis not present

## 2023-07-21 DIAGNOSIS — Y999 Unspecified external cause status: Secondary | ICD-10-CM | POA: Diagnosis not present

## 2023-07-21 DIAGNOSIS — S63418A Traumatic rupture of collateral ligament of other finger at metacarpophalangeal and interphalangeal joint, initial encounter: Secondary | ICD-10-CM | POA: Diagnosis not present

## 2023-07-21 DIAGNOSIS — S63642A Sprain of metacarpophalangeal joint of left thumb, initial encounter: Secondary | ICD-10-CM | POA: Diagnosis not present

## 2023-07-21 DIAGNOSIS — X58XXXA Exposure to other specified factors, initial encounter: Secondary | ICD-10-CM | POA: Diagnosis not present

## 2023-07-29 DIAGNOSIS — S63642D Sprain of metacarpophalangeal joint of left thumb, subsequent encounter: Secondary | ICD-10-CM | POA: Diagnosis not present

## 2023-08-06 ENCOUNTER — Encounter: Payer: Self-pay | Admitting: Cardiovascular Disease

## 2023-08-06 DIAGNOSIS — I2510 Atherosclerotic heart disease of native coronary artery without angina pectoris: Secondary | ICD-10-CM

## 2023-08-26 DIAGNOSIS — S63642D Sprain of metacarpophalangeal joint of left thumb, subsequent encounter: Secondary | ICD-10-CM | POA: Diagnosis not present

## 2023-08-26 DIAGNOSIS — M79645 Pain in left finger(s): Secondary | ICD-10-CM | POA: Diagnosis not present

## 2023-09-02 DIAGNOSIS — M79645 Pain in left finger(s): Secondary | ICD-10-CM | POA: Diagnosis not present

## 2023-09-09 DIAGNOSIS — M79645 Pain in left finger(s): Secondary | ICD-10-CM | POA: Diagnosis not present

## 2023-09-16 DIAGNOSIS — M79645 Pain in left finger(s): Secondary | ICD-10-CM | POA: Diagnosis not present

## 2023-09-21 DIAGNOSIS — M79645 Pain in left finger(s): Secondary | ICD-10-CM | POA: Diagnosis not present

## 2023-09-27 NOTE — Progress Notes (Signed)
 Patient ID: Jesse Ho                 DOB: 05-08-1976                    MRN: [id number]     HPI: Jesse Ho is a 48 y.o. male patient referred to pharmacy clinic by Dr. Abel Hoe to initiate GLP1-RA therapy. PMH is significant for CAD w/ coronary calcium  score 20 and mild plaque in LAD, HTN, HLD, and prior TIA in 2018, and obesity. No history of diabetes, last A1C 4.9% in 2024. Most recent BMI 31.6.  Patient requested appointment to discuss weight loss options and how GLP-1 therapy may improve cholesterol, triglycerides, and A1C after seeing his wife have success with Zepbound.   Baseline weight and BMI: 202 lbs, 31.6 (ht 5'7") Current meds that affect weight: none  At today's visit:  - interested in weight loss? - health goals? - current diet/exercise?   Discussed mechanism of action of GLP-1s, benefits in weight loss and secondary cardiovascular event risk reduction, and expected GI side effects. Patient is eligible for coverage for Wegovy . Wegovy  is listed as a Tier 2 drug on [employer] [place]  plan - $0/month with mfr coupon w/ max $255/month taken off.   Diet:   Exercise:   Family History:  Mother Metallurgist)   Father (Deceased at age 35)   Sister Metallurgist) Diabetes    Other Diabetes  Hyperlipidemia  Hypertension  Obesity      Social History: Never smoker, no smokeless tobacco use. He reports drinking alcohol , does not use drugs.  Labs: Lab Results  Component Value Date   HGBA1C 4.9 05/26/2022    Wt Readings from Last 1 Encounters:  04/12/23 202 lb 9.6 oz (91.9 kg)    BP Readings from Last 1 Encounters:  04/12/23 112/82   Pulse Readings from Last 1 Encounters:  04/12/23 89       Component Value Date/Time   CHOL 140 07/10/2022 1334   TRIG 129 07/10/2022 1334   HDL 29 (L) 07/10/2022 1334   CHOLHDL 6.0 10/20/2016 0447   VLDL 27 10/20/2016 0447   LDLCALC 88 07/10/2022 1334   LDLDIRECT 92 07/10/2022 1334    Past Medical History:  Diagnosis Date    Angina pectoris (HCC) 09/13/2018   Arthritis    played foot ball   Cervical dystonia    CVA (cerebral vascular accident) (HCC)    mini stroke   Essential hypertension 09/13/2018   Mild hyperlipidemia 09/13/2018   Palpitations 09/13/2018    Current Outpatient Medications on File Prior to Visit  Medication Sig Dispense Refill   [facility]S ASPIRIN  LOW DOSE 81 MG tablet TAKE 1 TABLET (81 MG TOTAL) BY MOUTH DAILY. SWALLOW WHOLE. 90 tablet 3   metoprolol  tartrate (LOPRESSOR ) 50 MG tablet Take 1 tablet (50 mg total) by mouth 2 (two) times daily. 180 tablet 3   nitroGLYCERIN  (NITROSTAT ) 0.4 MG SL tablet Place 1 tablet (0.4 mg total) under the tongue every 5 (five) minutes as needed for chest pain. If you require more than two tablets five minutes apart go to the nearest ER via EMS. 30 tablet 0   olmesartan -hydrochlorothiazide (BENICAR  HCT) 40-25 MG tablet TAKE 1 TABLET BY MOUTH EVERY DAY 90 tablet 2   pantoprazole  (PROTONIX ) 40 MG tablet Take 1 tablet (40 mg total) by mouth daily. 30 tablet 11   rosuvastatin  (CRESTOR ) 10 MG tablet Take 1 tablet (10 mg total) by mouth daily. 90 tablet  3   No current facility-administered medications on file prior to visit.    Allergies  Allergen Reactions   Keflex [Cephalexin] Anaphylaxis     Assessment/Plan:  1. Weight loss - Patient has not met goal of at least 5% of body weight loss with comprehensive lifestyle modifications alone in the past 3-6 months. Pharmacotherapy is appropriate to pursue as augmentation. Will start Wegovy  0.25 mg [place] weekly, with plan to titrate up as tolerated monthly via phone check-in. Confirmed patient has no personal or family history of medullary thyroid  carcinoma (MTC) or Multiple Endocrine Neoplasia syndrome type 2 (MEN 2). Injection technique reviewed at today's visit.  Advised patient on common side effects including nausea, diarrhea, dyspepsia, decreased appetite, and fatigue. Counseled patient on reducing meal size and how to titrate  medication to minimize side effects. Counseled patient to call if intolerable side effects or if experiencing dehydration, abdominal pain, or dizziness. Patient will adhere to dietary modifications and will target at least 150 minutes of moderate intensity exercise weekly.   Follow up in 1 month via telephone for tolerability update and dose titration.   Jolyn Needles, PharmD Candidate 2025 [employer] [facility] Extern 09/28/2023

## 2023-09-28 ENCOUNTER — Telehealth: Payer: Self-pay | Admitting: Pharmacist

## 2023-09-28 ENCOUNTER — Ambulatory Visit: Attending: Cardiology | Admitting: Pharmacist

## 2023-09-28 ENCOUNTER — Other Ambulatory Visit (HOSPITAL_COMMUNITY): Payer: Self-pay

## 2023-09-28 ENCOUNTER — Encounter: Payer: Self-pay | Admitting: Pharmacist

## 2023-09-28 ENCOUNTER — Telehealth: Payer: Self-pay | Admitting: Pharmacy Technician

## 2023-09-28 MED ORDER — WEGOVY 0.25 MG/0.5ML ~~LOC~~ SOAJ: 0.2500 mg | SUBCUTANEOUS | 0 refills | Status: DC

## 2023-09-28 NOTE — Telephone Encounter (Signed)
 Pharmacy Patient Advocate Encounter   Received notification from Pt Calls Messages that prior authorization for Wegovy  is required/requested.   Insurance verification completed.   The patient is insured through  rx advance prescript  .   Per test claim: PA required; PA submitted to above mentioned insurance via CoverMyMeds Key/confirmation #/EOC [id number] Status is pending

## 2023-09-28 NOTE — Patient Instructions (Signed)
GLP1 Agonist Titration Plan:  Will plan to follow the titration plan as below, pending patient is tolerating each dose before increasing to the next. Can slow titration if needed for tolerability.   [place]:  -Month 1: Inject Wegovy 0.25 mg once weekly x 4 weeks -Month 2: Inject Wegovy 0.5 mg once weekly x 4 weeks -Month 3: Inject Wegovy 1 mg once weekly x 4 weeks -Month 4: Inject Wegovy 1.7mg SQ once weekly x 4 weeks -Month 5+: Inject Wegovy 2.4mg SQ once weekly     

## 2023-09-28 NOTE — Telephone Encounter (Signed)
 Pharmacy Patient Advocate Encounter  Received notification from  rx advance prescription  that Prior Authorization for wegovy  has been APPROVED from 09/28/23 to 04/25/24. Ran test claim, Copay is $0.00. This test claim was processed through [facility]- copay amounts may vary at other pharmacies due to pharmacy/plan contracts, or as the patient moves through the different stages of their insurance plan.

## 2023-09-28 NOTE — Telephone Encounter (Signed)
 PA approved see office encounter from the same date for more info

## 2023-09-28 NOTE — Progress Notes (Signed)
 Patient ID: Jesse Ho                 DOB: May 20, 1976                    MRN: [id number]     HPI: Jesse Ho is a 48 y.o. male patient referred to pharmacy clinic by Dr. Abel Hoe to initiate GLP1-RA therapy. PMH is significant for CAD w/ coronary calcium  score 20 and mild plaque in LAD, HTN, HLD, and prior TIA in 2018, and obesity. No history of diabetes, last A1C 4.9% in 2024. Most recent BMI 31.6.  Patient requested appointment to discuss weight loss options and how GLP-1 therapy may improve cholesterol, triglycerides, and A1C after seeing his wife have success with Zepbound.   Patient reported in past few years he has been gaining weight. He tried few dietary changes without success so he falls back to his bad diet habits. Him and his wife thinking to join gym and planing to do cardio at least 150-200 min per week with 2-3 days of strength training.    Baseline weight and BMI: 202 lbs, 31.6 (ht 5'7") Current meds that affect weight: none Goal weight: 170lbs    Discussed mechanism of action of GLP-1s, benefits in weight loss and secondary cardiovascular event risk reduction, and expected GI side effects. Patient is eligible for coverage for Wegovy . Wegovy  is listed as a Tier 2 drug on [employer] [place]  plan - $0/month with mfr coupon w/ max $255/month taken off.   Diet: was following good diet but lately it went back to more processed food  He is working on reducing carb/fat intake, reducing to eat out and lowering soda intake    Exercise: none    Family History:  Mother Metallurgist)   Father (Deceased at age 61)   Sister Metallurgist) Diabetes    Other Diabetes  Hyperlipidemia  Hypertension  Obesity      Social History: Never smoker, no smokeless tobacco use. He reports drinking alcohol  1 drink per week, does not use drugs.  Labs: Lab Results  Component Value Date   HGBA1C 4.9 05/26/2022    Wt Readings from Last 1 Encounters:  09/28/23 205 lb 6.4 oz (93.2 kg)    BP  Readings from Last 1 Encounters:  04/12/23 112/82   Pulse Readings from Last 1 Encounters:  04/12/23 89       Component Value Date/Time   CHOL 140 07/10/2022 1334   TRIG 129 07/10/2022 1334   HDL 29 (L) 07/10/2022 1334   CHOLHDL 6.0 10/20/2016 0447   VLDL 27 10/20/2016 0447   LDLCALC 88 07/10/2022 1334   LDLDIRECT 92 07/10/2022 1334    Past Medical History:  Diagnosis Date   Angina pectoris (HCC) 09/13/2018   Arthritis    played foot ball   Cervical dystonia    CVA (cerebral vascular accident) (HCC)    mini stroke   Essential hypertension 09/13/2018   Mild hyperlipidemia 09/13/2018   Palpitations 09/13/2018    Current Outpatient Medications on File Prior to Visit  Medication Sig Dispense Refill   [facility]S ASPIRIN  LOW DOSE 81 MG tablet TAKE 1 TABLET (81 MG TOTAL) BY MOUTH DAILY. SWALLOW WHOLE. 90 tablet 3   metoprolol  tartrate (LOPRESSOR ) 50 MG tablet Take 1 tablet (50 mg total) by mouth 2 (two) times daily. 180 tablet 3   nitroGLYCERIN  (NITROSTAT ) 0.4 MG SL tablet Place 1 tablet (0.4 mg total) under the tongue every 5 (five) minutes as  needed for chest pain. If you require more than two tablets five minutes apart go to the nearest ER via EMS. 30 tablet 0   olmesartan -hydrochlorothiazide (BENICAR  HCT) 40-25 MG tablet TAKE 1 TABLET BY MOUTH EVERY DAY 90 tablet 2   pantoprazole  (PROTONIX ) 40 MG tablet Take 1 tablet (40 mg total) by mouth daily. 30 tablet 11   rosuvastatin  (CRESTOR ) 10 MG tablet Take 1 tablet (10 mg total) by mouth daily. 90 tablet 3   No current facility-administered medications on file prior to visit.    Allergies  Allergen Reactions   Keflex [Cephalexin] Anaphylaxis     Assessment/Plan:  1. Weight loss - Patient has not met goal of at least 5% of body weight loss with comprehensive lifestyle modifications alone in the past 3-6 months. Pharmacotherapy is appropriate to pursue as augmentation. Will start Wegovy  0.25 mg [place] weekly, with plan to titrate up as  tolerated monthly via phone check-in. Confirmed patient has no personal or family history of medullary thyroid  carcinoma (MTC) or Multiple Endocrine Neoplasia syndrome type 2 (MEN 2). Injection technique reviewed at today's visit.  Advised patient on common side effects including nausea, diarrhea, dyspepsia, decreased appetite, and fatigue. Counseled patient on reducing meal size and how to titrate medication to minimize side effects. Counseled patient to call if intolerable side effects or if experiencing dehydration, abdominal pain, or dizziness. Patient will adhere to dietary modifications and will target at least 150 minutes of moderate intensity exercise weekly.   Follow up in 1 month via telephone for tolerability update and dose titration.   Nickola Baron, Pharm.D [facility] A Division of  [facility] [address] [place], [place] [address]  Phone: [phone]; Fax: [phone]

## 2023-09-30 LAB — HEPATIC FUNCTION PANEL
ALT: 28 IU/L (ref 0–44)
AST: 24 IU/L (ref 0–40)
Albumin: 4.7 g/dL (ref 4.1–5.1)
Alkaline Phosphatase: 86 IU/L (ref 44–121)
Bilirubin Total: 0.8 mg/dL (ref 0.0–1.2)
Bilirubin, Direct: 0.28 mg/dL (ref 0.00–0.40)
Total Protein: 7 g/dL (ref 6.0–8.5)

## 2023-09-30 LAB — LIPID PANEL
Chol/HDL Ratio: 7.2 ratio — ABNORMAL HIGH (ref 0.0–5.0)
Cholesterol, Total: 172 mg/dL (ref 100–199)
HDL: 24 mg/dL — ABNORMAL LOW (ref >39)
LDL Chol Calc (NIH): 91 mg/dL (ref 0–99)
Triglycerides: 340 mg/dL — ABNORMAL HIGH (ref 0–149)
VLDL Cholesterol Cal: 57 mg/dL — ABNORMAL HIGH (ref 5–40)

## 2023-10-04 ENCOUNTER — Telehealth: Payer: Self-pay | Admitting: Pharmacist

## 2023-10-04 ENCOUNTER — Other Ambulatory Visit (HOSPITAL_COMMUNITY): Payer: Self-pay

## 2023-10-04 ENCOUNTER — Telehealth: Payer: Self-pay | Admitting: Pharmacy Technician

## 2023-10-04 DIAGNOSIS — E785 Hyperlipidemia, unspecified: Secondary | ICD-10-CM

## 2023-10-04 NOTE — Telephone Encounter (Signed)
 Ran test claim for repatha. For a 28 day supply and the co-pay is 0.00 . PA is not needed at this time. Nothing saying this is a transition fill. This test claim was processed through [facility]- copay amounts may vary at other pharmacies due to pharmacy/plan contracts, or as the patient moves through the different stages of their insurance plan.

## 2023-10-04 NOTE — Telephone Encounter (Signed)
 We will assess coverage for PCSK9i - LDL is not at goal

## 2023-10-06 MED ORDER — ROSUVASTATIN CALCIUM 20 MG PO TABS: 20.0000 mg | ORAL_TABLET | Freq: Every day | ORAL | 3 refills | Status: DC

## 2023-10-06 NOTE — Telephone Encounter (Signed)
 Last lipid lab LDL 91 and TG 340, patient wants to wait and see if GLP1 would improve his lipid profile in 3 months however in agreement to up the Crestor  dose from 10 mg daily to 20 mg daily. F/u lab in 3 months- FLP and LFT

## 2023-10-08 MED ORDER — REPATHA SURECLICK 140 MG/ML ~~LOC~~ SOAJ
140.0000 mg | SUBCUTANEOUS | 3 refills | Status: AC
Start: 2023-10-08 — End: ?

## 2023-10-08 NOTE — Addendum Note (Signed)
 Addended by: Rabia Argote K on: 10/08/2023 01:33 PM   Modules accepted: Orders

## 2023-10-08 NOTE — Telephone Encounter (Signed)
 Patient made aware of Repatha addition to statin and prescription sent to preferred pharmacy. Lab due mid July.

## 2023-10-15 DIAGNOSIS — M79645 Pain in left finger(s): Secondary | ICD-10-CM | POA: Diagnosis not present

## 2023-10-18 ENCOUNTER — Encounter: Payer: Self-pay | Admitting: Cardiovascular Disease

## 2023-10-18 DIAGNOSIS — I2510 Atherosclerotic heart disease of native coronary artery without angina pectoris: Secondary | ICD-10-CM

## 2023-10-18 DIAGNOSIS — E785 Hyperlipidemia, unspecified: Secondary | ICD-10-CM

## 2023-10-18 DIAGNOSIS — Z8673 Personal history of transient ischemic attack (TIA), and cerebral infarction without residual deficits: Secondary | ICD-10-CM

## 2023-10-21 MED ORDER — WEGOVY 0.5 MG/0.5ML ~~LOC~~ SOAJ: 0.5000 mg | SUBCUTANEOUS | 0 refills | Status: DC

## 2023-11-12 DIAGNOSIS — M79645 Pain in left finger(s): Secondary | ICD-10-CM | POA: Diagnosis not present

## 2023-11-18 ENCOUNTER — Encounter: Payer: Self-pay | Admitting: Pharmacist

## 2023-11-18 MED ORDER — WEGOVY 1 MG/0.5ML ~~LOC~~ SOAJ: 1.0000 mg | SUBCUTANEOUS | 0 refills | Status: DC

## 2023-11-18 NOTE — Telephone Encounter (Signed)
 Erroneous encounter

## 2023-11-18 NOTE — Addendum Note (Signed)
 Addended by: Sunny English on: 11/18/2023 09:04 AM   Modules accepted: Orders

## 2023-12-07 DIAGNOSIS — S63642D Sprain of metacarpophalangeal joint of left thumb, subsequent encounter: Secondary | ICD-10-CM | POA: Diagnosis not present

## 2023-12-15 ENCOUNTER — Telehealth: Payer: Self-pay | Admitting: Pharmacist

## 2023-12-15 MED ORDER — WEGOVY 1.7 MG/0.75ML ~~LOC~~ SOAJ: 1.7000 mg | SUBCUTANEOUS | 0 refills | Status: DC

## 2023-12-15 NOTE — Telephone Encounter (Signed)
 Spoke to pt tolerates Wegovy  1 mg dose well without any side effects. Will up the dose to 1.7 mg once week. Follows healthy diet and does exercise mainly cardio no resistance exercise. Encourage to start doing resistance exercise along with cardio.

## 2023-12-22 DIAGNOSIS — R140 Abdominal distension (gaseous): Secondary | ICD-10-CM | POA: Diagnosis not present

## 2023-12-22 DIAGNOSIS — R1310 Dysphagia, unspecified: Secondary | ICD-10-CM | POA: Diagnosis not present

## 2023-12-22 DIAGNOSIS — K5909 Other constipation: Secondary | ICD-10-CM | POA: Diagnosis not present

## 2023-12-30 ENCOUNTER — Telehealth: Payer: Self-pay | Admitting: Cardiovascular Disease

## 2023-12-30 NOTE — Telephone Encounter (Signed)
  Per MyChart scheduling message:  Pt c/o of Chest Pain: STAT if active (IN THIS MOMENT) CP, including tightness, pressure, jaw pain, shoulder/upper arm/back pain, SOB, nausea, and vomiting.  1. Are you having CP right now (tightness, pressure, or discomfort)?   2. Are you experiencing any other symptoms (ex. SOB, nausea, vomiting, sweating)?   3. How long have you been experiencing CP?   4. Is your CP continuous or coming and going?   5. Have you taken Nitroglycerin ?   6. If CP returns before callback, please consider calling 911. ?   Patient response to questions:  The area on the left side chest (around my hart) has been hiring and stinging at times. Having chest discomfort

## 2023-12-30 NOTE — Telephone Encounter (Signed)
 Pt reports that earlier this week-pain on left side of chest that comes and goes, like a stinging pain-  some pain and pressure: yesterday it was worse when the pt takes a breath in.  Today no pain at all Sleeping on left side makes it sore/pressure. And also tenderness when he touches the area.   This started last Friday- it was just a couple of times a day for about 15 minutes, then it would go away, then come back- on and off. Happens with and without activity.   Thought about taking a NTG yesterday, but did not.   No sob, no dizziness, no nausea, no coughing, no sweating.   BP- around 120-130/80's  and pulse around 68.  Informed him that he can take a NTG if needed. Also, that I would message his provider to see what his recommendations are.   Offered an appt on 7/29 with PA, but he cannot make it that day. There is not much availability any time soon. Will ask provider if he would like to work in pt for appointment.

## 2024-01-03 NOTE — Telephone Encounter (Signed)
 Left message for patient to call back

## 2024-01-07 ENCOUNTER — Ambulatory Visit: Payer: Self-pay | Admitting: Pharmacist

## 2024-01-07 LAB — LIPID PANEL
Chol/HDL Ratio: 4.6 ratio (ref 0.0–5.0)
Cholesterol, Total: 119 mg/dL (ref 100–199)
HDL: 26 mg/dL — ABNORMAL LOW (ref >39)
LDL Chol Calc (NIH): 76 mg/dL (ref 0–99)
Triglycerides: 88 mg/dL (ref 0–149)
VLDL Cholesterol Cal: 17 mg/dL (ref 5–40)

## 2024-01-07 NOTE — Telephone Encounter (Signed)
 LDL remains above target. Patient reports having received only two injections of PCSK9 inhibitor (Repatha ) to date. Plan to repeat lipid panel at the end of October to assess full therapeutic response, as maximal LDL-lowering effect may not be evident yet.  Patient experienced mild flu-like symptoms after initial two doses. Advised patient to continue therapy, as such side effects are generally transient and tend to lessen with continued use.

## 2024-01-11 ENCOUNTER — Other Ambulatory Visit: Payer: Self-pay | Admitting: Cardiovascular Disease

## 2024-01-27 NOTE — Telephone Encounter (Signed)
 Reached patient by phone today.  He reports that he continues to get this left sided chest pain.  He said not experiencing now but probably later today I'll feel it.   He is able to reproduce pain with pressing on the area and also feels it with a deep breath.  He denies SOB.   Discussed that cardiac pain typically is not able to be reproduced w palpation or inspiration.  He said that it also sometimes goes into the left arm.  Again, felt this pain while pressing on it while on the phone.   Offered appointment today with Dr. Verlin, pt declined.  We set up his next visit for Oct per the recall.  He will call if needs anything sooner.

## 2024-02-08 DIAGNOSIS — S63642D Sprain of metacarpophalangeal joint of left thumb, subsequent encounter: Secondary | ICD-10-CM | POA: Diagnosis not present

## 2024-02-28 DIAGNOSIS — X58XXXD Exposure to other specified factors, subsequent encounter: Secondary | ICD-10-CM | POA: Diagnosis not present

## 2024-02-28 DIAGNOSIS — S5330XD Traumatic rupture of unspecified ulnar collateral ligament, subsequent encounter: Secondary | ICD-10-CM | POA: Diagnosis not present

## 2024-03-28 ENCOUNTER — Encounter: Payer: Self-pay | Admitting: Cardiovascular Disease

## 2024-03-28 ENCOUNTER — Ambulatory Visit: Attending: Cardiovascular Disease | Admitting: Cardiovascular Disease

## 2024-03-28 VITALS — BP 130/88 | HR 67 | Ht 66.0 in | Wt 180.0 lb

## 2024-03-28 DIAGNOSIS — R002 Palpitations: Secondary | ICD-10-CM | POA: Diagnosis not present

## 2024-03-28 DIAGNOSIS — I2510 Atherosclerotic heart disease of native coronary artery without angina pectoris: Secondary | ICD-10-CM

## 2024-03-28 DIAGNOSIS — I10 Essential (primary) hypertension: Secondary | ICD-10-CM | POA: Diagnosis not present

## 2024-03-28 DIAGNOSIS — E7800 Pure hypercholesterolemia, unspecified: Secondary | ICD-10-CM | POA: Diagnosis not present

## 2024-03-28 NOTE — Patient Instructions (Signed)

## 2024-03-28 NOTE — Progress Notes (Signed)
 Chief Complaint  Patient presents with   Follow-up    CAD    History of Present Illness: 48 yo male with history of CAD, HTN, HLD, PACs, PVCs and prior TIA who is here today for follow up. He had been followed by Dr. Michele. Coronary CTA February 2024 with calcium  score of 20 and mild plaque in the LAD. Echo February 2024 with normal LV systolic function and no significant valve disease. He was seen in November 2024 and he described left chest wall soreness to touch and palpitations. Cardiac monitor November 2024 with sinus rhythm and rare PACs/PVCs. His chest pain was not felt to be cardiac.   He is here today for follow up. The patient denies any chest pain, dyspnea, palpitations, lower extremity edema, orthopnea, PND, dizziness, near syncope or syncope.   Primary Care Physician: Frederik Charleston, MD   Past Medical History:  Diagnosis Date   Angina pectoris 09/13/2018   Arthritis    played foot ball   Cervical dystonia    CVA (cerebral vascular accident) [facility])    mini stroke   Essential hypertension 09/13/2018   Mild hyperlipidemia 09/13/2018   Palpitations 09/13/2018    Past Surgical History:  Procedure Laterality Date   CYSTECTOMY Left 10/19/2014   left branchial cleft cyst removed   FRACTURE SURGERY     fx fingers on left hand playing football   and bil L wrist    INSERTION OF MESH N/A 03/19/2017   Procedure: INSERTION OF MESH;  Surgeon: Tanda Locus, MD;  Location: [facility] ORS;  Service: General;  Laterality: N/A;   TEE WITHOUT CARDIOVERSION N/A 10/27/2016   Procedure: TRANSESOPHAGEAL ECHOCARDIOGRAM (TEE);  Surgeon: Ladona Heinz, MD;  Location: [facility] ENDOSCOPY;  Service: Cardiovascular;  Laterality: N/A;   UMBILICAL HERNIA REPAIR N/A 03/19/2017   Procedure: LAPAROSCOPIC SUPRA UMBILICAL AND UMBILICAL HERNIA REPAIR WITH MESH;  Surgeon: Tanda Locus, MD;  Location: [facility];  Service: General;  Laterality: N/A;    Current Outpatient Medications  Medication Sig Dispense Refill   [facility] ASPIRIN   LOW DOSE 81 MG tablet TAKE 1 TABLET (81 MG TOTAL) BY MOUTH DAILY. SWALLOW WHOLE. 90 tablet 3   Evolocumab  (REPATHA  SURECLICK) 140 MG/ML SOAJ Inject 140 mg into the skin every 14 (fourteen) days. 6 mL 3   LINZESS 145 MCG CAPS capsule Take 145 mcg by mouth every morning.     metoprolol  tartrate (LOPRESSOR ) 50 MG tablet Take 1 tablet (50 mg total) by mouth 2 (two) times daily. 180 tablet 3   nitroGLYCERIN  (NITROSTAT ) 0.4 MG SL tablet Place 1 tablet (0.4 mg total) under the tongue every 5 (five) minutes as needed for chest pain. If you require more than two tablets five minutes apart go to the nearest ER via EMS. 30 tablet 0   olmesartan -hydrochlorothiazide (BENICAR  HCT) 40-25 MG tablet TAKE 1 TABLET BY MOUTH EVERY DAY 90 tablet 2   rosuvastatin  (CRESTOR ) 20 MG tablet Take 1 tablet (20 mg total) by mouth daily. 90 tablet 3   Semaglutide -Weight Management (WEGOVY ) 2.4 MG/0.75ML SOAJ Inject 2.4 mg into the skin once a week. 3 mL 11   pantoprazole  (PROTONIX ) 40 MG tablet Take 1 tablet (40 mg total) by mouth daily. (Patient not taking: Reported on 03/28/2024) 30 tablet 11   No current facility-administered medications for this visit.    Allergies  Allergen Reactions   Keflex [Cephalexin] Anaphylaxis    Social History   Socioeconomic History   Marital status: Married    Spouse name: Levon  Number of children: 2   Years of education: 12+   Highest education level: Not on file  Occupational History    Comment: [employer], designs websites  Tobacco Use   Smoking status: Never   Smokeless tobacco: Never  Vaping Use   Vaping status: Never Used  Substance and Sexual Activity   Alcohol  use: Yes    Comment: social   Drug use: No   Sexual activity: Yes  Other Topics Concern   Not on file  Social History Narrative   Lives at home w/ his wife and children   Right-handed   Caffeine: 3 cups of coffee/tea   Social Drivers of Corporate investment banker Strain: Low Risk  (02/28/2024)   Received  from [facility]   Overall Financial Resource Strain (CARDIA)    Difficulty of Paying Living Expenses: Not hard at all  Food Insecurity: No Food Insecurity (02/28/2024)   Received from [facility]   Hunger Vital Sign    Within the past 12 months, you worried that your food would run out before you got the money to buy more.: Never true    Within the past 12 months, the food you bought just didn't last and you didn't have money to get more.: Never true  Transportation Needs: No Transportation Needs (02/28/2024)   Received from [facility] - Transportation    In the past 12 months, has lack of transportation kept you from medical appointments or from getting medications?: No    Lack of Transportation (Non-Medical): No  Physical Activity: Not on file  Stress: Not on file  Social Connections: Not on file  Intimate Partner Violence: Not on file    Family History  Problem Relation Age of Onset   Hyperlipidemia Other    Hypertension Other    Diabetes Other    Obesity Other    Diabetes Sister     Review of Systems:  As stated in the HPI and otherwise negative.   BP 130/88 (BP Location: Right Arm, Patient Position: Sitting, Cuff Size: Large)   Pulse 67   Ht 5' 6 (1.676 m)   Wt 180 lb (81.6 kg)   SpO2 96%   BMI 29.05 kg/m   Physical Examination: General: Well developed, well nourished, NAD  HEENT: OP clear, mucus membranes moist  SKIN: warm, dry. No rashes. Neuro: No focal deficits  Musculoskeletal: Muscle strength 5/5 all ext  Psychiatric: Mood and affect normal  Neck: No JVD, no carotid bruits, no thyromegaly, no lymphadenopathy.  Lungs:Clear bilaterally, no wheezes, rhonci, crackles Cardiovascular: Regular rate and rhythm. No murmurs, gallops or rubs. Abdomen:Soft. Bowel sounds present. Non-tender.  Extremities: No lower extremity edema. Pulses are 2 + in the bilateral DP/PT.  EKG:  EKG is ordered today. The  ekg ordered today demonstrates  EKG Interpretation Date/Time:  Tuesday March 28 2024 10:20:44 EDT Ventricular Rate:  67 PR Interval:  196 QRS Duration:  94 QT Interval:  384 QTC Calculation: 405 R Axis:   8  Text Interpretation: Normal sinus rhythm Moderate voltage criteria for LVH, may be normal variant ( R in aVL , Sokolow-Lyon ) Nonspecific ST and T wave abnormality Confirmed by Verlin Bruckner [phone]) on 03/28/2024 10:27:04 AM   Recent Labs: 09/29/2023: ALT 28   Lipid Panel    Component Value Date/Time   CHOL 119 01/06/2024 0934   TRIG 88 01/06/2024 0934   HDL 26 (L) 01/06/2024 0934   CHOLHDL 4.6  01/06/2024 0934   CHOLHDL 6.0 10/20/2016 0447   VLDL 27 10/20/2016 0447   LDLCALC 76 01/06/2024 0934   LDLDIRECT 92 07/10/2022 1334    Wt Readings from Last 3 Encounters:  03/28/24 180 lb (81.6 kg)  09/28/23 205 lb 6.4 oz (93.2 kg)  04/12/23 202 lb 9.6 oz (91.9 kg)    Assessment and Plan:   1. CAD without angina: No chest pain. Continue ASA and Crestor .    2. HTN: BP is controlled. Continue Lopressor  and Benicar /hydrochlorothiazide.    3. Hyperlipidemia: LDL near goal on Crestor  and Repatha .   Labs/ tests ordered today include:  Orders Placed This Encounter  Procedures   EKG 12-Lead   Disposition:   F/U with me in one year   Signed, Lonni Cash, MD, [facility] 03/28/2024 10:45 AM    [facility] [address] [place], [place], [place]  [address] Phone: [phone]; Fax: [phone]

## 2024-04-02 DIAGNOSIS — S6010XA Contusion of unspecified finger with damage to nail, initial encounter: Secondary | ICD-10-CM | POA: Diagnosis not present

## 2024-04-03 ENCOUNTER — Telehealth: Payer: Self-pay | Admitting: Pharmacist

## 2024-04-03 DIAGNOSIS — E785 Hyperlipidemia, unspecified: Secondary | ICD-10-CM

## 2024-04-03 NOTE — Telephone Encounter (Signed)
 Call to remind pt about f/u lipid lab. N/A MyChart sent

## 2024-04-05 NOTE — Telephone Encounter (Signed)
 Pt is out of town till Nov 9,2025 will get lab checked on Nov 10,2025

## 2024-04-09 ENCOUNTER — Other Ambulatory Visit: Payer: Self-pay | Admitting: Cardiovascular Disease

## 2024-04-11 MED ORDER — ROSUVASTATIN CALCIUM 20 MG PO TABS: 20.0000 mg | ORAL_TABLET | Freq: Every day | ORAL | 3 refills | Status: AC

## 2024-05-23 LAB — LIPID PANEL
Chol/HDL Ratio: 2.6 ratio (ref 0.0–5.0)
Cholesterol, Total: 87 mg/dL — ABNORMAL LOW (ref 100–199)
HDL: 34 mg/dL — ABNORMAL LOW (ref >39)
LDL Chol Calc (NIH): 38 mg/dL (ref 0–99)
Triglycerides: 68 mg/dL (ref 0–149)
VLDL Cholesterol Cal: 15 mg/dL (ref 5–40)

## 2024-05-25 ENCOUNTER — Other Ambulatory Visit (HOSPITAL_COMMUNITY): Payer: Self-pay

## 2024-05-25 ENCOUNTER — Telehealth: Payer: Self-pay | Admitting: Pharmacy Technician

## 2024-05-25 NOTE — Telephone Encounter (Signed)
 Pharmacy Patient Advocate Encounter   Received notification from CoverMyMeds that prior authorization for wegovy  2.4 MG is required/requested.   Insurance verification completed.   The patient is insured through [employer].   Per test claim: PA required; PA submitted to above mentioned insurance via Latent Key/confirmation #/EOC [id number] Status is pending

## 2024-05-25 NOTE — Telephone Encounter (Signed)
 Pharmacy Patient Advocate Encounter  Received notification from [employer] that Prior Authorization for wegovy  has been APPROVED from 05/25/24 to 05/25/25   PA #/Case ID/Reference #: [id number]

## 2024-05-30 DIAGNOSIS — R0789 Other chest pain: Secondary | ICD-10-CM | POA: Diagnosis not present

## 2024-05-30 DIAGNOSIS — F5104 Psychophysiologic insomnia: Secondary | ICD-10-CM | POA: Diagnosis not present

## 2024-05-30 DIAGNOSIS — M25562 Pain in left knee: Secondary | ICD-10-CM | POA: Diagnosis not present

## 2024-05-30 DIAGNOSIS — Z0001 Encounter for general adult medical examination with abnormal findings: Secondary | ICD-10-CM | POA: Diagnosis not present

## 2024-05-30 DIAGNOSIS — Z125 Encounter for screening for malignant neoplasm of prostate: Secondary | ICD-10-CM | POA: Diagnosis not present

## 2024-05-30 DIAGNOSIS — Z8673 Personal history of transient ischemic attack (TIA), and cerebral infarction without residual deficits: Secondary | ICD-10-CM | POA: Diagnosis not present

## 2024-05-30 DIAGNOSIS — R5382 Chronic fatigue, unspecified: Secondary | ICD-10-CM | POA: Diagnosis not present

## 2024-05-30 DIAGNOSIS — E559 Vitamin D deficiency, unspecified: Secondary | ICD-10-CM | POA: Diagnosis not present

## 2024-06-12 ENCOUNTER — Ambulatory Visit: Payer: Self-pay | Admitting: Pharmacist
